# Patient Record
Sex: Female | Born: 1970 | Race: White | Hispanic: No | Smoking: Never smoker
Health system: Southern US, Community
[De-identification: ages and names within clinical notes are randomized; demographics above are authoritative.]

## PROBLEM LIST (undated history)

## (undated) DIAGNOSIS — E119 Type 2 diabetes mellitus without complications: Secondary | ICD-10-CM

## (undated) DIAGNOSIS — I10 Essential (primary) hypertension: Secondary | ICD-10-CM

## (undated) DIAGNOSIS — C140 Malignant neoplasm of pharynx, unspecified: Secondary | ICD-10-CM

## (undated) DIAGNOSIS — F419 Anxiety disorder, unspecified: Secondary | ICD-10-CM

## (undated) DIAGNOSIS — F329 Major depressive disorder, single episode, unspecified: Secondary | ICD-10-CM

## (undated) DIAGNOSIS — D499 Neoplasm of unspecified behavior of unspecified site: Secondary | ICD-10-CM

## (undated) DIAGNOSIS — F32A Depression, unspecified: Secondary | ICD-10-CM

## (undated) DIAGNOSIS — J45909 Unspecified asthma, uncomplicated: Secondary | ICD-10-CM

## (undated) HISTORY — DX: Neoplasm of unspecified behavior of unspecified site: D49.9

## (undated) HISTORY — DX: Essential (primary) hypertension: I10

## (undated) HISTORY — DX: Type 2 diabetes mellitus without complications: E11.9

## (undated) HISTORY — PX: TUBAL LIGATION: SHX77

## (undated) HISTORY — PX: CHOLECYSTECTOMY: SHX55

## (undated) HISTORY — DX: Malignant neoplasm of pharynx, unspecified: C14.0

---

## 2010-11-06 ENCOUNTER — Encounter: Payer: Self-pay | Admitting: *Deleted

## 2010-11-06 ENCOUNTER — Emergency Department (HOSPITAL_COMMUNITY)
Admission: EM | Admit: 2010-11-06 | Discharge: 2010-11-07 | Disposition: A | Discharge status: Home / Self Care | Payer: PRIVATE HEALTH INSURANCE | Attending: Emergency Medicine | Admitting: Emergency Medicine

## 2010-11-06 DIAGNOSIS — F329 Major depressive disorder, single episode, unspecified: Secondary | ICD-10-CM

## 2010-11-06 DIAGNOSIS — F3289 Other specified depressive episodes: Secondary | ICD-10-CM | POA: Insufficient documentation

## 2010-11-06 HISTORY — DX: Depression, unspecified: F32.A

## 2010-11-06 HISTORY — DX: Anxiety disorder, unspecified: F41.9

## 2010-11-06 HISTORY — DX: Major depressive disorder, single episode, unspecified: F32.9

## 2010-11-06 NOTE — ED Notes (Signed)
Pt states she has depression and hasn't been taking her medications. Pt states she cries all the time and thinks about suicide. When asked if pt had a plan, she said she would overdose on pills or jump out of the car while it was going down the road.

## 2010-11-06 NOTE — ED Notes (Signed)
Security wanded pt and pt's friend, pt not able to change clothes into paper scrubs due to pt's size, sitter at bedside

## 2010-11-06 NOTE — ED Notes (Signed)
Pt. Stopped taking her meds, pt states "does not feel like it" when asked why pt stopped her meds; states "crazy thoughts"; pt's plan for suicide is "take overdose or throw myself into traffic", pt has hx anxiety, depression and suicide attempt; pt. Admits to having " a lot of stress" and pt's friend states pt with recent break up with her boyfriend

## 2010-11-07 ENCOUNTER — Encounter (HOSPITAL_COMMUNITY): Payer: Self-pay | Admitting: *Deleted

## 2010-11-07 DIAGNOSIS — F329 Major depressive disorder, single episode, unspecified: Secondary | ICD-10-CM | POA: Insufficient documentation

## 2010-11-07 DIAGNOSIS — F332 Major depressive disorder, recurrent severe without psychotic features: Secondary | ICD-10-CM | POA: Insufficient documentation

## 2010-11-07 LAB — CBC
Platelets: 256 10*3/uL (ref 150–400)
RBC: 4.2 MIL/uL (ref 3.87–5.11)
WBC: 8.9 10*3/uL (ref 4.0–10.5)

## 2010-11-07 LAB — DIFFERENTIAL
Eosinophils Absolute: 0.2 10*3/uL (ref 0.0–0.7)
Lymphocytes Relative: 34 % (ref 12–46)
Lymphs Abs: 3 10*3/uL (ref 0.7–4.0)
Neutro Abs: 5.3 10*3/uL (ref 1.7–7.7)
Neutrophils Relative %: 59 % (ref 43–77)

## 2010-11-07 LAB — BASIC METABOLIC PANEL
CO2: 26 mEq/L (ref 19–32)
GFR calc non Af Amer: >60 mL/min (ref >60)
Glucose, Bld: 135 mg/dL — ABNORMAL HIGH (ref 70–99)
Potassium: 3.6 mEq/L (ref 3.5–5.1)
Sodium: 137 mEq/L (ref 135–145)

## 2010-11-07 LAB — RAPID URINE DRUG SCREEN, HOSP PERFORMED
Barbiturates: NOT DETECTED
Benzodiazepines: NOT DETECTED
Cocaine: NOT DETECTED

## 2010-11-07 NOTE — ED Provider Notes (Signed)
Pt seen by [facility] and evaluated by Dr. Henderson Cloud with [facility]. He feels the patient is safe for discharge and we can reverse her commitment papers. Will refill Klonipin for short term and advise outpatient followup.   Charles B. Bernette Mayers, MD 11/07/10 1124

## 2010-11-07 NOTE — ED Notes (Signed)
Patient asked for a cup of ice water 

## 2010-11-07 NOTE — ED Notes (Signed)
Pt having [facility] psych consult at present

## 2010-11-07 NOTE — ED Notes (Signed)
Pt to be discharged home past IVC papers refersed

## 2010-11-07 NOTE — ED Notes (Signed)
Patient was given toothbrush, toothpaste comb and soap

## 2010-11-07 NOTE — ED Notes (Signed)
Pt has family members that would like to know where pt is being transferred too; [facility] [phone] and [facility] [phone]

## 2010-11-07 NOTE — ED Notes (Signed)
Pt. Insisting on leaving, informed pt that she can not leave and that we have commitment papers on her, pt states "ya'll don't understand, I have to go to work in the morning"; pt explained again that she can not leave due to her being suicidual

## 2010-11-07 NOTE — ED Notes (Signed)
Info faxed to soc for [facility] psych

## 2010-11-07 NOTE — ED Provider Notes (Signed)
History     Chief Complaint  Patient presents with  . Psychiatric Evaluation    Pt feels depressed. Pt states she cries all of the time. Pt states she has depression and hasn't been taking her meds. Pt just started back taking her psych meds.   HPI Comments: Pt has had problems c depression for long time;  Stopped her rx for Klonopin one week ago  Patient is a 40 y.o. female presenting with altered mental status. The history is provided by the patient.  Altered Mental Status This is a chronic problem. The current episode started more than 1 week ago. The problem occurs constantly. The problem has been gradually worsening. The symptoms are aggravated by nothing. The symptoms are relieved by nothing. She has tried nothing for the symptoms.    Past Medical History  Diagnosis Date  . Depression     Pt has had suicide ideations in the past.  . Herpes simplex   . Anxiety     Past Surgical History  Procedure Date  . Cholecystectomy   . Tubal ligation     Family History  Problem Relation Age of Onset  . Hypertension Other   . Diabetes Daughter   . Asthma Daughter     History  Substance Use Topics  . Smoking status: Never Smoker   . Smokeless tobacco: Not on file  . Alcohol Use: No    OB History    Grav Para Term Preterm Abortions TAB SAB Ect Mult Living   2 2              Review of Systems  Psychiatric/Behavioral: Positive for altered mental status.  All other systems reviewed and are negative.    Physical Exam  BP 149/74  Pulse 77  Temp(Src) 98.1 F (36.7 C) (Oral)  Resp 20  Ht 5\' 4"  (1.626 m)  Wt 320 lb (145.151 kg)  BMI 54.93 kg/m2  SpO2 99%  LMP 10/31/2010  Physical Exam  Constitutional: She is oriented to person, place, and time. She appears well-developed and well-nourished.  HENT:  Head: Normocephalic and atraumatic.  Eyes: Conjunctivae and EOM are normal. Pupils are equal, round, and reactive to light.  Neck: Normal range of motion. Neck supple.   Cardiovascular: Normal rate and regular rhythm.   Pulmonary/Chest: Effort normal and breath sounds normal.  Abdominal: Soft. Bowel sounds are normal.  Musculoskeletal: Normal range of motion.  Neurological: She is alert and oriented to person, place, and time.  Skin: Skin is warm and dry.  Psychiatric: Her speech is normal. Judgment and thought content normal. Her mood appears anxious. Cognition and memory are normal. She exhibits a depressed mood. She is inattentive.    ED Course  Procedures  Labs, IVC paperwork, [facility] consult     Donnetta Hutching, MD 11/07/10 [phone]

## 2010-11-07 NOTE — ED Notes (Signed)
Sitter remains at bedside

## 2010-11-07 NOTE — ED Notes (Signed)
Pt being eval by [facility] 

## 2010-11-07 NOTE — ED Notes (Signed)
Patient needed to [place]  the bathroom. Offered something to drink

## 2010-11-07 NOTE — ED Notes (Signed)
Pt wanting to leave AMA, EDP notified and [facility] informed and at bedside, commitment papers filled and signed, [place] [employer] called due to pt wanting to leave and that pt is form [place]

## 2010-11-07 NOTE — ED Notes (Signed)
Pt waiting to be eval by [facility]. Sitter at bedside

## 2010-11-09 ENCOUNTER — Inpatient Hospital Stay (HOSPITAL_COMMUNITY)
Admission: RE | Admit: 2010-11-09 | Discharge: 2010-11-12 | DRG: 885 | Disposition: A | Discharge status: Home / Self Care | Payer: PRIVATE HEALTH INSURANCE | Attending: Psychiatry | Admitting: Psychiatry

## 2010-11-09 DIAGNOSIS — IMO0002 Reserved for concepts with insufficient information to code with codable children: Secondary | ICD-10-CM

## 2010-11-09 DIAGNOSIS — F411 Generalized anxiety disorder: Secondary | ICD-10-CM

## 2010-11-09 DIAGNOSIS — Z818 Family history of other mental and behavioral disorders: Secondary | ICD-10-CM

## 2010-11-09 DIAGNOSIS — N159 Renal tubulo-interstitial disease, unspecified: Secondary | ICD-10-CM

## 2010-11-09 DIAGNOSIS — Z6379 Other stressful life events affecting family and household: Secondary | ICD-10-CM

## 2010-11-09 DIAGNOSIS — K219 Gastro-esophageal reflux disease without esophagitis: Secondary | ICD-10-CM

## 2010-11-09 DIAGNOSIS — R45851 Suicidal ideations: Secondary | ICD-10-CM

## 2010-11-09 DIAGNOSIS — G8929 Other chronic pain: Secondary | ICD-10-CM

## 2010-11-09 DIAGNOSIS — F332 Major depressive disorder, recurrent severe without psychotic features: Principal | ICD-10-CM

## 2010-11-09 DIAGNOSIS — M25569 Pain in unspecified knee: Secondary | ICD-10-CM

## 2010-11-10 DIAGNOSIS — F339 Major depressive disorder, recurrent, unspecified: Secondary | ICD-10-CM

## 2010-11-10 DIAGNOSIS — F411 Generalized anxiety disorder: Secondary | ICD-10-CM

## 2010-11-10 LAB — URINALYSIS, ROUTINE W REFLEX MICROSCOPIC
Bilirubin Urine: NEGATIVE
Glucose, UA: NEGATIVE mg/dL
Ketones, ur: NEGATIVE mg/dL
Nitrite: NEGATIVE
Protein, ur: NEGATIVE mg/dL
Specific Gravity, Urine: 1.015 (ref 1.005–1.030)
Urobilinogen, UA: 0.2 mg/dL (ref 0.0–1.0)
pH: 7.5 (ref 5.0–8.0)

## 2010-11-10 LAB — URINE MICROSCOPIC-ADD ON
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
## 2010-11-16 NOTE — Discharge Summary (Signed)
Norma Horton, BUTNER NO.:  [id number]  MEDICAL RECORD NO.:  [id number]  LOCATION:  0502                          FACILITY:  BH  PHYSICIAN:  Franchot Gallo, MD     DATE OF BIRTH:  1971-01-14  DATE OF ADMISSION:  11/09/2010 DATE OF DISCHARGE:  11/12/2010                              DISCHARGE SUMMARY   REASON FOR ADMISSION:  The patient is a 40 year old divorced white female who was admitted to Behavioral Health for evaluation of depression as well as anxiety symptoms and suicidal ideations.  The patient reported that she was having difficulty initiating and maintaining sleep as well as severe feelings of sadness, anhedonia and depressed mood.  She stated that her depression began to worsen approximately 7 months ago when she began to date "a new boyfriend" who moved in with her 2 months ago.  The patient states that they have fought and argued since this.  At time of admission she described her depression on a scale of 1-10 as a 10.  SUMMARY OF THE HOSPITALIZATION:  The patient was admitted to [facility] on November 09, 2010 and was first seen by this provider on November 10, 2010.  At that time the patient stated that she was having difficulty initiating and maintaining sleep as well as decreased appetite.  She rated her depression on a scale of 1-10 as a 10.  She reported some vague suicidal ideations, but no plan or intent.  She denied any homicidal ideations as well as any auditory or visual hallucinations or delusional thinking.  She also stated that her anxiety symptoms were moderate and on a scale of 1-10 was a 5.  She was given the diagnosis of major depressive disorder-recurrent-severe as well as a generalized anxiety disorder.  The patient was started on the medication Cymbalta 60 mg p.o. q.a.m. for depression and to help with her pain issues.  She was also continued on Klonopin at 1 mg p.o. q. p.m. for anxiety.  The patient was next seen by this  provider on 11/11/10 and stated that she was sleeping well without difficulty and reported some decrease in appetite.  She reported improvement in her depressive symptoms and described them as moderate and on a scale of 1-10 rated them as a 4. She denied any suicidal or homicidal ideations as well as any auditory or visual hallucinations.  She stated her anxiety symptoms were moderate and on a scale of 1-10 was a 5.  The patient was continued on her medications.  The patient was next seen by this provider on 11/12/10 and stated that she was sleeping well without difficulty and described her appetite is fair to good.  She stated her depression had improved to where on a scale of 1-10 it was now at 2 and she adamantly denied any suicidal or homicidal ideations.  She also denied any auditory or visual hallucinations or delusional thinking and stated her anxiety symptoms was very mild and on a scale of 1-10 was a 1.  She denied any medication related side effects and requested discharge.  The patient was discharged outpatient follow-up as requested.  SIGNIFICANT LABORATORIES:  At time of admission  the patient's blood glucose was found to be 135.  The patient's hemoglobin was 11.9.  DISCHARGE MEDICATIONS: 1. Duloxetine 60 mg p.o. q.a.m. for depression and pain. 2. Klonopin 1 mg p.o. daily for anxiety. 3. Hydroxyzine 50 mg p.o. q.h.s. for sleep. 4. Amoxicillin 500 mg tablets, 2 tablets p.o. b.i.d. for urinary tract     infection. 5. Protonix 40 mg p.o. q.a.m. for GE reflux disease.  DISCHARGE DIAGNOSIS:   Axis I -  Major depressive disorder-recurrent-mild at time of discharge. Generalized anxiety disorder-currently under good control at time of discharge. Axis II - Deferred. Axis III - GE reflux disease. 1. Chronic lower back pain. 2. Chronic knee pain. 3. Kidney infection. 4. Status post cholecystectomy. 5. Status post tubal ligation. 6. Status post arthroscopic procedure of left  knee. Axis IV - Limited primary support system.  Discord boyfriend.  Serious chronic mental illness. Axis V - GAF at time of admission approximately 35.  Highest GAF in past year approximately 55-60.  GAF at time of discharge approximately 60.  CONDITION ON DISCHARGE:  The patient was alert and oriented times 3. She was friendly and cooperative with this provider.  Speech was appropriate in terms of rate and volume.  Mood appeared mildly depressed.  Affect was slightly constricted.  Thoughts-the patient adamantly denied any suicidal or homicidal ideations as well as any auditory or visual hallucinations or delusional thinking.  The patient's anxiety was under good control with some mild symptoms remaining.  She denied any medication related side effects.  Judgment and insight both appeared fair to good.  DISCHARGE INSTRUCTIONS:  The patient is to follow-up with Florencia Reasons at [facility] in [place], as well as Dr. Lolly Mustache her psychiatrist on Wednesday November 26, 2010 at 3:00 p.m.    ___________________________________ Franchot Gallo, MD     RR/MEDQ  D:  11/15/2010  T:  11/16/2010  Job:  [id number]  Electronically Signed by Franchot Gallo MD on 11/16/2010 12:40:49 PM

## 2010-11-18 ENCOUNTER — Ambulatory Visit (HOSPITAL_COMMUNITY): Payer: PRIVATE HEALTH INSURANCE | Admitting: Psychiatry

## 2010-11-24 ENCOUNTER — Ambulatory Visit (INDEPENDENT_AMBULATORY_CARE_PROVIDER_SITE_OTHER): Payer: PRIVATE HEALTH INSURANCE | Admitting: Psychiatry

## 2010-11-24 DIAGNOSIS — F332 Major depressive disorder, recurrent severe without psychotic features: Secondary | ICD-10-CM

## 2010-11-24 DIAGNOSIS — F411 Generalized anxiety disorder: Secondary | ICD-10-CM

## 2010-12-06 ENCOUNTER — Encounter (HOSPITAL_COMMUNITY): Payer: PRIVATE HEALTH INSURANCE | Admitting: Psychiatry

## 2010-12-07 ENCOUNTER — Ambulatory Visit (HOSPITAL_COMMUNITY): Payer: PRIVATE HEALTH INSURANCE | Admitting: Psychiatry

## 2011-01-18 ENCOUNTER — Ambulatory Visit (INDEPENDENT_AMBULATORY_CARE_PROVIDER_SITE_OTHER): Payer: PRIVATE HEALTH INSURANCE | Admitting: Psychiatry

## 2011-01-18 DIAGNOSIS — F331 Major depressive disorder, recurrent, moderate: Secondary | ICD-10-CM
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
## 2011-02-15 ENCOUNTER — Encounter (HOSPITAL_COMMUNITY): Payer: PRIVATE HEALTH INSURANCE | Admitting: Psychiatry

## 2011-04-15 ENCOUNTER — Other Ambulatory Visit (HOSPITAL_COMMUNITY): Payer: Self-pay | Admitting: Psychiatry

## 2013-12-13 ENCOUNTER — Other Ambulatory Visit (HOSPITAL_COMMUNITY): Payer: Self-pay | Admitting: *Deleted

## 2013-12-13 DIAGNOSIS — Z1231 Encounter for screening mammogram for malignant neoplasm of breast: Secondary | ICD-10-CM

## 2013-12-23 ENCOUNTER — Ambulatory Visit (HOSPITAL_COMMUNITY)
Admission: RE | Admit: 2013-12-23 | Discharge: 2013-12-23 | Disposition: A | Discharge status: Home / Self Care | Payer: PRIVATE HEALTH INSURANCE | Source: Ambulatory Visit | Attending: *Deleted | Admitting: *Deleted

## 2013-12-23 DIAGNOSIS — Z1231 Encounter for screening mammogram for malignant neoplasm of breast: Secondary | ICD-10-CM

## 2014-03-03 ENCOUNTER — Encounter (HOSPITAL_COMMUNITY): Payer: Self-pay | Admitting: *Deleted

## 2015-11-05 ENCOUNTER — Other Ambulatory Visit: Payer: Self-pay | Admitting: Family

## 2015-11-05 ENCOUNTER — Other Ambulatory Visit (HOSPITAL_COMMUNITY): Payer: Self-pay | Admitting: Family

## 2015-11-05 DIAGNOSIS — E010 Iodine-deficiency related diffuse (endemic) goiter: Secondary | ICD-10-CM

## 2015-11-19 ENCOUNTER — Ambulatory Visit (HOSPITAL_COMMUNITY)
Admission: RE | Admit: 2015-11-19 | Discharge: 2015-11-19 | Disposition: A | Discharge status: Home / Self Care | Payer: Self-pay | Source: Ambulatory Visit | Attending: Family | Admitting: Family

## 2015-11-19 DIAGNOSIS — E010 Iodine-deficiency related diffuse (endemic) goiter: Secondary | ICD-10-CM

## 2015-11-19 DIAGNOSIS — E049 Nontoxic goiter, unspecified: Secondary | ICD-10-CM | POA: Insufficient documentation

## 2015-11-23 ENCOUNTER — Other Ambulatory Visit (HOSPITAL_COMMUNITY): Payer: Self-pay | Admitting: Family

## 2015-11-23 DIAGNOSIS — E041 Nontoxic single thyroid nodule: Secondary | ICD-10-CM

## 2015-12-02 ENCOUNTER — Ambulatory Visit (HOSPITAL_COMMUNITY): Admission: RE | Admit: 2015-12-02 | Payer: PRIVATE HEALTH INSURANCE | Source: Ambulatory Visit

## 2015-12-11 ENCOUNTER — Encounter (HOSPITAL_COMMUNITY): Payer: Self-pay

## 2015-12-11 ENCOUNTER — Ambulatory Visit (HOSPITAL_COMMUNITY)
Admission: RE | Admit: 2015-12-11 | Discharge: 2015-12-11 | Disposition: A | Discharge status: Home / Self Care | Payer: Self-pay | Source: Ambulatory Visit | Attending: Family | Admitting: Family

## 2015-12-11 DIAGNOSIS — E041 Nontoxic single thyroid nodule: Secondary | ICD-10-CM | POA: Insufficient documentation

## 2015-12-11 NOTE — Progress Notes (Signed)
Radiology Procedure Note  Uncomplicated US guided FNA biopsy of the right thyroid nodule.  JWatts MD

## 2015-12-11 NOTE — Discharge Instructions (Signed)

## 2015-12-21 ENCOUNTER — Ambulatory Visit (INDEPENDENT_AMBULATORY_CARE_PROVIDER_SITE_OTHER): Payer: PRIVATE HEALTH INSURANCE | Admitting: Otolaryngology

## 2016-09-26 ENCOUNTER — Emergency Department (HOSPITAL_COMMUNITY)
Admission: EM | Admit: 2016-09-26 | Discharge: 2016-09-26 | Disposition: A | Discharge status: Home / Self Care | Payer: PRIVATE HEALTH INSURANCE

## 2016-09-27 ENCOUNTER — Encounter (HOSPITAL_COMMUNITY): Payer: Self-pay | Admitting: Emergency Medicine

## 2016-09-27 ENCOUNTER — Emergency Department (HOSPITAL_COMMUNITY)
Admission: EM | Admit: 2016-09-27 | Discharge: 2016-09-27 | Disposition: A | Discharge status: Home / Self Care | Payer: PRIVATE HEALTH INSURANCE | Attending: Emergency Medicine | Admitting: Emergency Medicine

## 2016-09-27 DIAGNOSIS — Z79899 Other long term (current) drug therapy: Secondary | ICD-10-CM | POA: Insufficient documentation

## 2016-09-27 DIAGNOSIS — Z9049 Acquired absence of other specified parts of digestive tract: Secondary | ICD-10-CM | POA: Insufficient documentation

## 2016-09-27 DIAGNOSIS — L237 Allergic contact dermatitis due to plants, except food: Secondary | ICD-10-CM | POA: Insufficient documentation

## 2016-09-27 DIAGNOSIS — L255 Unspecified contact dermatitis due to plants, except food: Secondary | ICD-10-CM

## 2016-09-27 LAB — CBG MONITORING, ED: Glucose-Capillary: 142 mg/dL — ABNORMAL HIGH (ref 65–99)

## 2016-09-27 MED ORDER — DIPHENHYDRAMINE HCL 25 MG PO CAPS
25.0000 mg | ORAL_CAPSULE | Freq: Once | ORAL | Status: AC
  Administered 2016-09-27: 25 mg via ORAL
  Filled 2016-09-27: qty 1

## 2016-09-27 MED ORDER — PREDNISONE 10 MG PO TABS: ORAL_TABLET | ORAL | 0 refills | Status: DC

## 2016-09-27 MED ORDER — PREDNISONE 10 MG PO TABS
60.0000 mg | ORAL_TABLET | Freq: Once | ORAL | Status: AC
  Administered 2016-09-27: 60 mg via ORAL
  Filled 2016-09-27: qty 1

## 2016-09-27 MED ORDER — DIPHENHYDRAMINE HCL 25 MG PO TABS: 25.0000 mg | ORAL_TABLET | ORAL | 0 refills | Status: DC | PRN

## 2016-09-27 NOTE — ED Triage Notes (Signed)
Pt started having a rash to anterior AC area x 5 days ago. States is getting worse. Blistering areas noted. Area is red. Oozing at some sites.

## 2016-09-27 NOTE — Discharge Instructions (Signed)
Keep the area dry as possible.  Start the prednisone tomorrow. Check your blood sugars daily and stop the prednisone if your blood sugar becomes too high.

## 2016-09-27 NOTE — ED Notes (Signed)
Pt noted w/ a rash to her left AC. Raised fluid filled blisters noted.

## 2016-09-30 NOTE — ED Provider Notes (Signed)
[facility] DEPT Provider Note   CSN: [id number] Arrival date & time: 09/27/16  1742     History   Chief Complaint Chief Complaint  Patient presents with  . Rash    HPI Norma Horton is a 46 y.o. female.  HPI   Norma Horton is a 46 y.o. female who presents to the Emergency Department complaining of burning and itching rash to the left arm.  Symptoms present for 5 days.  Rash began after working outside in the yard.  States she has been applying Calamine lotion without relief.  Has developed blisters that rupture and ooze clear to yellow fluid  She denies fever, chills, swelling.    Past Medical History:  Diagnosis Date  . Anxiety   . Depression    Pt has had suicide ideations in the past.  . Herpes simplex     Patient Active Problem List   Diagnosis Date Noted  . Depression     Past Surgical History:  Procedure Laterality Date  . CHOLECYSTECTOMY    . TUBAL LIGATION      OB History    Gravida Para Term Preterm AB Living   2 2           SAB TAB Ectopic Multiple Live Births                   Home Medications    Prior to Admission medications   Medication Sig Start Date End Date Taking? Authorizing Provider  clonazePAM (KLONOPIN) 0.5 MG tablet Take 0.5 mg by mouth 3 (three) times daily as needed.      [provider]  diphenhydrAMINE (BENADRYL) 25 MG tablet Take 1 tablet (25 mg total) by mouth every 4 (four) hours as needed. As needed for itching 09/27/16   Rogerick Baldwin, PA-C  ibuprofen (ADVIL,MOTRIN) 800 MG tablet Take 800 mg by mouth every 8 (eight) hours as needed.      [provider]  pantoprazole (PROTONIX) 40 MG tablet Take 40 mg by mouth daily.      [provider]  predniSONE (DELTASONE) 10 MG tablet Take 6 tablets day one, 5 tablets day two, 4 tablets day three, 3 tablets day four, 2 tablets day five, then 1 tablet day six 09/27/16   Adenike Shidler, PA-C  valACYclovir (VALTREX) 500 MG tablet Take 500 mg by mouth  1 day or 1 dose.      [provider]    Family History Family History  Problem Relation Age of Onset  . Hypertension Other   . Diabetes Daughter   . Asthma Daughter     Social History Social History  Substance Use Topics  . Smoking status: Never Smoker  . Smokeless tobacco: Never Used  . Alcohol use No     Allergies   Fenoprofen calcium   Review of Systems Review of Systems  Constitutional: Negative for activity change, appetite change, chills and fever.  HENT: Negative for facial swelling, sore throat and trouble swallowing.   Respiratory: Negative for chest tightness, shortness of breath and wheezing.   Musculoskeletal: Negative for arthralgias, joint swelling, neck pain and neck stiffness.  Skin: Positive for rash. Negative for wound.  Neurological: Negative for dizziness, weakness, numbness and headaches.  All other systems reviewed and are negative.    Physical Exam Updated Vital Signs BP (!) 151/74 (BP Location: Right Arm)   Pulse 71   Temp 97.8 F (36.6 C) (Oral)   Resp 18   LMP 08/30/2016  SpO2 100%   Physical Exam  Constitutional: She is oriented to person, place, and time. She appears well-developed and well-nourished. No distress.  HENT:  Head: Normocephalic and atraumatic.  Mouth/Throat: Oropharynx is clear and moist.  Neck: Normal range of motion. Neck supple.  Cardiovascular: Normal rate, regular rhythm, normal heart sounds and intact distal pulses.   No murmur heard. Pulmonary/Chest: Effort normal and breath sounds normal. No respiratory distress.  Musculoskeletal: Normal range of motion. She exhibits no edema or tenderness.  Lymphadenopathy:    She has no cervical adenopathy.  Neurological: She is alert and oriented to person, place, and time. No sensory deficit. She exhibits normal muscle tone. Coordination normal.  Skin: Skin is warm. Rash noted. There is erythema.  Focal, erythematous lesions with vesicles to the left AC and  forearm.  No edema or pustules.  vesicles in linear pattern distribution.   Nursing note and vitals reviewed.    ED Treatments / Results  Labs (all labs ordered are listed, but only abnormal results are displayed) Labs Reviewed  CBG MONITORING, ED - Abnormal; Notable for the following:       Result Value   Glucose-Capillary 142 (*)    All other components within normal limits    EKG  EKG Interpretation None       Radiology No results found.  Procedures Procedures (including critical care time)  Medications Ordered in ED Medications  predniSONE (DELTASONE) tablet 60 mg (60 mg Oral Given 09/27/16 2004)  diphenhydrAMINE (BENADRYL) capsule 25 mg (25 mg Oral Given 09/27/16 2004)     Initial Impression / Assessment and Plan / ED Course  I have reviewed the triage vital signs and the nursing notes.  Pertinent labs & imaging results that were available during my care of the patient were reviewed by me and considered in my medical decision making (see chart for details).     Pt with rash to left arm that appears c/w plant dermatitis.  Agrees to tx with steroids and benadryl.    Final Clinical Impressions(s) / ED Diagnoses   Final diagnoses:  Plant dermatitis    New Prescriptions Discharge Medication List as of 09/27/2016  8:00 PM    START taking these medications   Details  diphenhydrAMINE (BENADRYL) 25 MG tablet Take 1 tablet (25 mg total) by mouth every 4 (four) hours as needed. As needed for itching, Starting Tue 09/27/2016, Print    predniSONE (DELTASONE) 10 MG tablet Take 6 tablets day one, 5 tablets day two, 4 tablets day three, 3 tablets day four, 2 tablets day five, then 1 tablet day six, Print         Kem Parkinson, PA-C 09/30/16 1235    Julianne Rice, MD 10/06/16 1427

## 2016-11-29 IMAGING — US US SOFT TISSUE HEAD/NECK
1 series · 14 of 25 positions shown · non-contrast
Comparison: None.

CLINICAL DATA: Thyromegaly

EXAM:
THYROID ULTRASOUND
TECHNIQUE: Ultrasound examination of the thyroid gland and adjacent soft
tissues was performed.

[Series 1: us soft tissue head/neck · 0.11mm/px · 14 of 51 slices shown]
[im 1/51]
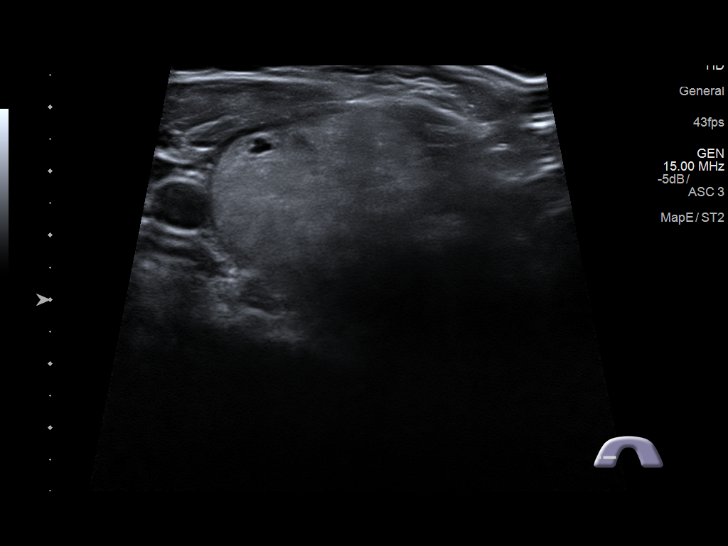
[im 5/51]
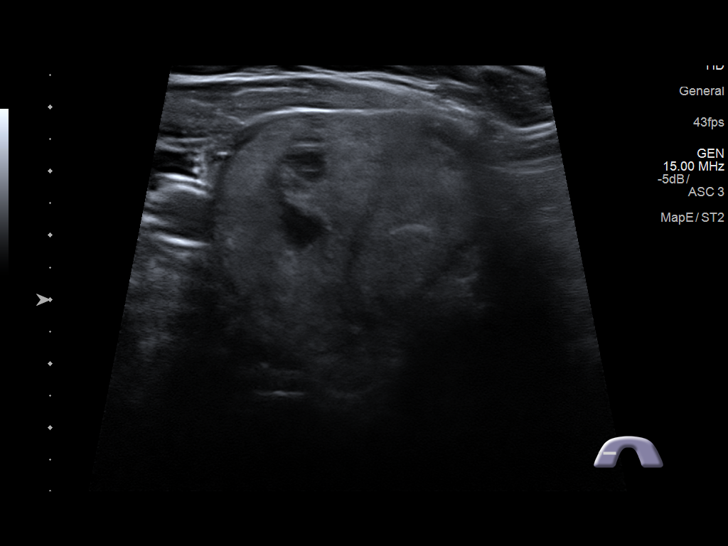
[im 9/51]
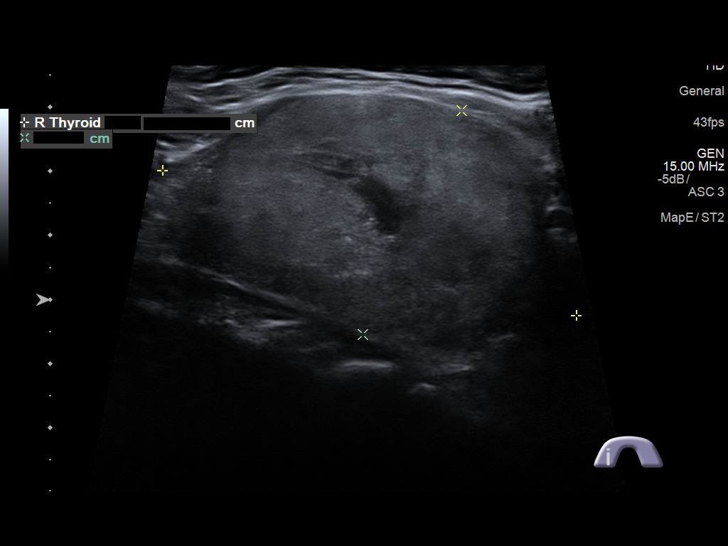
[im 13/51]
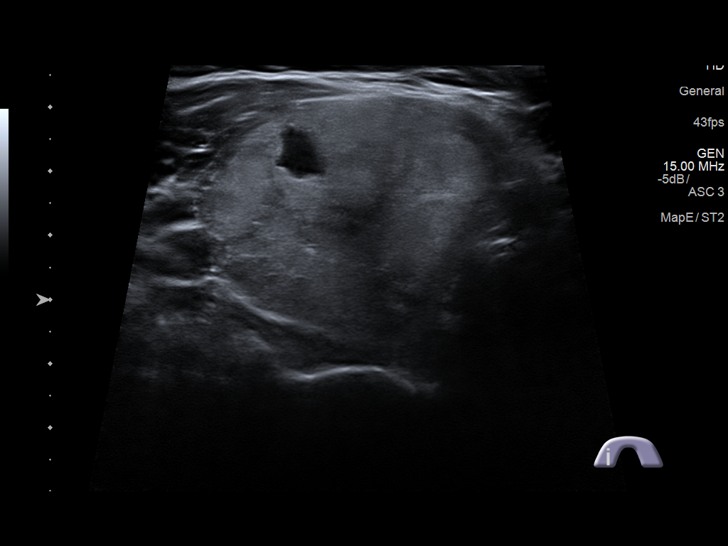
[im 17/51]
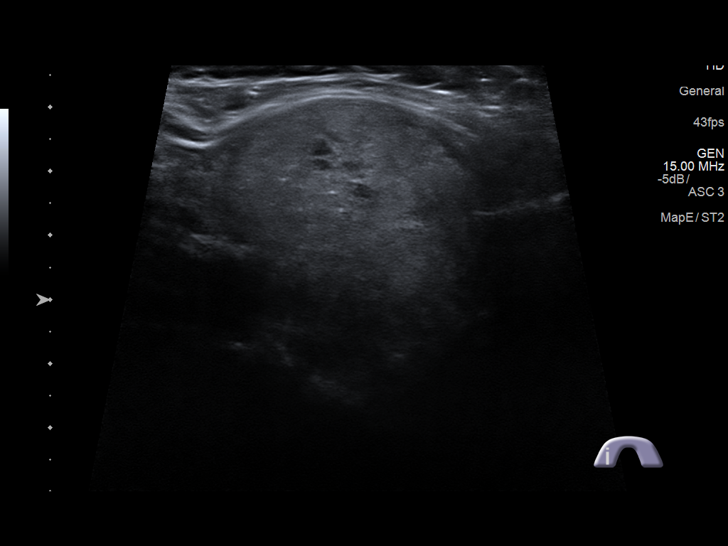
[im 19/51]
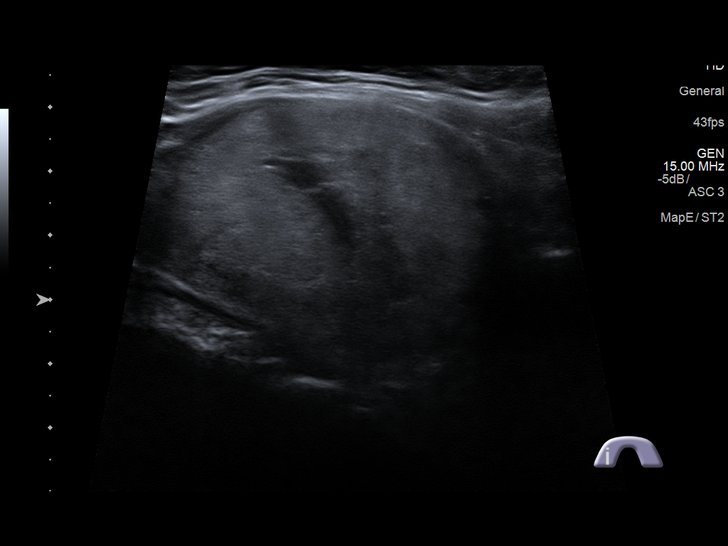
[im 23/51]
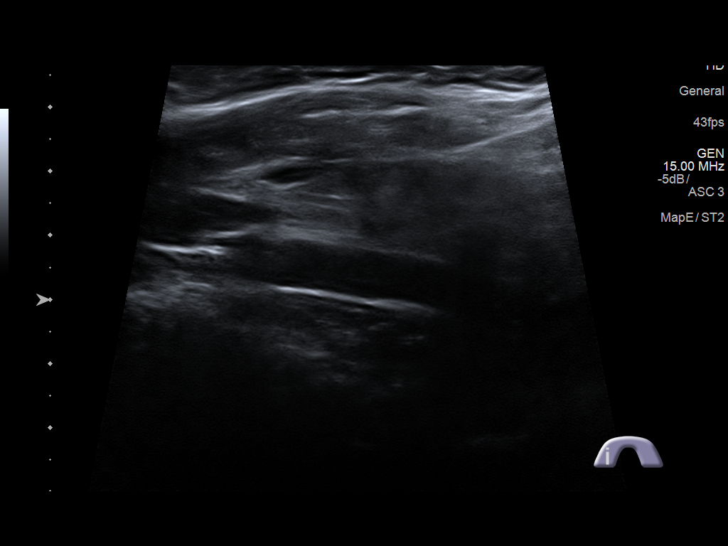
[im 28/51]
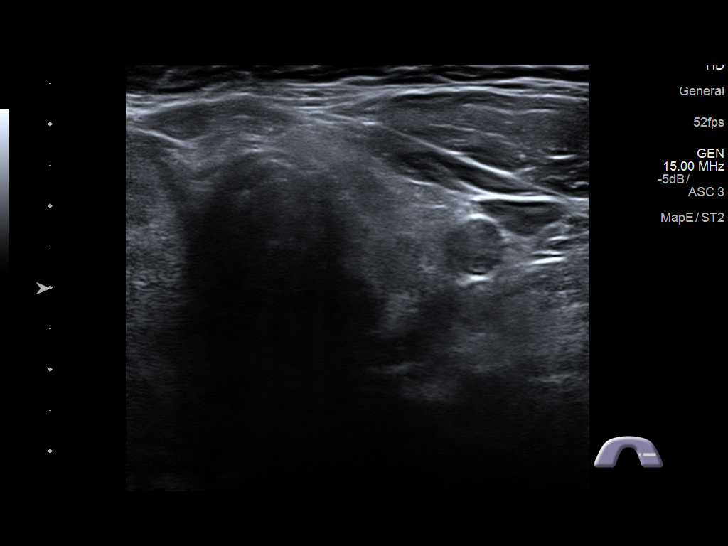
[im 32/51]
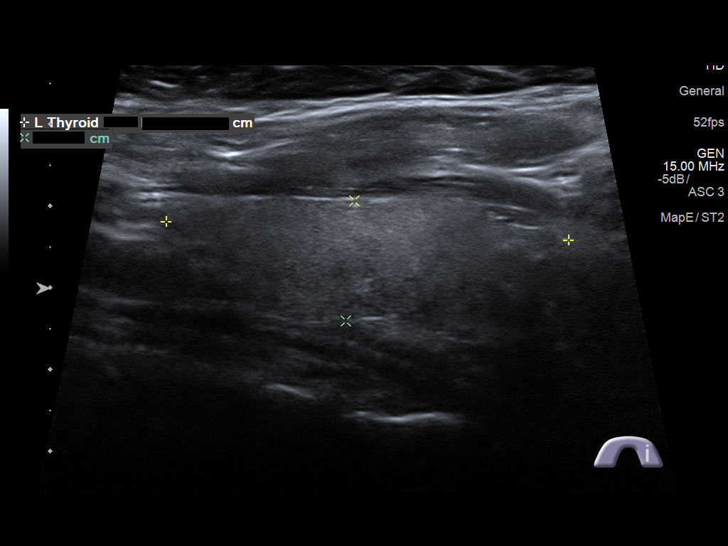
[im 34/51]
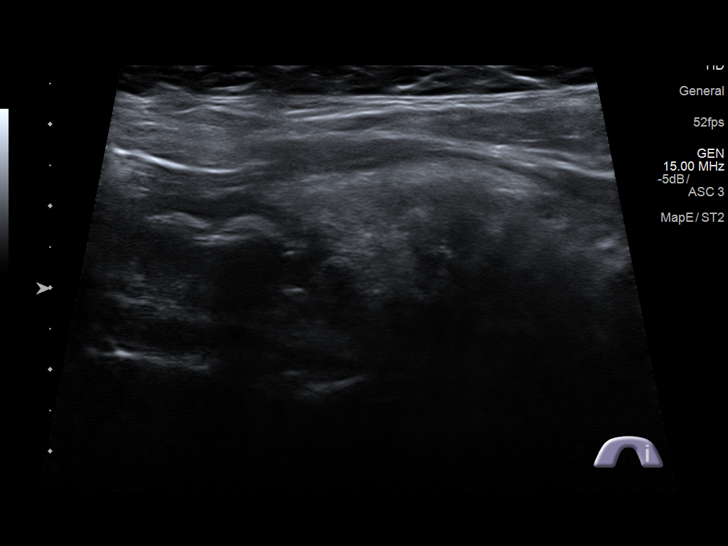
[im 38/51]
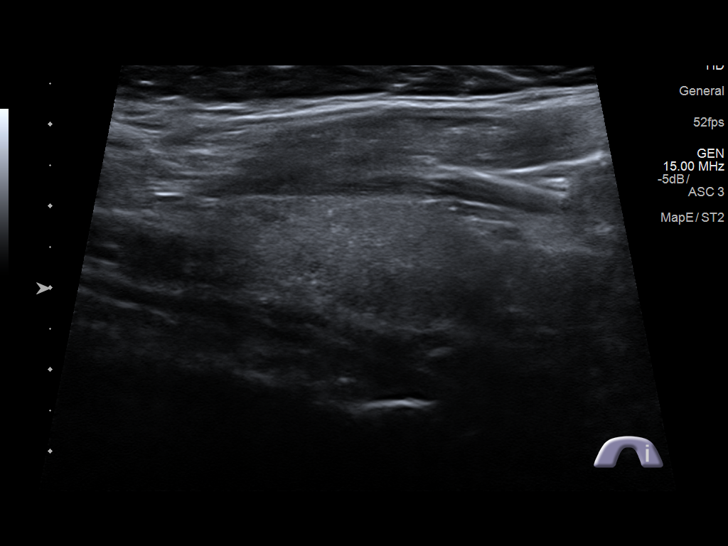
[im 42/51]
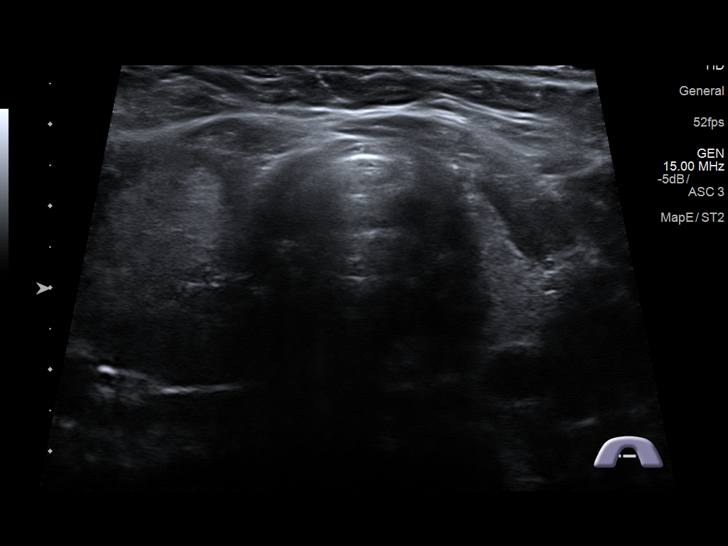
[im 46/51]
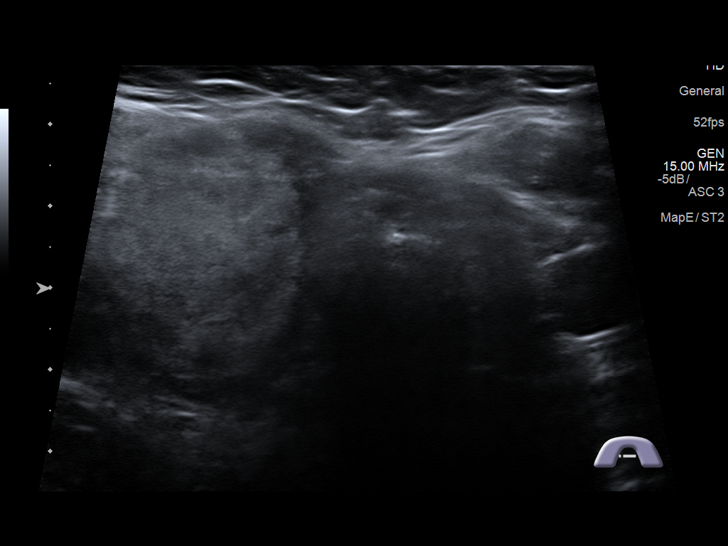
[im 51/51]
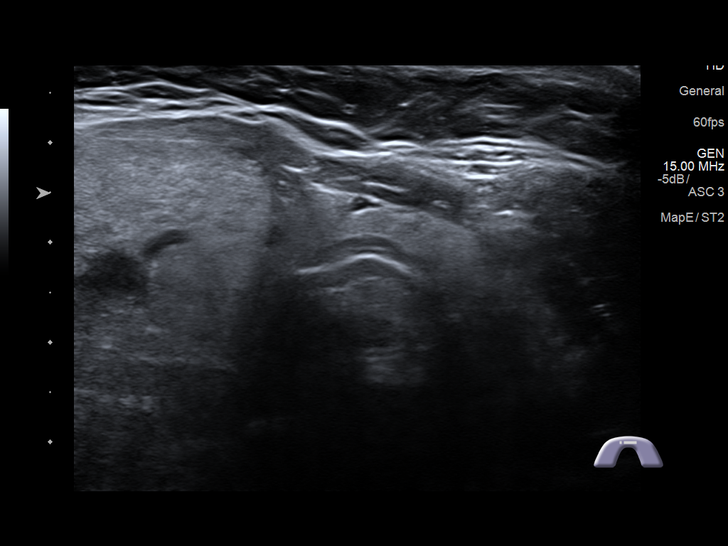

[14 of 25 positions shown; findings below may reference images not displayed]

FINDINGS: Right thyroid lobe

Measurements: 6.8 x 3.8 x 4.5 cm. Right upper pole predominately
solid nodule measures 4.9 x 3.7 x 4.5 cm

Left thyroid lobe

Measurements: 4.9 x 1.5 x 1.4 cm. Heterogeneous tissue without focal
nodule.

Isthmus

Thickness: 0.3 cm.  No nodules visualized.

Lymphadenopathy

None visualized.
IMPRESSION: Solitary large right lobe nodule measures 4.9 cm. Findings meet
consensus criteria for biopsy. Ultrasound-guided fine needle
aspiration should be considered, as per the consensus statement:
Management of Thyroid Nodules Detected at US: Society of
Radiologists in Ultrasound Consensus Conference Statement. Radiology

## 2016-12-21 IMAGING — US US THYROID BIOPSY
1 series · 13 of 13 positions shown · non-contrast
Comparison: Sonography 11/19/2015

INDICATION: Large thyroid nodule.

EXAM:
ULTRASOUND GUIDED NEEDLE ASPIRATE BIOPSY OF THE THYROID GLAND

[Series 1: us thyroid biopsy · 0.08mm/px · 13 acquisitions, 13 frames shown]
[im 1/13]
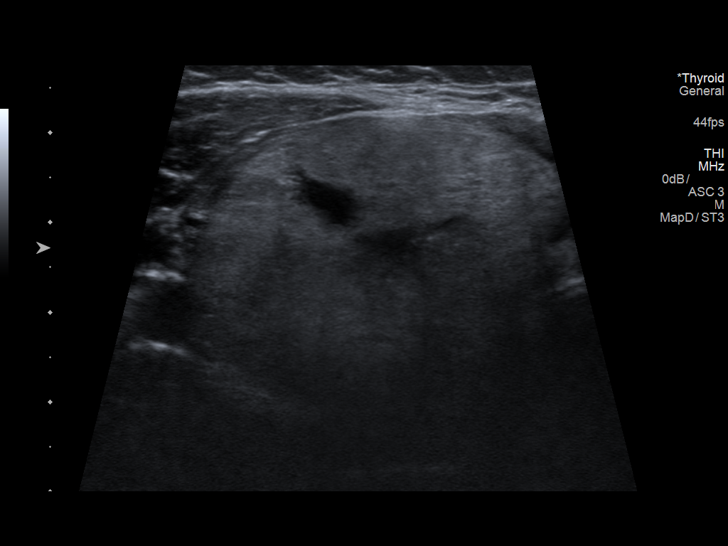
[im 2/13]
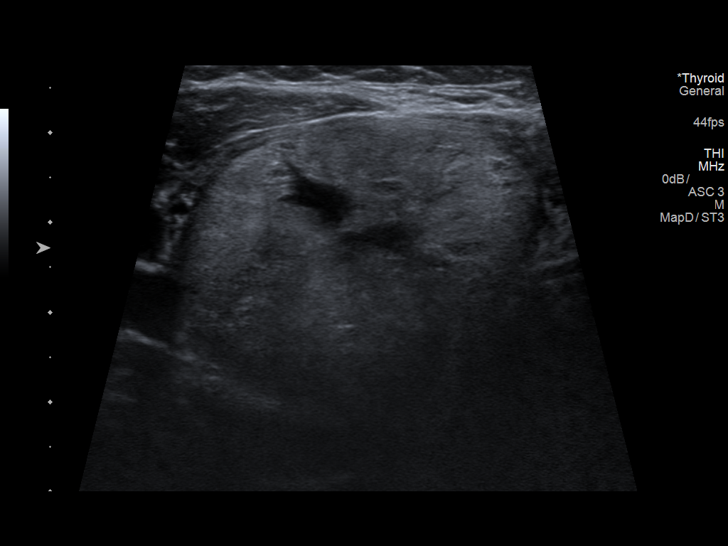
[im 3/13]
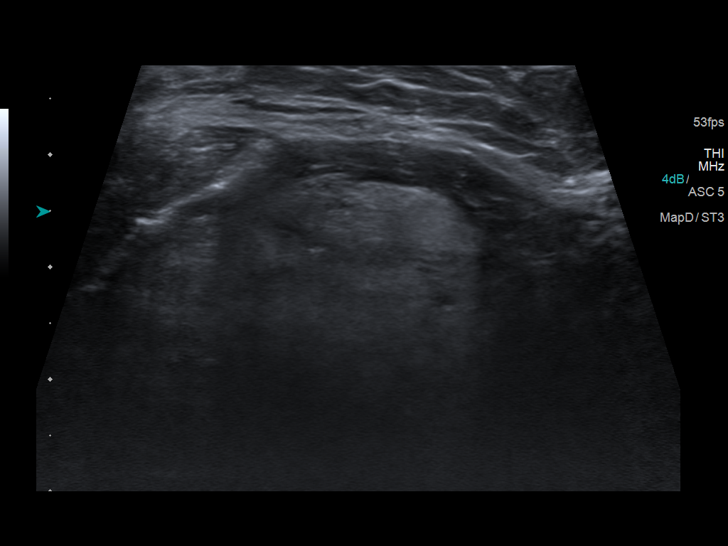
[im 4/13]
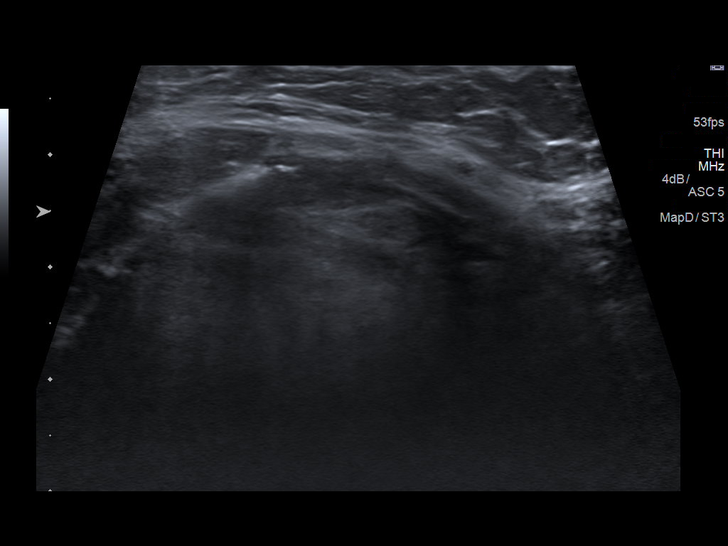
[im 5/13]
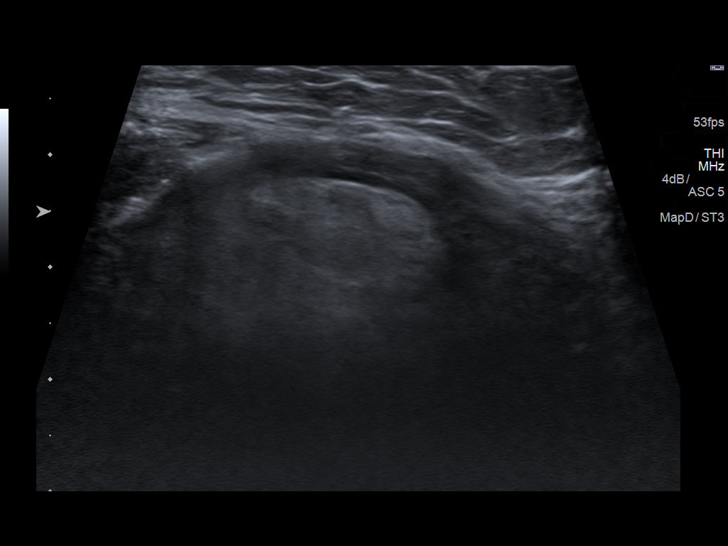
[im 6/13]
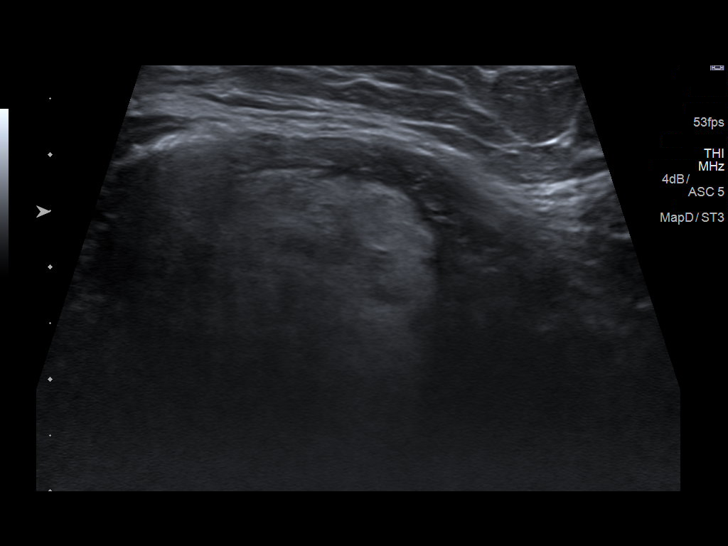
[im 7/13]
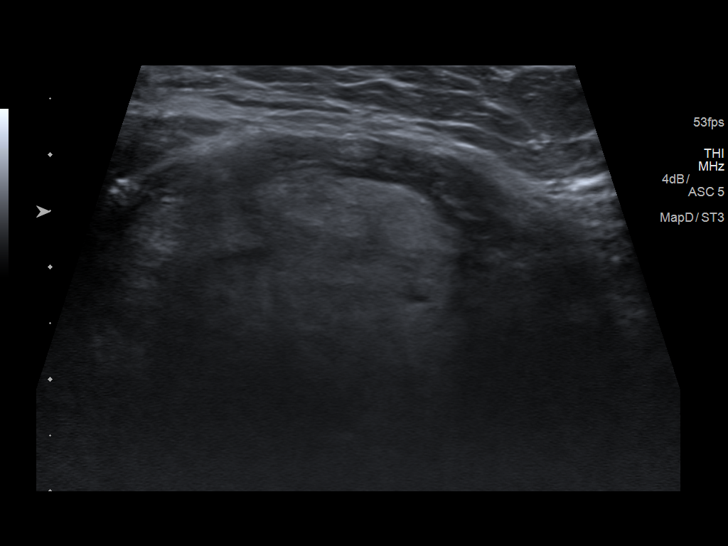
[im 8/13]
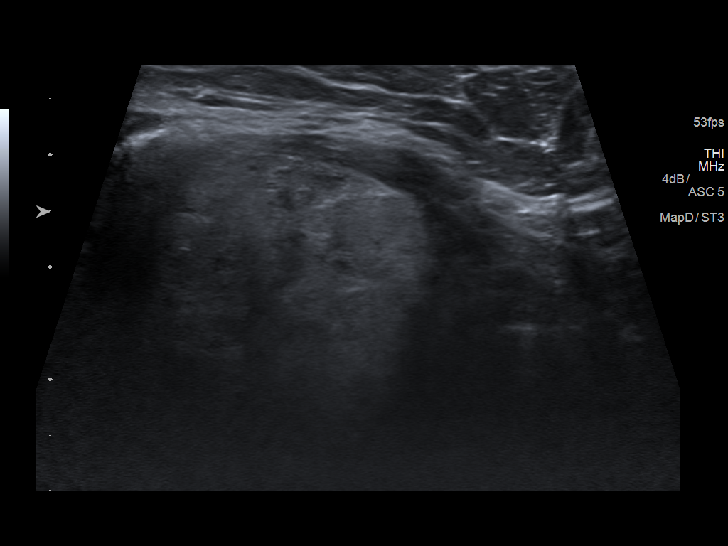
[im 9/13]
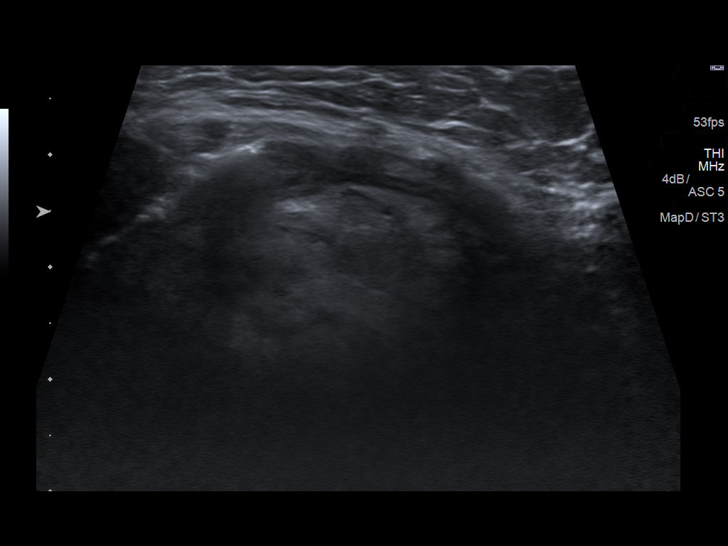
[im 10/13]
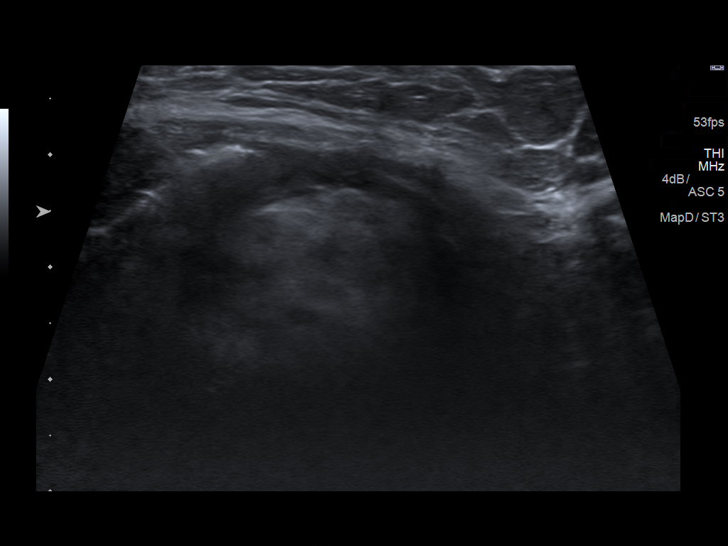
[im 11/13]
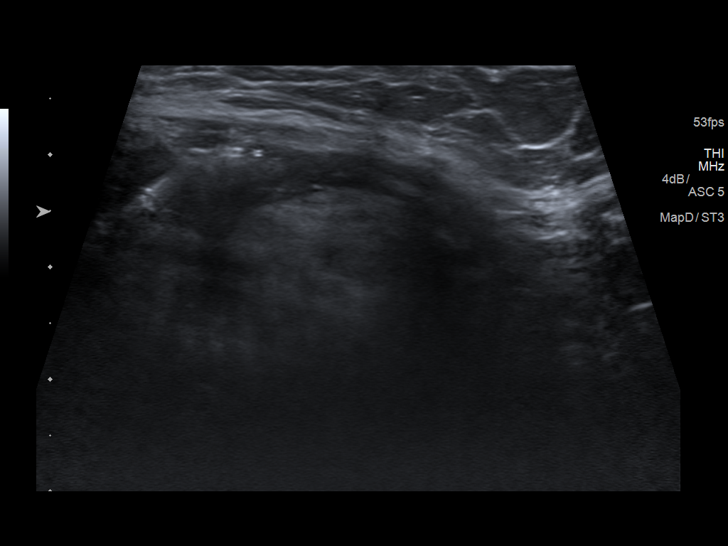
[im 12/13]
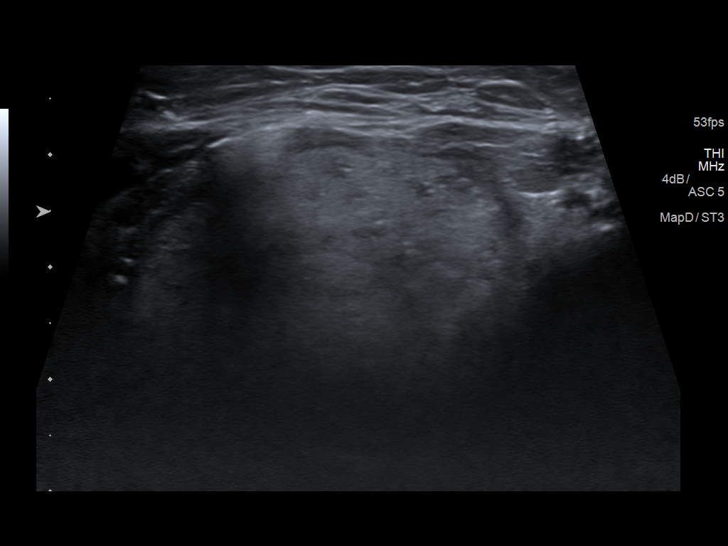
[im 13/13]
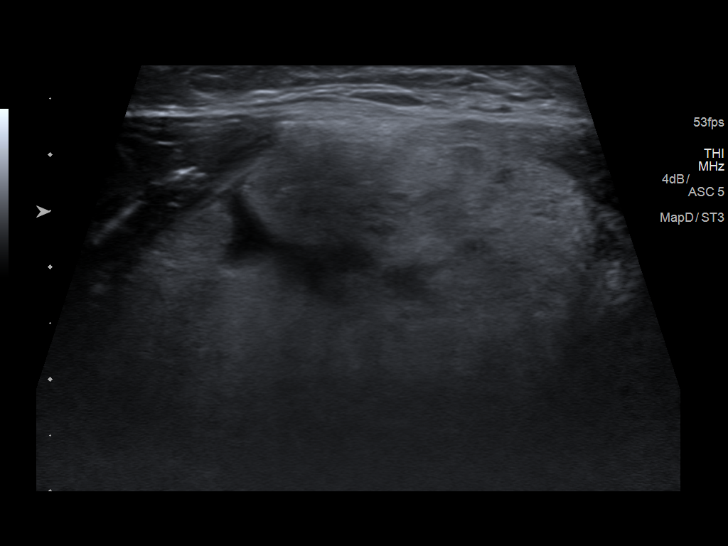

[13 of 13 positions shown; findings below may reference images not displayed]

MEDICATIONS:
1% lidocaine

COMPLICATIONS:
None immediate.

PROCEDURE:
Informed written consent was obtained from the patient after a
thorough discussion of the procedural risks, benefits and
alternatives. All questions were addressed. Maximal Sterile Barrier
Technique was utilized including mask, sterile gloves, sterile
drape, hand hygiene and skin antiseptic. A timeout was performed
prior to the initiation of the procedure.

Ultrasound was performed to localize and mark an adequate site for
the biopsy. The patient was then prepped and draped in a normal
sterile fashion. Local anesthesia was provided with 1% lidocaine.
Using direct ultrasound guidance, 3 passes were made using needles
into the nodule within the right lobe of the thyroid. Ultrasound was
used to confirm needle placements on all occasions. Specimens were
sent to Pathology for analysis.
IMPRESSION: Ultrasound guided needle aspirate biopsy performed of the right
thyroid nodule.

## 2021-11-16 ENCOUNTER — Ambulatory Visit (INDEPENDENT_AMBULATORY_CARE_PROVIDER_SITE_OTHER): Payer: 59 | Admitting: Nurse Practitioner

## 2021-11-16 ENCOUNTER — Encounter: Payer: Self-pay | Admitting: Nurse Practitioner

## 2021-11-16 VITALS — BP 154/96 | HR 69 | Ht 63.0 in | Wt 360.1 lb

## 2021-11-16 DIAGNOSIS — Z124 Encounter for screening for malignant neoplasm of cervix: Secondary | ICD-10-CM

## 2021-11-16 DIAGNOSIS — J4540 Moderate persistent asthma, uncomplicated: Secondary | ICD-10-CM | POA: Insufficient documentation

## 2021-11-16 DIAGNOSIS — Z23 Encounter for immunization: Secondary | ICD-10-CM

## 2021-11-16 DIAGNOSIS — E041 Nontoxic single thyroid nodule: Secondary | ICD-10-CM | POA: Insufficient documentation

## 2021-11-16 DIAGNOSIS — E1169 Type 2 diabetes mellitus with other specified complication: Secondary | ICD-10-CM

## 2021-11-16 DIAGNOSIS — Z1231 Encounter for screening mammogram for malignant neoplasm of breast: Secondary | ICD-10-CM

## 2021-11-16 DIAGNOSIS — I10 Essential (primary) hypertension: Secondary | ICD-10-CM | POA: Diagnosis not present

## 2021-11-16 DIAGNOSIS — K219 Gastro-esophageal reflux disease without esophagitis: Secondary | ICD-10-CM | POA: Diagnosis not present

## 2021-11-16 DIAGNOSIS — Z1211 Encounter for screening for malignant neoplasm of colon: Secondary | ICD-10-CM | POA: Diagnosis not present

## 2021-11-16 DIAGNOSIS — E119 Type 2 diabetes mellitus without complications: Secondary | ICD-10-CM | POA: Insufficient documentation

## 2021-11-16 MED ORDER — ALBUTEROL SULFATE HFA 108 (90 BASE) MCG/ACT IN AERS: 2.0000 | INHALATION_SPRAY | Freq: Four times a day (QID) | RESPIRATORY_TRACT | 1 refills | Status: DC | PRN

## 2021-11-16 MED ORDER — BUDESONIDE-FORMOTEROL FUMARATE 80-4.5 MCG/ACT IN AERO: 2.0000 | INHALATION_SPRAY | Freq: Two times a day (BID) | RESPIRATORY_TRACT | 3 refills | Status: DC

## 2021-11-16 MED ORDER — HYDROCHLOROTHIAZIDE 12.5 MG PO CAPS: 12.5000 mg | ORAL_CAPSULE | Freq: Every day | ORAL | 0 refills | Status: DC

## 2021-11-16 MED ORDER — PANTOPRAZOLE SODIUM 40 MG PO TBEC: 40.0000 mg | DELAYED_RELEASE_TABLET | Freq: Every day | ORAL | 1 refills | Status: DC

## 2021-11-16 NOTE — Assessment & Plan Note (Signed)
He was previously taking metformin 500 mg daily She has been out of medications for 2 years Check A1c, urine creatinine albumin labs Will plan to restart metformin once blood work returns Avoid sugar sweets soda We need to start patient on a statin, check lipid panel in 4 weeks

## 2021-11-16 NOTE — Assessment & Plan Note (Signed)
Chronic uncontrolled condition Start Symbicort 80-4 0.5-4.5 mcg/ACT inhaler 2 puffs twice daily Albuterol 108 mcg/ACT 2 puffs every 6 hours as needed Avoid allergens

## 2021-11-16 NOTE — Patient Instructions (Addendum)
Please get your TDAP vaccines today . Consider getting your shingles vaccine as well.   Please start taking Symbicort, 2 puffs twice daily for your asthma .   Nurse please order blood pressure monitor today, monitor your blood pressure daily at home, keep a log and bring to your next visit in 4 weeks.    It is important that you exercise regularly at least 30 minutes 5 times a week.  Think about what you will eat, plan ahead. Choose " clean, green, fresh or frozen" over canned, processed or packaged foods which are more sugary, salty and fatty. 70 to 75% of food eaten should be vegetables and fruit. Three meals at set times with snacks allowed between meals, but they must be fruit or vegetables. Aim to eat over a 12 hour period , example 7 am to 7 pm, and STOP after  your last meal of the day. Drink water,generally about 64 ounces per day, no other drink is as healthy. Fruit juice is best enjoyed in a healthy way, by EATING the fruit.  Thanks for choosing [facility], we consider it a privelige to serve you.

## 2021-11-16 NOTE — Assessment & Plan Note (Signed)
Chronic condition Currently on Tums Restart Protonix 40 mg daily Avoid fried fatty foods, spicy foods

## 2021-11-16 NOTE — Assessment & Plan Note (Signed)
Wt Readings from Last 3 Encounters:  11/16/21 (!) 360 lb 1.9 oz (163.3 kg)  11/06/10 (!) 320 lb (145.2 kg)  Patient counseled on low-carb diet encouraged to engage in regular moderate exercises at least 150 minutes weekly.

## 2021-11-16 NOTE — Assessment & Plan Note (Signed)
BP Readings from Last 3 Encounters:  11/16/21 (!) 154/96  09/27/16 (!) 151/74  12/11/15 (!) 143/89  Chronic condition currently out of her medications since the past 2 years reStart hydrochlorothiazide 12.5 mg daily DASH diet advised engage in regular vigorous exercises at least 150 minutes weekly Monitor blood pressure daily at home keep a log and bring to next visit in 4 weeks BP goal is less than 130/80 due to her diabetes CMP today

## 2021-11-16 NOTE — Progress Notes (Addendum)
New Patient Office Visit  Subjective    Patient ID: Norma Horton, female    DOB: 08/17/1970  Age: 51 y.o. MRN: [id number]  CC:  Chief Complaint  Patient presents with   New Patient (Initial Visit)    Pt establishing care, c/o knee pain from work, would like general labs. Ran out of fluid pill. Needs refills. Will come back for a pap.     HPI Norma Horton with past medical history of hypertension, obesity, type 2 diabetes, hypokalemia, thyroid nodule presents to establish care for her chronic medical conditions. She has not been on medications for two years.  Previous PCP was at the [facility], last visit to previous PCP was 2 years ago   Asthma. Has been using her daughter albuterol inhaler 3-4 times every day . Does not smoke, has wheezing cough.  She denies shortness of breath, fever   Thyroid Nodule Found 6years ago. She was told that she would need surgery but she could not afford the surgery due to lack of insurance, sometimes has pain in her neck, denies trouble swallowing, constipation.  Has fatigue, obesity.   GERD. Takes tums as needed since she has been out of her Protonix she does eat a lot of spicy foods.  Patient stated that Tums does not help her acid reflux symptoms.  Sometimes has nausea, heartburn.  She denies constipation, bloody stool  Due for Tdap vaccine, shingles vaccine need for both vaccines discussed with patient today.  Tdap vaccine given.  She refused shingles vaccine today  Due for screening mammogram, colonoscopy, referral sent today  Patient referred to OB/GYN for Pap smear has had 2 sisters with cervical cancer.    Outpatient Encounter Medications as of 11/16/2021  Medication Sig   budesonide-formoterol (SYMBICORT) 80-4.5 MCG/ACT inhaler Inhale 2 puffs into the lungs 2 (two) times daily.   albuterol (VENTOLIN HFA) 108 (90 Base) MCG/ACT inhaler Inhale 2 puffs into the lungs every 6 (six) hours as needed for wheezing or  shortness of breath.   clonazePAM (KLONOPIN) 0.5 MG tablet Take 0.5 mg by mouth 3 (three) times daily as needed.   (Patient not taking: Reported on 11/16/2021)   diphenhydrAMINE (BENADRYL) 25 MG tablet Take 1 tablet (25 mg total) by mouth every 4 (four) hours as needed. As needed for itching (Patient not taking: Reported on 11/16/2021)   hydrochlorothiazide (MICROZIDE) 12.5 MG capsule Take 1 capsule (12.5 mg total) by mouth daily.   ibuprofen (ADVIL,MOTRIN) 800 MG tablet Take 800 mg by mouth every 8 (eight) hours as needed.   (Patient not taking: Reported on 11/16/2021)   metFORMIN (GLUCOPHAGE) 500 MG tablet Take by mouth. (Patient not taking: Reported on 11/16/2021)   pantoprazole (PROTONIX) 40 MG tablet Take 1 tablet (40 mg total) by mouth daily.   valACYclovir (VALTREX) 500 MG tablet Take 500 mg by mouth 1 day or 1 dose.   (Patient not taking: Reported on 11/16/2021)   [DISCONTINUED] albuterol (VENTOLIN HFA) 108 (90 Base) MCG/ACT inhaler Inhale into the lungs. (Patient not taking: Reported on 11/16/2021)   [DISCONTINUED] hydrochlorothiazide (MICROZIDE) 12.5 MG capsule Take by mouth. (Patient not taking: Reported on 11/16/2021)   [DISCONTINUED] pantoprazole (PROTONIX) 40 MG tablet Take 40 mg by mouth daily.   (Patient not taking: Reported on 11/16/2021)   [DISCONTINUED] potassium chloride SA (KLOR-CON M) 20 MEQ tablet Take by mouth. (Patient not taking: Reported on 11/16/2021)   [DISCONTINUED] predniSONE (DELTASONE) 10 MG tablet Take 6 tablets day one, 5 tablets  day two, 4 tablets day three, 3 tablets day four, 2 tablets day five, then 1 tablet day six (Patient not taking: Reported on 11/16/2021)   No facility-administered encounter medications on file as of 11/16/2021.    Past Medical History:  Diagnosis Date   Anxiety    Depression    Pt has had suicide ideations in the past.   Herpes simplex     Past Surgical History:  Procedure Laterality Date   CHOLECYSTECTOMY     TUBAL LIGATION       Family History  Problem Relation Age of Onset   COPD Mother    Heart attack Father    Hypertension Father    Cerebral aneurysm Sister    Cervical cancer Sister    Cervical cancer Sister    Heart attack Sister    Diabetes Mellitus II Brother    Throat cancer Brother    Diabetes Daughter    Asthma Daughter    Cystic fibrosis Daughter    Breast cancer Maternal Grandmother    Hypertension Other     Social History   Socioeconomic History   Marital status: Married    Spouse name: Not on file   Number of children: 4   Years of education: Not on file   Highest education level: Not on file  Occupational History   Not on file  Tobacco Use   Smoking status: Never   Smokeless tobacco: Never  Substance and Sexual Activity   Alcohol use: No   Drug use: No   Sexual activity: Yes    Birth control/protection: None  Other Topics Concern   Not on file  Social History Narrative   Lives with her husband and her children    Social Determinants of Health   Financial Resource Strain: Not on file  Food Insecurity: Not on file  Transportation Needs: Not on file  Physical Activity: Not on file  Stress: Not on file  Social Connections: Not on file  Intimate Partner Violence: Not on file    Review of Systems  Constitutional:  Positive for malaise/fatigue. Negative for chills, diaphoresis, fever and weight loss.  Cardiovascular:  Negative for chest pain, palpitations, orthopnea, claudication and leg swelling.  Gastrointestinal:  Positive for heartburn and nausea. Negative for blood in stool, constipation, diarrhea and vomiting.  Neurological:  Negative for dizziness, tingling, tremors, sensory change and headaches.  Psychiatric/Behavioral:  Negative for depression, hallucinations, substance abuse and suicidal ideas.         Objective    BP (!) 154/96   Pulse 69   Ht 5' 3"  (1.6 m)   Wt (!) 360 lb 1.9 oz (163.3 kg)   SpO2 95%   BMI 63.79 kg/m   Physical Exam Vitals and  nursing note reviewed.  Constitutional:      General: She is not in acute distress.    Appearance: She is obese. She is not ill-appearing, toxic-appearing or diaphoretic.  HENT:     Head: Normocephalic and atraumatic.     Right Ear: External ear normal.     Left Ear: External ear normal.     Mouth/Throat:     Mouth: Mucous membranes are moist.     Pharynx: Oropharynx is clear.  Cardiovascular:     Rate and Rhythm: Normal rate and regular rhythm.     Pulses: Normal pulses.     Heart sounds: Normal heart sounds. No murmur heard.    No friction rub. No gallop.  Pulmonary:  Effort: Pulmonary effort is normal. No respiratory distress.     Breath sounds: Normal breath sounds. No stridor. No wheezing, rhonchi or rales.  Chest:     Chest wall: No tenderness.  Musculoskeletal:     Cervical back: No rigidity or tenderness.  Skin:    Capillary Refill: Capillary refill takes less than 2 seconds.  Neurological:     Mental Status: She is alert and oriented to person, place, and time.     Motor: No weakness.     Coordination: Coordination normal.     Gait: Gait normal.     Deep Tendon Reflexes: Reflexes normal.  Psychiatric:        Mood and Affect: Mood normal.        Behavior: Behavior normal.        Thought Content: Thought content normal.        Judgment: Judgment normal.         Assessment & Plan:   Problem List Items Addressed This Visit       Cardiovascular and Mediastinum   Primary hypertension    BP Readings from Last 3 Encounters:  11/16/21 (!) 154/96  09/27/16 (!) 151/74  12/11/15 (!) 143/89  Chronic condition currently out of her medications since the past 2 years reStart hydrochlorothiazide 12.5 mg daily DASH diet advised engage in regular vigorous exercises at least 150 minutes weekly Monitor blood pressure daily at home keep a log and bring to next visit in 4 weeks BP goal is less than 130/80 due to her diabetes CMP today      Relevant Medications    hydrochlorothiazide (MICROZIDE) 12.5 MG capsule   Other Relevant Orders   CMP14+EGFR     Respiratory   Moderate persistent asthma without complication    Chronic uncontrolled condition Start Symbicort 80-4 0.5-4.5 mcg/ACT inhaler 2 puffs twice daily Albuterol 108 mcg/ACT 2 puffs every 6 hours as needed Avoid allergens      Relevant Medications   budesonide-formoterol (SYMBICORT) 80-4.5 MCG/ACT inhaler   albuterol (VENTOLIN HFA) 108 (90 Base) MCG/ACT inhaler     Digestive   Gastroesophageal reflux disease    Chronic condition Currently on Tums Restart Protonix 40 mg daily Avoid fried fatty foods, spicy foods      Relevant Medications   pantoprazole (PROTONIX) 40 MG tablet     Endocrine   Thyroid nodule greater than or equal to 1 cm in diameter incidentally noted on imaging study    Found on imaging done in 2017  FINDINGS: Right thyroid lobe   Measurements: 6.8 x 3.8 x 4.5 cm. Right upper pole predominately solid nodule measures 4.9 x 3.7 x 4.5 cm   Left thyroid lobe   Measurements: 4.9 x 1.5 x 1.4 cm. Heterogeneous tissue without focal Nodule.  She was told she needed surgery to remove the tumor but she could not do due to lack of insurance. Patient referred to endocrinologist today Check TSH, T4      Relevant Orders   Ambulatory referral to Endocrinology   TSH + free T4   Diabetes mellitus ([place])    He was previously taking metformin 500 mg daily She has been out of medications for 2 years Check A1c, urine creatinine albumin labs Will plan to restart metformin once blood work returns Avoid sugar sweets soda We need to start patient on a statin, check lipid panel in 4 weeks      Relevant Medications   metFORMIN (GLUCOPHAGE) 500 MG tablet   Other Relevant Orders  Urine microalbumin-creatinine with uACR   HgB A1c   Other Visit Diagnoses     Colon cancer screening    -  Primary   Relevant Orders   Cologuard   Breast cancer screening by mammogram        Relevant Orders   MM 3D SCREEN BREAST BILATERAL   Immunization due       Relevant Orders   Tdap vaccine greater than or equal to 7yo IM   Varicella-zoster vaccine IM   Screening for cervical cancer       Relevant Orders   Ambulatory referral to Obstetrics / Gynecology       Return in about 4 weeks (around 12/14/2021) for CPE.   Renee Rival, FNP

## 2021-11-16 NOTE — Assessment & Plan Note (Signed)
Found on imaging done in 2017  FINDINGS: Right thyroid lobe  Measurements: 6.8 x 3.8 x 4.5 cm. Right upper pole predominately solid nodule measures 4.9 x 3.7 x 4.5 cm  Left thyroid lobe  Measurements: 4.9 x 1.5 x 1.4 cm. Heterogeneous tissue without focal Nodule.  She was told she needed surgery to remove the tumor but she could not do due to lack of insurance. Patient referred to endocrinologist today Check TSH, T4

## 2021-11-17 ENCOUNTER — Other Ambulatory Visit: Payer: Self-pay | Admitting: Nurse Practitioner

## 2021-11-17 ENCOUNTER — Telehealth: Payer: Self-pay | Admitting: "Endocrinology

## 2021-11-17 DIAGNOSIS — I10 Essential (primary) hypertension: Secondary | ICD-10-CM

## 2021-11-17 MED ORDER — METFORMIN HCL 500 MG PO TABS: 500.0000 mg | ORAL_TABLET | Freq: Two times a day (BID) | ORAL | 3 refills | Status: DC

## 2021-11-17 NOTE — Telephone Encounter (Signed)
Dr Dorris Fetch  Will you see this patient will results from her thyroid in 2017?

## 2021-11-17 NOTE — Progress Notes (Signed)
Liver enzymes is slightly elevated patient should avoid alcohol Tylenol A1c of 6.9 patient should start taking metformin 500 mg twice daily avoid sugar sweets soda.    BMP in 2 weeks  Follow-up as planned.

## 2021-11-18 ENCOUNTER — Other Ambulatory Visit: Payer: Self-pay | Admitting: Nurse Practitioner

## 2021-11-18 LAB — CMP14+EGFR
ALT: 37 IU/L — ABNORMAL HIGH (ref 0–32)
AST: 30 IU/L (ref 0–40)
Albumin/Globulin Ratio: 1.3 (ref 1.2–2.2)
Albumin: 4.4 g/dL (ref 3.8–4.9)
Alkaline Phosphatase: 99 IU/L (ref 44–121)
BUN/Creatinine Ratio: 19 (ref 9–23)
BUN: 11 mg/dL (ref 6–24)
Bilirubin Total: 0.4 mg/dL (ref 0.0–1.2)
CO2: 19 mmol/L — ABNORMAL LOW (ref 20–29)
Calcium: 9.8 mg/dL (ref 8.7–10.2)
Chloride: 104 mmol/L (ref 96–106)
Creatinine, Ser: 0.59 mg/dL (ref 0.57–1.00)
Globulin, Total: 3.5 g/dL (ref 1.5–4.5)
Glucose: 135 mg/dL — ABNORMAL HIGH (ref 70–99)
Potassium: 5 mmol/L (ref 3.5–5.2)
Sodium: 141 mmol/L (ref 134–144)
Total Protein: 7.9 g/dL (ref 6.0–8.5)
eGFR: 108 mL/min/{1.73_m2} (ref >59)

## 2021-11-18 LAB — MICROALBUMIN / CREATININE URINE RATIO
Creatinine, Urine: 94.4 mg/dL
Microalb/Creat Ratio: 8 mg/g creat (ref 0–29)
Microalbumin, Urine: 7.6 ug/mL

## 2021-11-18 LAB — TSH+FREE T4
Free T4: 1.14 ng/dL (ref 0.82–1.77)
TSH: 2.66 u[IU]/mL (ref 0.450–4.500)

## 2021-11-18 LAB — HEMOGLOBIN A1C
Est. average glucose Bld gHb Est-mCnc: 151 mg/dL
Hgb A1c MFr Bld: 6.9 % — ABNORMAL HIGH (ref 4.8–5.6)

## 2021-11-18 NOTE — Progress Notes (Signed)
BMP in 2 weeks needed, thanks

## 2021-11-24 ENCOUNTER — Encounter (HOSPITAL_COMMUNITY): Payer: Self-pay | Admitting: Radiology

## 2021-11-24 ENCOUNTER — Ambulatory Visit (HOSPITAL_COMMUNITY)
Admission: RE | Admit: 2021-11-24 | Discharge: 2021-11-24 | Disposition: A | Discharge status: Home / Self Care | Payer: 59 | Source: Ambulatory Visit | Attending: Nurse Practitioner | Admitting: Nurse Practitioner

## 2021-11-24 DIAGNOSIS — Z1231 Encounter for screening mammogram for malignant neoplasm of breast: Secondary | ICD-10-CM | POA: Insufficient documentation

## 2021-11-30 ENCOUNTER — Encounter: Payer: Self-pay | Admitting: Nurse Practitioner

## 2021-12-11 DIAGNOSIS — M79621 Pain in right upper arm: Secondary | ICD-10-CM | POA: Diagnosis not present

## 2021-12-11 DIAGNOSIS — M25551 Pain in right hip: Secondary | ICD-10-CM | POA: Diagnosis not present

## 2021-12-11 DIAGNOSIS — S4991XA Unspecified injury of right shoulder and upper arm, initial encounter: Secondary | ICD-10-CM | POA: Diagnosis not present

## 2021-12-11 DIAGNOSIS — M542 Cervicalgia: Secondary | ICD-10-CM | POA: Diagnosis not present

## 2021-12-11 DIAGNOSIS — S79911A Unspecified injury of right hip, initial encounter: Secondary | ICD-10-CM | POA: Diagnosis not present

## 2021-12-11 DIAGNOSIS — G44319 Acute post-traumatic headache, not intractable: Secondary | ICD-10-CM | POA: Diagnosis not present

## 2021-12-11 DIAGNOSIS — S199XXA Unspecified injury of neck, initial encounter: Secondary | ICD-10-CM | POA: Diagnosis not present

## 2021-12-14 ENCOUNTER — Encounter: Payer: 59 | Admitting: Nurse Practitioner

## 2021-12-15 DIAGNOSIS — I10 Essential (primary) hypertension: Secondary | ICD-10-CM | POA: Diagnosis not present

## 2021-12-15 DIAGNOSIS — M545 Low back pain, unspecified: Secondary | ICD-10-CM | POA: Diagnosis not present

## 2021-12-15 DIAGNOSIS — M461 Sacroiliitis, not elsewhere classified: Secondary | ICD-10-CM | POA: Diagnosis not present

## 2021-12-15 DIAGNOSIS — B029 Zoster without complications: Secondary | ICD-10-CM | POA: Diagnosis not present

## 2021-12-15 DIAGNOSIS — M549 Dorsalgia, unspecified: Secondary | ICD-10-CM | POA: Diagnosis not present

## 2021-12-15 DIAGNOSIS — Z7984 Long term (current) use of oral hypoglycemic drugs: Secondary | ICD-10-CM | POA: Diagnosis not present

## 2021-12-27 ENCOUNTER — Other Ambulatory Visit: Payer: Self-pay | Admitting: Nurse Practitioner

## 2021-12-29 ENCOUNTER — Ambulatory Visit: Payer: 59 | Admitting: Adult Health

## 2022-01-03 ENCOUNTER — Other Ambulatory Visit: Payer: Self-pay | Admitting: Nurse Practitioner

## 2022-01-03 DIAGNOSIS — K219 Gastro-esophageal reflux disease without esophagitis: Secondary | ICD-10-CM

## 2022-01-05 ENCOUNTER — Ambulatory Visit: Payer: 59 | Admitting: "Endocrinology

## 2022-01-05 ENCOUNTER — Encounter: Payer: Self-pay | Admitting: "Endocrinology

## 2022-01-05 VITALS — BP 132/88 | HR 76 | Ht 63.0 in | Wt 361.6 lb

## 2022-01-05 DIAGNOSIS — E119 Type 2 diabetes mellitus without complications: Secondary | ICD-10-CM | POA: Diagnosis not present

## 2022-01-05 DIAGNOSIS — E049 Nontoxic goiter, unspecified: Secondary | ICD-10-CM | POA: Diagnosis not present

## 2022-01-05 DIAGNOSIS — I10 Essential (primary) hypertension: Secondary | ICD-10-CM

## 2022-01-05 NOTE — Progress Notes (Signed)
Endocrinology Consult Note                                            01/05/2022, 12:11 PM   Subjective:    Patient ID: Norma Horton, female    DOB: 1970-10-07, PCP Renee Rival, FNP   Past Medical History:  Diagnosis Date   Anxiety    Depression    Pt has had suicide ideations in the past.   Diabetes mellitus, type II ([place])    Herpes simplex    Hypertension    Past Surgical History:  Procedure Laterality Date   CHOLECYSTECTOMY     TUBAL LIGATION     Social History   Socioeconomic History   Marital status: Married    Spouse name: Not on file   Number of children: 4   Years of education: Not on file   Highest education level: Not on file  Occupational History   Not on file  Tobacco Use   Smoking status: Never   Smokeless tobacco: Never  Vaping Use   Vaping Use: Never used  Substance and Sexual Activity   Alcohol use: No   Drug use: No   Sexual activity: Yes    Birth control/protection: None  Other Topics Concern   Not on file  Social History Narrative   Lives with her husband and her children    Social Determinants of Health   Financial Resource Strain: Not on file  Food Insecurity: Not on file  Transportation Needs: Not on file  Physical Activity: Not on file  Stress: Not on file  Social Connections: Not on file   Family History  Problem Relation Age of Onset   COPD Mother    Heart attack Father    Hypertension Father    Cerebral aneurysm Sister    Cervical cancer Sister    Cervical cancer Sister    Heart attack Sister    Diabetes Mellitus II Brother    Throat cancer Brother    Diabetes Daughter    Asthma Daughter    Cystic fibrosis Daughter    Breast cancer Maternal Grandmother    Hypertension Other    Outpatient Encounter Medications as of 01/05/2022  Medication Sig   clonazePAM (KLONOPIN) 0.5 MG tablet Take 0.5 mg by mouth 3 (three) times daily as needed.   diphenhydrAMINE (BENADRYL) 25 MG tablet Take 1 tablet (25 mg  total) by mouth every 4 (four) hours as needed. As needed for itching   ibuprofen (ADVIL,MOTRIN) 800 MG tablet Take 800 mg by mouth every 8 (eight) hours as needed.   albuterol (VENTOLIN HFA) 108 (90 Base) MCG/ACT inhaler INHALE 2 PUFFS BY MOUTH EVERY 6 HOURS AS NEEDED FOR WHEEZING FOR SHORTNESS OF BREATH   budesonide-formoterol (SYMBICORT) 80-4.5 MCG/ACT inhaler Inhale 2 puffs into the lungs 2 (two) times daily.   hydrochlorothiazide (MICROZIDE) 12.5 MG capsule Take 1 capsule (12.5 mg total) by mouth daily.   metFORMIN (GLUCOPHAGE) 500 MG tablet Take 1 tablet (500 mg total) by mouth 2 (two) times daily with a meal.   pantoprazole (PROTONIX) 40 MG tablet Take 1 tablet by mouth once daily   valACYclovir (VALTREX) 500 MG tablet Take 500 mg by mouth 1 day or 1 dose.   (Patient not taking: Reported on 11/16/2021)   No facility-administered encounter medications on file as of 01/05/2022.  ALLERGIES: Allergies  Allergen Reactions   Fenoprofen Calcium     VACCINATION STATUS: Immunization History  Administered Date(s) Administered   Tdap 05/23/1998, 11/16/2021    HPI Norma Horton is 51 y.o. female who presents today with a medical history as above. she is being seen in consultation for multinodular goiter requested by Renee Rival, FNP.  History was obtained from the patient as well as chart review.  She has history of multinodular goiter dating back to 2017.  She was found to have a 4.9 cm nodule in the right lobe which was asymmetrically larger than the left lobe with no nodules.  Right lobe measures 6.8 cm, left lobe measured 4.9 cm. Patient describes dysphagia for pills.  She denies shortness of breath nor voice change.  Reportedly, she did not want to have biopsy hence did not follow-up on her nodular goiter. She reports progressive weight gain over the years.  She denies recent weight change, palpitations, heat/cold intolerance.  She has no family history of thyroid dysfunction or  thyroid malignancy. Her other medical problems include type 2 diabetes on metformin 500 mg p.o. twice daily with recent A1c of 6.9%, obesity with BMI of 64.05 . She has hypertension on treatment with hydrochlorothiazide. TSH and free T4 were within normal range in July 2023.  Review of Systems  Constitutional: + No recent weight change, no fatigue, no subjective hyperthermia, no subjective hypothermia Eyes: no blurry vision, no xerophthalmia ENT: no sore throat, no nodules palpated in throat, no dysphagia/odynophagia, no hoarseness Cardiovascular: no Chest Pain, no Shortness of Breath, no palpitations, no leg swelling Respiratory: no cough, no shortness of breath Gastrointestinal: no Nausea/Vomiting/Diarhhea Musculoskeletal: no muscle/joint aches Skin: no rashes Neurological: no tremors, no numbness, no tingling, no dizziness Psychiatric: no depression, no anxiety  Objective:       01/05/2022    9:59 AM 11/16/2021    1:56 PM 11/16/2021    1:05 PM  Vitals with BMI  Height '5\' 3"'$   '5\' 3"'$   Weight 361 lbs 10 oz  360 lbs 2 oz  BMI 02.63  78.58  Systolic 850 277 412  Diastolic 88 96 83  Pulse 76  69    BP 132/88   Pulse 76   Ht '5\' 3"'$  (1.6 m)   Wt (!) 361 lb 9.6 oz (164 kg)   LMP 08/30/2016   BMI 64.05 kg/m   Wt Readings from Last 3 Encounters:  01/05/22 (!) 361 lb 9.6 oz (164 kg)  11/16/21 (!) 360 lb 1.9 oz (163.3 kg)  11/06/10 (!) 320 lb (145.2 kg)    Physical Exam  Constitutional:  Body mass index is 64.05 kg/m.,  not in acute distress, normal state of mind Eyes: PERRLA, EOMI, no exophthalmos ENT: moist mucous membranes, + gross thyromegaly, no gross cervical lymphadenopathy Cardiovascular: normal precordial activity, Regular Rate and Rhythm, no Murmur/Rubs/Gallops Respiratory:  adequate breathing efforts, no gross chest deformity, Clear to auscultation bilaterally Gastrointestinal: abdomen soft, Non -tender, No distension, Bowel Sounds present, no gross  organomegaly Musculoskeletal: no gross deformities, strength intact in all four extremities Skin: moist, warm, no rashes Neurological: no tremor with outstretched hands, Deep tendon reflexes normal in bilateral lower extremities.  CMP ( most recent) CMP     Component Value Date/Time   NA 141 11/16/2021 1514   K 5.0 11/16/2021 1514   CL 104 11/16/2021 1514   CO2 19 (L) 11/16/2021 1514   GLUCOSE 135 (H) 11/16/2021 1514   GLUCOSE 135 (H) 11/06/2010 2348  BUN 11 11/16/2021 1514   CREATININE 0.59 11/16/2021 1514   CALCIUM 9.8 11/16/2021 1514   PROT 7.9 11/16/2021 1514   ALBUMIN 4.4 11/16/2021 1514   AST 30 11/16/2021 1514   ALT 37 (H) 11/16/2021 1514   ALKPHOS 99 11/16/2021 1514   BILITOT 0.4 11/16/2021 1514   GFRNONAA >60 11/06/2010 2348   GFRAA >60 11/06/2010 2348    Thyroid ultrasound on November 19, 2015 FINDINGS: Right thyroid lobe   Measurements: 6.8 x 3.8 x 4.5 cm. Right upper pole predominately solid nodule measures 4.9 x 3.7 x 4.5 cm   Left thyroid lobe : Measurements: 4.9 x 1.5 x 1.4 cm. Heterogeneous tissue without focal  nodule.   Isthmus : Thickness: 0.3 cm.  No nodules visualized.   Lymphadenopathy: None visualized.   IMPRESSION: Solitary large right lobe nodule measures 4.9 cm. Findings meet consensus criteria for biopsy. Ultrasound-guided fine needle aspiration should be considered, as per the consensus statement: Management of Thyroid Nodules Detected at [place]: Society of Radiologists in [place]. Radiology 2005; [id number].  Diabetic Labs (most recent): Lab Results  Component Value Date   HGBA1C 6.9 (H) 11/16/2021     Lab Results  Component Value Date   TSH 2.660 11/16/2021   FREET4 1.14 11/16/2021      Assessment & Plan:   1. Goiter  - Mims  is being seen at a kind request of [place], Dewaine Conger, FNP. - I have reviewed her available  records and clinically evaluated the patient. - Based on these  reviews, she has history of nodular goiter,  however,  there is not sufficient information to proceed with definitive treatment plan. We will have repeat thyroid function test and thyroid ultrasound. She during her metabolic dysfunction, she is offered fasting lipid panel, 25-hydroxy vitamin D measurement along with her thyroid function tests.  -If she returns with similar findings of large nodule in her thyroid, she will be considered for fine-needle aspiration biopsy - I did not initiate any new prescriptions today.  She will be considered for lifestyle medicine for thyroid conditions percent.  She is advised to continue her current medications including metformin 500 mg p.o. twice daily, hydrochlorothiazide 12.5 mg p.o. daily. - she is advised to maintain close follow up with Renee Rival, FNP for primary care needs.   - Time spent with the patient: 50 minutes, of which >50% was spent in  counseling her about her nodular goiter, type 2 diabetes, weight management, and the rest in obtaining information about her symptoms, reviewing her previous labs/studies ( including abstractions from other facilities),  evaluations, and treatments,  and developing a plan to confirm diagnosis and long term treatment based on the latest standards of care/guidelines; and documenting her care.  Norma Horton participated in the discussions, expressed understanding, and voiced agreement with the above plans.  All questions were answered to her satisfaction. she is encouraged to contact clinic should she have any questions or concerns prior to her return visit.  Follow up plan: Return in about 2 weeks (around 01/19/2022) for Labs Today- Non-Fasting Ok, Thyroid / Neck Ultrasound.   Glade Lloyd, MD [facility] [address] [place], [place] [address] Phone: [phone]  Fax: [phone]     01/05/2022, 12:11 PM  This note was partially dictated with  voice recognition software. Similar sounding words can be transcribed inadequately or may not  be corrected upon review.

## 2022-01-12 ENCOUNTER — Ambulatory Visit (HOSPITAL_COMMUNITY): Payer: 59

## 2022-01-13 ENCOUNTER — Encounter: Payer: 59 | Admitting: Nurse Practitioner

## 2022-01-21 ENCOUNTER — Ambulatory Visit (INDEPENDENT_AMBULATORY_CARE_PROVIDER_SITE_OTHER): Payer: 59 | Admitting: Family Medicine

## 2022-01-21 ENCOUNTER — Encounter: Payer: Self-pay | Admitting: Family Medicine

## 2022-01-21 ENCOUNTER — Ambulatory Visit (HOSPITAL_COMMUNITY)
Admission: RE | Admit: 2022-01-21 | Discharge: 2022-01-21 | Disposition: A | Discharge status: Home / Self Care | Payer: 59 | Source: Ambulatory Visit | Attending: "Endocrinology | Admitting: "Endocrinology

## 2022-01-21 VITALS — BP 157/86 | HR 70 | Ht 63.0 in | Wt 361.0 lb

## 2022-01-21 DIAGNOSIS — R69 Illness, unspecified: Secondary | ICD-10-CM | POA: Diagnosis not present

## 2022-01-21 DIAGNOSIS — G479 Sleep disorder, unspecified: Secondary | ICD-10-CM

## 2022-01-21 DIAGNOSIS — E119 Type 2 diabetes mellitus without complications: Secondary | ICD-10-CM

## 2022-01-21 DIAGNOSIS — E049 Nontoxic goiter, unspecified: Secondary | ICD-10-CM | POA: Diagnosis not present

## 2022-01-21 DIAGNOSIS — K219 Gastro-esophageal reflux disease without esophagitis: Secondary | ICD-10-CM | POA: Diagnosis not present

## 2022-01-21 DIAGNOSIS — F320 Major depressive disorder, single episode, mild: Secondary | ICD-10-CM

## 2022-01-21 DIAGNOSIS — Z23 Encounter for immunization: Secondary | ICD-10-CM

## 2022-01-21 DIAGNOSIS — I10 Essential (primary) hypertension: Secondary | ICD-10-CM | POA: Diagnosis not present

## 2022-01-21 DIAGNOSIS — F5104 Psychophysiologic insomnia: Secondary | ICD-10-CM | POA: Insufficient documentation

## 2022-01-21 DIAGNOSIS — Z2821 Immunization not carried out because of patient refusal: Secondary | ICD-10-CM | POA: Diagnosis not present

## 2022-01-21 DIAGNOSIS — E041 Nontoxic single thyroid nodule: Secondary | ICD-10-CM | POA: Diagnosis not present

## 2022-01-21 DIAGNOSIS — Z0001 Encounter for general adult medical examination with abnormal findings: Secondary | ICD-10-CM | POA: Insufficient documentation

## 2022-01-21 DIAGNOSIS — M5416 Radiculopathy, lumbar region: Secondary | ICD-10-CM

## 2022-01-21 DIAGNOSIS — M792 Neuralgia and neuritis, unspecified: Secondary | ICD-10-CM | POA: Diagnosis not present

## 2022-01-21 MED ORDER — NAPROXEN 500 MG PO TABS: 500.0000 mg | ORAL_TABLET | Freq: Two times a day (BID) | ORAL | 1 refills | Status: DC

## 2022-01-21 MED ORDER — GABAPENTIN 100 MG PO CAPS: 100.0000 mg | ORAL_CAPSULE | Freq: Every day | ORAL | 1 refills | Status: DC

## 2022-01-21 MED ORDER — HYDROXYZINE PAMOATE 25 MG PO CAPS: 25.0000 mg | ORAL_CAPSULE | Freq: Three times a day (TID) | ORAL | 0 refills | Status: DC | PRN

## 2022-01-21 NOTE — Assessment & Plan Note (Addendum)
She reports not taking her BP medication today She takes hydrochlorothiazide 12.5 mg daily She has been fasting today She reports ambulatory readings of <140/90  She denies headaches, dizziness, chest pain and palpitations Will f/u on BP in 2 weeks BP Readings from Last 3 Encounters:  01/21/22 (!) 157/86  01/05/22 132/88  11/16/21 (!) 154/96

## 2022-01-21 NOTE — Assessment & Plan Note (Signed)
Physical exam as documented Counseling is done on healthy lifestyle involving commitment to 150 minutes of exercise per week,  Discussed heart-healthy diet and attaining a healthy weight Changes in health habits are decided on by the patient with goals and time frames set for achieving them. Immunization and cancer screening needs are specifically addressed at this visit  She received her dose of shingles vaccine and declined the flu vaccine

## 2022-01-21 NOTE — Assessment & Plan Note (Signed)
She reports sleep disturbance due to the pain in her right leg She notes that pain wakes her up at nighttime Will start a trial of Vistaril 25 mg QHS

## 2022-01-21 NOTE — Assessment & Plan Note (Addendum)
Moderate depression severity PHQ-9 is 11 She declines pharmacological management at this time She noted to be speaking with a therapist
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
## 2022-01-21 NOTE — Assessment & Plan Note (Addendum)
Stable with Protonix 40 mg PRN She denies dyspepsia and heartburn

## 2022-01-21 NOTE — Patient Instructions (Addendum)
I appreciate the opportunity to provide care to you today!    Follow up:  3 months  Conservative managements for Low Back Pain include Heat therapy --  helps reduce muscle spasm Cold Therapy- helps reduce edema Take GZF:[id number] antiinflammatory drugs: Naproxen 500 mg    Take Gabapentin 100 mg at bedtime   Please pick your medications at the pharmacy    Please continue to a heart-healthy diet and increase your physical activities. Try to exercise for 65mns at least three times a week.      It was a pleasure to see you and I look forward to continuing to work together on your health and well-being. Please do not hesitate to call the office if you need care or have questions about your care.   Have a wonderful day and week. With Gratitude, GAlvira MondayMSN, FNP-BC

## 2022-01-21 NOTE — Assessment & Plan Note (Addendum)
Wt Readings from Last 3 Encounters:  01/21/22 (!) 361 lb (163.7 kg)  01/05/22 (!) 361 lb 9.6 oz (164 kg)  11/16/21 (!) 360 lb 1.9 oz (163.3 kg)   She reports minimum physical activities Encouraged heart-healthy diet and increased physical activities
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
## 2022-01-22 LAB — T4, FREE: Free T4: 1.15 ng/dL (ref 0.82–1.77)

## 2022-01-22 LAB — LIPID PANEL
Chol/HDL Ratio: 2.9 ratio (ref 0.0–4.4)
Cholesterol, Total: 154 mg/dL (ref 100–199)
HDL: 54 mg/dL (ref >39)
LDL Chol Calc (NIH): 82 mg/dL (ref 0–99)
Triglycerides: 98 mg/dL (ref 0–149)
VLDL Cholesterol Cal: 18 mg/dL (ref 5–40)

## 2022-01-22 LAB — VITAMIN D 25 HYDROXY (VIT D DEFICIENCY, FRACTURES): Vit D, 25-Hydroxy: 15.1 ng/mL — ABNORMAL LOW (ref 30.0–100.0)

## 2022-01-22 LAB — T3, FREE: T3, Free: 3 pg/mL (ref 2.0–4.4)

## 2022-01-22 LAB — THYROID PEROXIDASE ANTIBODY: Thyroperoxidase Ab SerPl-aCnc: 16 IU/mL (ref 0–34)

## 2022-01-22 LAB — TSH: TSH: 3.9 u[IU]/mL (ref 0.450–4.500)

## 2022-01-22 LAB — THYROGLOBULIN ANTIBODY: Thyroglobulin Antibody: <1 IU/mL (ref 0.0–0.9)

## 2022-01-26 ENCOUNTER — Ambulatory Visit: Payer: 59 | Admitting: "Endocrinology

## 2022-02-03 ENCOUNTER — Encounter: Payer: Self-pay | Admitting: Adult Health

## 2022-02-03 ENCOUNTER — Other Ambulatory Visit (HOSPITAL_COMMUNITY)
Admission: RE | Admit: 2022-02-03 | Discharge: 2022-02-03 | Disposition: A | Discharge status: Home / Self Care | Payer: 59 | Source: Ambulatory Visit | Attending: Adult Health | Admitting: Adult Health

## 2022-02-03 ENCOUNTER — Ambulatory Visit (INDEPENDENT_AMBULATORY_CARE_PROVIDER_SITE_OTHER): Payer: 59 | Admitting: Adult Health

## 2022-02-03 VITALS — BP 157/87 | HR 61 | Ht 63.0 in | Wt 357.2 lb

## 2022-02-03 DIAGNOSIS — F32A Depression, unspecified: Secondary | ICD-10-CM

## 2022-02-03 DIAGNOSIS — Z1211 Encounter for screening for malignant neoplasm of colon: Secondary | ICD-10-CM | POA: Insufficient documentation

## 2022-02-03 DIAGNOSIS — F419 Anxiety disorder, unspecified: Secondary | ICD-10-CM | POA: Diagnosis not present

## 2022-02-03 DIAGNOSIS — Z01419 Encounter for gynecological examination (general) (routine) without abnormal findings: Secondary | ICD-10-CM | POA: Insufficient documentation

## 2022-02-03 DIAGNOSIS — F411 Generalized anxiety disorder: Secondary | ICD-10-CM | POA: Insufficient documentation

## 2022-02-03 DIAGNOSIS — R350 Frequency of micturition: Secondary | ICD-10-CM | POA: Insufficient documentation

## 2022-02-03 LAB — POCT URINALYSIS DIPSTICK
Blood, UA: NEGATIVE
Glucose, UA: NEGATIVE
Ketones, UA: NEGATIVE
Leukocytes, UA: NEGATIVE
Nitrite, UA: NEGATIVE
Protein, UA: NEGATIVE

## 2022-02-03 LAB — HEMOCCULT GUIAC POC 1CARD (OFFICE): Fecal Occult Blood, POC: NEGATIVE

## 2022-02-03 NOTE — Progress Notes (Signed)
Patient ID: Norma Horton, female   DOB: 05-Jun-1970, 51 y.o.   MRN: [id number] History of Present Illness: Norma Horton is a 51 year old white female, married,PM un for a well woman gyn exam and pap,last pap 2015. She has urinary frequency, take fluid pill she says. She works from home and also at [employer].   PCP is Alvira Monday, NP.  Current Medications, Allergies, Past Medical History, Past Surgical History, Family History and Social History were reviewed in [employer] record.     Review of Systems: Patient denies any headaches, hearing loss, fatigue, blurred vision, shortness of breath, chest pain, abdominal pain, problems with bowel movements,  or intercourse. No joint pain or mood swings. Denies any vaginal bleeding  +urinary frequency    Physical Exam:BP (!) 157/87 (BP Location: Left Arm, Patient Position: Sitting, Cuff Size: Normal)   Pulse 61   Ht '5\' 3"'$  (1.6 m)   Wt (!) 357 lb 3.2 oz (162 kg)   LMP 08/30/2016   BMI 63.28 kg/m  Urine dipstick was negative.  General:  Well developed, well nourished, no acute distress Skin:  Warm and dry Neck:  Midline trachea,enlarged thyroid, good ROM, no lymphadenopathy Lungs; Clear to auscultation bilaterally Breast:  No dominant palpable mass, retraction, or nipple discharge Cardiovascular: Regular rate and rhythm Abdomen:  Soft, non tender, no hepatosplenomegaly,obese Pelvic:  External genitalia is normal in appearance, no lesions.  The vagina is pale with loss of moisture. Urethra has no lesions or masses. The cervix is bulbous, pap with HR HPV genotyping performed  .  Uterus is felt to be normal size, shape, and contour, but difficult exam due to abdominal girth.   No adnexal masses or tenderness noted.Bladder is non tender, no masses felt. Rectal: Good sphincter tone, no polyps, or hemorrhoids felt.  Hemoccult negative. Extremities/musculoskeletal:  No swelling or varicosities noted, no clubbing or cyanosis Psych:  No  mood changes, alert and cooperative,seems happy AA is 1 Fall risk is low    02/03/2022   10:28 AM 01/21/2022    2:03 PM 01/21/2022    1:22 PM  Depression screen PHQ 2/9  Decreased Interest 1 1 0  Down, Depressed, Hopeless 1 1 0  PHQ - 2 Score 2 2 0  Altered sleeping 3 3   Tired, decreased energy 3 3   Change in appetite 0 1   Feeling bad or failure about yourself  0 1   Trouble concentrating 1 0   Moving slowly or fidgety/restless 3 0   Suicidal thoughts 0 1   PHQ-9 Score 12 11   Difficult doing work/chores  Somewhat difficult        02/03/2022   10:28 AM  GAD 7 : Generalized Anxiety Score  Nervous, Anxious, on Edge 1  Control/stop worrying 3  Worry too much - different things 3  Trouble relaxing 1  Restless 0  Easily annoyed or irritable 1  Afraid - awful might happen 0  Total GAD 7 Score 9      Upstream - 02/03/22 1002       Pregnancy Intention Screening   Does the patient want to become pregnant in the next year? No    Does the patient's partner want to become pregnant in the next year? No    Would the patient like to discuss contraceptive options today? No      Contraception Wrap Up   Current Method Female Sterilization    End Method Female Sterilization  Contraception Counseling Provided No            Examination chaperoned by Levy Pupa LPN   Impression and Plan: 1. Encounter for gynecological examination with Papanicolaou smear of cervix Pap sent Pap in 3 years if negative Physical in 1 year Labs with PCP Had negative mammogram 11/24/21 - Cytology - PAP( [facility]) Colonoscopy advised  2. Urinary frequency Urine was negative  - POCT Urinalysis Dipstick  3. Encounter for screening fecal occult blood testing Hemoccult was negative  - POCT occult blood stool  4. Anxiety and depression She declines meds

## 2022-02-04 ENCOUNTER — Ambulatory Visit: Payer: 59 | Admitting: Family Medicine

## 2022-02-05 ENCOUNTER — Other Ambulatory Visit: Payer: Self-pay | Admitting: Nurse Practitioner

## 2022-02-05 DIAGNOSIS — K219 Gastro-esophageal reflux disease without esophagitis: Secondary | ICD-10-CM

## 2022-02-07 ENCOUNTER — Other Ambulatory Visit: Payer: Self-pay | Admitting: Family Medicine

## 2022-02-07 ENCOUNTER — Other Ambulatory Visit: Payer: Self-pay | Admitting: Nurse Practitioner

## 2022-02-07 DIAGNOSIS — M5416 Radiculopathy, lumbar region: Secondary | ICD-10-CM

## 2022-02-07 DIAGNOSIS — K219 Gastro-esophageal reflux disease without esophagitis: Secondary | ICD-10-CM

## 2022-02-07 DIAGNOSIS — G479 Sleep disorder, unspecified: Secondary | ICD-10-CM

## 2022-02-07 DIAGNOSIS — J4540 Moderate persistent asthma, uncomplicated: Secondary | ICD-10-CM

## 2022-02-07 DIAGNOSIS — M792 Neuralgia and neuritis, unspecified: Secondary | ICD-10-CM

## 2022-02-07 LAB — CYTOLOGY - PAP
Adequacy: ABSENT
Comment: NEGATIVE
Diagnosis: NEGATIVE
High risk HPV: NEGATIVE

## 2022-02-08 ENCOUNTER — Encounter: Payer: Self-pay | Admitting: "Endocrinology

## 2022-02-08 ENCOUNTER — Ambulatory Visit: Payer: 59 | Admitting: "Endocrinology

## 2022-02-08 VITALS — BP 128/82 | HR 56 | Ht 63.0 in | Wt 359.6 lb

## 2022-02-08 DIAGNOSIS — E119 Type 2 diabetes mellitus without complications: Secondary | ICD-10-CM | POA: Diagnosis not present

## 2022-02-08 DIAGNOSIS — E049 Nontoxic goiter, unspecified: Secondary | ICD-10-CM

## 2022-02-08 DIAGNOSIS — I10 Essential (primary) hypertension: Secondary | ICD-10-CM

## 2022-02-08 DIAGNOSIS — E559 Vitamin D deficiency, unspecified: Secondary | ICD-10-CM

## 2022-02-08 MED ORDER — VITAMIN D3 125 MCG (5000 UT) PO CAPS: 5000.0000 [IU] | ORAL_CAPSULE | Freq: Every day | ORAL | 0 refills | Status: DC

## 2022-02-08 NOTE — Patient Instructions (Signed)

## 2022-02-08 NOTE — Progress Notes (Signed)
02/08/2022, 2:40 PM   Endocrinology follow-up note  Subjective:    Patient ID: Norma Horton, female    DOB: [birth year], PCP Alvira Monday, FNP   Past Medical History:  Diagnosis Date   Anxiety    Depression    Pt has had suicide ideations in the past.   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   Past Surgical History:  Procedure Laterality Date   CHOLECYSTECTOMY     TUBAL LIGATION     Social History   Socioeconomic History   Marital status: Married    Spouse name: Not on file   Number of children: 4   Years of education: Not on file   Highest education level: Not on file  Occupational History   Not on file  Tobacco Use   Smoking status: Never   Smokeless tobacco: Never  Vaping Use   Vaping Use: Never used  Substance and Sexual Activity   Alcohol use: Yes    Comment: occ   Drug use: No   Sexual activity: Yes    Birth control/protection: Surgical, Post-menopausal    Comment: tubal  Other Topics Concern   Not on file  Social History Narrative   Lives with her husband and her children    Social Determinants of Health   Financial Resource Strain: Medium Risk (02/03/2022)   Overall Financial Resource Strain (CARDIA)    Difficulty of Paying Living Expenses: Somewhat hard  Food Insecurity: No Food Insecurity (02/03/2022)   Hunger Vital Sign    Worried About Running Out of Food in the Last Year: Never true    Ran Out of Food in the Last Year: Never true  Transportation Needs: No Transportation Needs (02/03/2022)   PRAPARE - Hydrologist (Medical): No    Lack of Transportation (Non-Medical): No  Physical Activity: Inactive (02/03/2022)   Exercise Vital Sign    Days of Exercise per Week: 0 days    Minutes of Exercise per Session: 0 min  Stress: Stress Concern Present (02/03/2022)   [place]    Feeling of Stress : Very much  Social Connections: Moderately Isolated (02/03/2022)   Social Connection and Isolation Panel [NHANES]    Frequency of Communication with Friends and Family: More than three times a week    Frequency of Social Gatherings with Friends and Family: Twice a week    Attends Religious Services: Never    Marine scientist or Organizations: No    Attends Music therapist: Never    Marital Status: Married   Family History  Problem Relation Age of Onset   Breast cancer Maternal Grandmother    Heart attack Father    Hypertension Father    COPD Mother    Diabetes Mellitus II Brother    Throat cancer Brother    Cerebral aneurysm Sister    Cervical cancer Sister    Cervical cancer Sister    Heart attack Sister    Diabetes Daughter    Asthma Daughter    Cystic fibrosis Daughter  Hypertension Other    Outpatient Encounter Medications as of 02/08/2022  Medication Sig   Cholecalciferol (VITAMIN D3) 125 MCG (5000 UT) CAPS Take 1 capsule (5,000 Units total) by mouth daily.   albuterol (VENTOLIN HFA) 108 (90 Base) MCG/ACT inhaler INHALE 2 PUFFS BY MOUTH EVERY 6 HOURS AS NEEDED FOR WHEEZING AND FOR SHORTNESS OF BREATH   budesonide-formoterol (SYMBICORT) 80-4.5 MCG/ACT inhaler Inhale 2 puffs by mouth twice daily   clonazePAM (KLONOPIN) 0.5 MG tablet Take 0.5 mg by mouth 3 (three) times daily as needed.   gabapentin (NEURONTIN) 100 MG capsule Take 1 capsule by mouth at bedtime   hydrochlorothiazide (MICROZIDE) 12.5 MG capsule Take 1 capsule (12.5 mg total) by mouth daily.   hydrOXYzine (VISTARIL) 25 MG capsule TAKE 1 CAPSULE BY MOUTH EVERY 8 HOURS AS NEEDED   metFORMIN (GLUCOPHAGE) 500 MG tablet TAKE 1 TABLET BY MOUTH TWICE DAILY WITH A MEAL   naproxen (NAPROSYN) 500 MG tablet TAKE 1 TABLET BY MOUTH TWICE DAILY WITH A MEAL   pantoprazole (PROTONIX) 40 MG tablet Take 1 tablet by mouth once daily   valACYclovir  (VALTREX) 500 MG tablet Take 500 mg by mouth 1 day or 1 dose.   No facility-administered encounter medications on file as of 02/08/2022.   ALLERGIES: Allergies  Allergen Reactions   Fenoprofen Calcium     VACCINATION STATUS: Immunization History  Administered Date(s) Administered   Tdap 05/23/1998, 11/16/2021   Zoster Recombinat (Shingrix) 01/21/2022    HPI Norma Horton is 51 y.o. female who presents today with a medical history as above. she is being seen in follow-up after she was seen in consultation for multinodular goiter requested by Alvira Monday, [place].  See notes from previous visit.  She has history of multinodular goiter dating back to 2017.  She was found to have a 4.9 cm nodule in the right lobe which was asymmetrically larger than the left lobe with no nodules.  Right lobe measures 6.8 cm, left lobe measured 4.9 cm.  Prior biopsy in 2017 with benign findings. Patient describes dysphagia for pills, not with food and drinks.  She denies shortness of breath nor voice change.  More recently,  she did not want to have biopsy .  Repeat thyroid ultrasound was performed on January 21, 2022 showing   Isthmus: 0.3 cm ,previously 0.3 cm   Right lobe: 7.7 x 3.9 x 4.7 cm ,previously 6.8 x 3.8 x 4.5 cm,    Left lobe: 5.2 x 1.1 x 1.5 cm ,previously 4.9 x 1.5 x 1.4 cm  Nodule in the right lobe of her thyroid size: 6.4 cm; Other 2 dimensions: 4.8 x 3.8 cm, previously, 4.9 x 3.7 x 4.5 cm  She reports progressive weight gain over the years.  She denies recent weight change, palpitations, heat/cold intolerance.  She has no family history of thyroid dysfunction or thyroid malignancy. Her other medical problems include type 2 diabetes on metformin 500 mg p.o. twice daily with recent A1c of 6.9%, obesity with BMI of 64.05 . She has hypertension on treatment with hydrochlorothiazide. TSH and free T4 were within normal range in July 2023.  Review of Systems  Constitutional:  No  recent weight change, no fatigue, no subjective hyperthermia, no subjective hypothermia Eyes: no blurry vision, no xerophthalmia ENT: no sore throat, no nodules palpated in throat, no dysphagia/odynophagia, no hoarseness Cardiovascular: no Chest Pain, no Shortness of Breath, no palpitations, no leg swelling Respiratory: no cough, no shortness of breath Gastrointestinal: no Nausea/Vomiting/Diarhhea Musculoskeletal: no  muscle/joint aches Skin: no rashes Neurological: no tremors, no numbness, no tingling, no dizziness Psychiatric: no depression, no anxiety  Objective:       02/08/2022   11:23 AM 02/03/2022    9:58 AM 01/21/2022    1:21 PM  Vitals with BMI  Height '5\' 3"'$  '5\' 3"'$  '5\' 3"'$   Weight 359 lbs 10 oz 357 lbs 3 oz 361 lbs  BMI 63.72 73.22 02.54  Systolic 270 623 762  Diastolic 82 87 86  Pulse 56 61 70    BP 128/82   Pulse (!) 56   Ht '5\' 3"'$  (1.6 m)   Wt (!) 359 lb 9.6 oz (163.1 kg)   LMP 08/30/2016   BMI 63.70 kg/m   Wt Readings from Last 3 Encounters:  02/08/22 (!) 359 lb 9.6 oz (163.1 kg)  02/03/22 (!) 357 lb 3.2 oz (162 kg)  01/21/22 (!) 361 lb (163.7 kg)    Physical Exam  Constitutional:  Body mass index is 63.7 kg/m.,  not in acute distress, normal state of mind Eyes: PERRLA, EOMI, no exophthalmos ENT: moist mucous membranes, + gross thyromegaly, no gross cervical lymphadenopathy   CMP ( most recent) CMP     Component Value Date/Time   NA 141 11/16/2021 1514   K 5.0 11/16/2021 1514   CL 104 11/16/2021 1514   CO2 19 (L) 11/16/2021 1514   GLUCOSE 135 (H) 11/16/2021 1514   GLUCOSE 135 (H) 11/06/2010 2348   BUN 11 11/16/2021 1514   CREATININE 0.59 11/16/2021 1514   CALCIUM 9.8 11/16/2021 1514   PROT 7.9 11/16/2021 1514   ALBUMIN 4.4 11/16/2021 1514   AST 30 11/16/2021 1514   ALT 37 (H) 11/16/2021 1514   ALKPHOS 99 11/16/2021 1514   BILITOT 0.4 11/16/2021 1514   GFRNONAA >60 11/06/2010 2348   GFRAA >60 11/06/2010 2348    Thyroid ultrasound on November 19, 2015 FINDINGS: Right thyroid lobe   Measurements: 6.8 x 3.8 x 4.5 cm. Right upper pole predominately solid nodule measures 4.9 x 3.7 x 4.5 cm   Left thyroid lobe : Measurements: 4.9 x 1.5 x 1.4 cm. Heterogeneous tissue without focal  nodule.   Isthmus : Thickness: 0.3 cm.  No nodules visualized.   Lymphadenopathy: None visualized.   IMPRESSION: Solitary large right lobe nodule measures 4.9 cm. Findings meet consensus criteria for biopsy. Ultrasound-guided fine needle aspiration should be considered, as per the consensus statement: Management of Thyroid Nodules Detected at [place]: Society of Radiologists in [place]. Radiology 2005; [id number].   Repeat thyroid ultrasound was performed on January 21, 2022 showing   Isthmus: 0.3 cm ,previously 0.3 cm   Right lobe: 7.7 x 3.9 x 4.7 cm ,previously 6.8 x 3.8 x 4.5 cm,    Left lobe: 5.2 x 1.1 x 1.5 cm ,previously 4.9 x 1.5 x 1.4 cm  Nodule in the right lobe of her thyroid size: 6.4 cm; Other 2 dimensions: 4.8 x 3.8 cm, previously, 4.9 x 3.7 x 4.5 cm  Diabetic Labs (most recent): Lab Results  Component Value Date   HGBA1C 6.9 (H) 11/16/2021     Lab Results  Component Value Date   TSH 3.900 01/21/2022   TSH 2.660 11/16/2021   FREET4 1.15 01/21/2022   FREET4 1.14 11/16/2021      Assessment & Plan:   1.  Euthyroid nodular goiter 2.  Morbid obesity   3.  Hypertension   4.  Vitamin D deficiency   - I have reviewed her  existing and new thyroid work-up studies.   - Based on these reviews, she has history of nodular goiter, growing overall, and growing nodule on the right lobe.  This nodule was previously biopsied with bethesda 3 findings in 2017.  Patient would like to avoid biopsy at this time.  She does not have major compressive symptoms at this time.  She is advised to call clinic if she develops dysphagia, shortness of breath, nor voice change . she will have repeat ultrasound in a year.    In light of her euthyroid presentation, she will not need intervention with thyroid hormone or antithyroid medications.    In light of her metabolic dysfunction, indicated by type 2 diabetes, hypertension, would benefit from lifestyle medicine.  Whole food plant-based diet was suggested.    She is initiated on vitamin D 3 supplements.   She is advised to continue her current medications including metformin 500 mg p.o. twice daily, hydrochlorothiazide 12.5 mg p.o. daily.  - she is advised to maintain close follow up with Alvira Monday, FNP for primary care needs.   I spent 32 minutes in the care of the patient today including review of labs from Thyroid Function, CMP, and other relevant labs ; imaging/biopsy records (current and previous including abstractions from other facilities); face-to-face time discussing  her lab results and symptoms, medications doses, her options of short and long term treatment based on the latest standards of care / guidelines;   and documenting the encounter.  Sarit Garnetta Buddy  participated in the discussions, expressed understanding, and voiced agreement with the above plans.  All questions were answered to her satisfaction. she is encouraged to contact clinic should she have any questions or concerns prior to her return visit.   Follow up plan: Return in about 6 months (around 08/10/2022) for F/U with Pre-visit Labs, A1c -NV.   Glade Lloyd, MD [facility] [address] [place], [place] [address] Phone: [phone]  Fax: [phone]     02/08/2022, 2:40 PM  This note was partially dictated with voice recognition software. Similar sounding words can be transcribed inadequately or may not  be corrected upon review.

## 2022-02-09 ENCOUNTER — Encounter: Payer: Self-pay | Admitting: Family Medicine

## 2022-02-09 ENCOUNTER — Ambulatory Visit (INDEPENDENT_AMBULATORY_CARE_PROVIDER_SITE_OTHER): Payer: 59 | Admitting: Family Medicine

## 2022-02-09 DIAGNOSIS — I10 Essential (primary) hypertension: Secondary | ICD-10-CM

## 2022-02-09 NOTE — Patient Instructions (Signed)
I appreciate the opportunity to provide care to you today!    Follow up:  04/22/22  Please continue taking your medication as prescribed I will see you for your next office visit Keep up the good work!   Please continue to a heart-healthy diet and increase your physical activities. Try to exercise for 67mns at least three times a week.      It was a pleasure to see you and I look forward to continuing to work together on your health and well-being. Please do not hesitate to call the office if you need care or have questions about your care.   Have a wonderful day and week. With Gratitude, GAlvira MondayMSN, FNP-BC

## 2022-02-09 NOTE — Assessment & Plan Note (Addendum)
Controlled Encouraged to continue taking hydrochlorothiazide 12.5 mg daily No changes to medication regimen today We will follow up on 04/22/2022 BP Readings from Last 3 Encounters:  02/09/22 138/72  02/08/22 128/82  02/03/22 (!) 157/87

## 2022-02-09 NOTE — Progress Notes (Signed)
Established Patient Office Visit  Subjective:  Patient ID: Norma Horton, female    DOB: 10-Feb-1971  Age: 51 y.o. MRN: [id number]  CC:  Chief Complaint  Patient presents with   Follow-up    2 week f/u for blood pressure, reports she has been doing well.Saw Dr. Dorris Fetch regarding tumor, she will have it removed next year unless sx worsen.     HPI Norma Horton is a 51 y.o. female with past medical history of hypertension, depression, and GERD presents for f/u of  chronic medical conditions.  Hypertension: She takes hydrochlorothiazide 12.5 mg daily.  She reports taking her medication this morning.  She denies headaches, dizziness, blurred vision, chest pain, and palpitations.  Past Medical History:  Diagnosis Date   Anxiety    Depression    Pt has had suicide ideations in the past.   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    Past Surgical History:  Procedure Laterality Date   CHOLECYSTECTOMY     TUBAL LIGATION      Family History  Problem Relation Age of Onset   Breast cancer Maternal Grandmother    Heart attack Father    Hypertension Father    COPD Mother    Diabetes Mellitus II Brother    Throat cancer Brother    Cerebral aneurysm Sister    Cervical cancer Sister    Cervical cancer Sister    Heart attack Sister    Diabetes Daughter    Asthma Daughter    Cystic fibrosis Daughter    Hypertension Other     Social History   Socioeconomic History   Marital status: Married    Spouse name: Not on file   Number of children: 4   Years of education: Not on file   Highest education level: Not on file  Occupational History   Not on file  Tobacco Use   Smoking status: Never   Smokeless tobacco: Never  Vaping Use   Vaping Use: Never used  Substance and Sexual Activity   Alcohol use: Yes    Comment: occ   Drug use: No   Sexual activity: Yes    Birth control/protection: Surgical, Post-menopausal     Comment: tubal  Other Topics Concern   Not on file  Social History Narrative   Lives with her husband and her children    Social Determinants of Health   Financial Resource Strain: Medium Risk (02/03/2022)   Overall Financial Resource Strain (CARDIA)    Difficulty of Paying Living Expenses: Somewhat hard  Food Insecurity: No Food Insecurity (02/03/2022)   Hunger Vital Sign    Worried About Running Out of Food in the Last Year: Never true    Ran Out of Food in the Last Year: Never true  Transportation Needs: No Transportation Needs (02/03/2022)   PRAPARE - Hydrologist (Medical): No    Lack of Transportation (Non-Medical): No  Physical Activity: Inactive (02/03/2022)   Exercise Vital Sign    Days of Exercise per Week: 0 days    Minutes of Exercise per Session: 0 min  Stress: Stress Concern Present (02/03/2022)   [place]    Feeling of Stress : Very much  Social Connections: Moderately Isolated (02/03/2022)   Social Connection and Isolation Panel [NHANES]    Frequency of Communication with Friends and Family:  More than three times a week    Frequency of Social Gatherings with Friends and Family: Twice a week    Attends Religious Services: Never    Marine scientist or Organizations: No    Attends Archivist Meetings: Never    Marital Status: Married  Human resources officer Violence: Not At Risk (02/03/2022)   Humiliation, Afraid, Rape, and Kick questionnaire    Fear of Current or Ex-Partner: No    Emotionally Abused: No    Physically Abused: No    Sexually Abused: No    Outpatient Medications Prior to Visit  Medication Sig Dispense Refill   albuterol (VENTOLIN HFA) 108 (90 Base) MCG/ACT inhaler INHALE 2 PUFFS BY MOUTH EVERY 6 HOURS AS NEEDED FOR WHEEZING AND FOR SHORTNESS OF BREATH 7 each 0   budesonide-formoterol (SYMBICORT) 80-4.5 MCG/ACT inhaler Inhale 2 puffs by mouth twice  daily 11 g 0   Cholecalciferol (VITAMIN D3) 125 MCG (5000 UT) CAPS Take 1 capsule (5,000 Units total) by mouth daily. 90 capsule 0   clonazePAM (KLONOPIN) 0.5 MG tablet Take 0.5 mg by mouth 3 (three) times daily as needed.     gabapentin (NEURONTIN) 100 MG capsule Take 1 capsule by mouth at bedtime 30 capsule 0   hydrochlorothiazide (MICROZIDE) 12.5 MG capsule Take 1 capsule (12.5 mg total) by mouth daily. 90 capsule 0   hydrOXYzine (VISTARIL) 25 MG capsule TAKE 1 CAPSULE BY MOUTH EVERY 8 HOURS AS NEEDED 30 capsule 0   metFORMIN (GLUCOPHAGE) 500 MG tablet TAKE 1 TABLET BY MOUTH TWICE DAILY WITH A MEAL 60 tablet 0   naproxen (NAPROSYN) 500 MG tablet TAKE 1 TABLET BY MOUTH TWICE DAILY WITH A MEAL 30 tablet 0   pantoprazole (PROTONIX) 40 MG tablet Take 1 tablet by mouth once daily 30 tablet 0   valACYclovir (VALTREX) 500 MG tablet Take 500 mg by mouth 1 day or 1 dose.     No facility-administered medications prior to visit.    Allergies  Allergen Reactions   Fenoprofen Calcium     ROS Review of Systems  Constitutional:  Negative for fatigue and fever.  Respiratory:  Negative for chest tightness and shortness of breath.   Cardiovascular:  Negative for chest pain and palpitations.  Skin:  Negative for rash and wound.  Neurological:  Negative for dizziness and headaches.      Objective:    Physical Exam HENT:     Head: Normocephalic.  Cardiovascular:     Rate and Rhythm: Normal rate and regular rhythm.     Pulses: Normal pulses.     Heart sounds: Normal heart sounds.  Pulmonary:     Effort: Pulmonary effort is normal.     Breath sounds: Normal breath sounds.  Neurological:     Mental Status: She is alert.     BP 138/72   Pulse 68   Ht 5' 3"  (1.6 m)   Wt (!) 358 lb 0.6 oz (162.4 kg)   LMP 08/30/2016   SpO2 96%   BMI 63.42 kg/m  Wt Readings from Last 3 Encounters:  02/09/22 (!) 358 lb 0.6 oz (162.4 kg)  02/08/22 (!) 359 lb 9.6 oz (163.1 kg)  02/03/22 (!) 357 lb 3.2 oz  (162 kg)    Lab Results  Component Value Date   TSH 3.900 01/21/2022   Lab Results  Component Value Date   WBC 8.9 11/06/2010   HGB 11.9 (L) 11/06/2010   HCT 37.6 11/06/2010   MCV 89.5 11/06/2010  PLT 256 11/06/2010   Lab Results  Component Value Date   NA 141 11/16/2021   K 5.0 11/16/2021   CO2 19 (L) 11/16/2021   GLUCOSE 135 (H) 11/16/2021   BUN 11 11/16/2021   CREATININE 0.59 11/16/2021   BILITOT 0.4 11/16/2021   ALKPHOS 99 11/16/2021   AST 30 11/16/2021   ALT 37 (H) 11/16/2021   PROT 7.9 11/16/2021   ALBUMIN 4.4 11/16/2021   CALCIUM 9.8 11/16/2021   EGFR 108 11/16/2021   Lab Results  Component Value Date   CHOL 154 01/21/2022   Lab Results  Component Value Date   HDL 54 01/21/2022   Lab Results  Component Value Date   LDLCALC 82 01/21/2022   Lab Results  Component Value Date   TRIG 98 01/21/2022   Lab Results  Component Value Date   CHOLHDL 2.9 01/21/2022   Lab Results  Component Value Date   HGBA1C 6.9 (H) 11/16/2021      Assessment & Plan:   Problem List Items Addressed This Visit       Cardiovascular and Mediastinum   Essential hypertension, benign    Controlled Encouraged to continue taking hydrochlorothiazide 12.5 mg daily No changes to medication regimen today We will follow up on 04/22/2022 BP Readings from Last 3 Encounters:  02/09/22 138/72  02/08/22 128/82  02/03/22 (!) 157/87         No orders of the defined types were placed in this encounter.   Follow-up: No follow-ups on file.    Alvira Monday, FNP

## 2022-02-13 DIAGNOSIS — J309 Allergic rhinitis, unspecified: Secondary | ICD-10-CM | POA: Diagnosis not present

## 2022-02-13 DIAGNOSIS — U071 COVID-19: Secondary | ICD-10-CM | POA: Diagnosis not present

## 2022-02-13 DIAGNOSIS — Z20822 Contact with and (suspected) exposure to covid-19: Secondary | ICD-10-CM | POA: Diagnosis not present

## 2022-02-25 NOTE — Addendum Note (Signed)
Addended byAlvira Monday on: 02/25/2022 05:45 PM   Modules accepted: Level of Service

## 2022-03-24 ENCOUNTER — Other Ambulatory Visit: Payer: Self-pay | Admitting: Family Medicine

## 2022-03-24 DIAGNOSIS — J4540 Moderate persistent asthma, uncomplicated: Secondary | ICD-10-CM

## 2022-03-28 ENCOUNTER — Telehealth: Payer: Self-pay | Admitting: Family Medicine

## 2022-03-28 NOTE — Telephone Encounter (Signed)
Patient called left voicemail 11/27 @ 9:36 am needs a doctor note saying patient is diabetic and must drink water and can not wear a black shirt to work because it makes the patient pass out.  Asked to fax note to [phone].

## 2022-03-29 NOTE — Telephone Encounter (Signed)
Please have the patient scheduled a televisit for further assessment of what is going on

## 2022-03-29 NOTE — Telephone Encounter (Signed)
Pt scheduled for 11/29 @ 11:20am

## 2022-03-30 ENCOUNTER — Encounter: Payer: Self-pay | Admitting: Family Medicine

## 2022-03-30 ENCOUNTER — Ambulatory Visit (INDEPENDENT_AMBULATORY_CARE_PROVIDER_SITE_OTHER): Payer: 59 | Admitting: Family Medicine

## 2022-03-30 DIAGNOSIS — E119 Type 2 diabetes mellitus without complications: Secondary | ICD-10-CM

## 2022-03-30 DIAGNOSIS — Z7984 Long term (current) use of oral hypoglycemic drugs: Secondary | ICD-10-CM | POA: Diagnosis not present

## 2022-03-30 NOTE — Progress Notes (Signed)
Virtual Visit via Telephone Note   This visit type was conducted via telephone. This format is felt to be most appropriate for this patient at this time.  The patient did not have access to video technology/had technical difficulties with video requiring transitioning to audio format only (telephone).  All issues noted in this document were discussed and addressed.  No physical exam could be performed with this format.  Evaluation Performed:  Follow-up visit  Date:  03/30/2022   ID:  Norma Horton, DOB Jun 27, 1970, MRN [id number]  Patient Location: Home Provider Location: Clinic  Participants: Patient Location of Patient: Home Location of Provider: Clinic Consent was obtain for visit to be over via telehealth. I verified that I am speaking with the correct person using two identifiers.  PCP:  Alvira Monday, FNP   Chief Complaint: Work note  History of Present Illness:    Norma Horton is a 51 y.o. female requesting a work note stating she is diabetic and is currently on metformin and needs to stay hydrated.  She reports that she cannot  wear the black T-shirt per the company's policy due to increased heat and syncope episodes when she wears the T-shirt.  She prefers wearing the white and blue T-shirt instead.  She works at [employer] in [employer].     The patient does not have symptoms concerning for COVID-19 infection (fever, chills, cough, or new shortness of breath).   Past Medical, Surgical, Social History, Allergies, and Medications have been Reviewed.  Past Medical History:  Diagnosis Date   Anxiety    Depression    Pt has had suicide ideations in the past.   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     Allergies:   Fenoprofen calcium   ROS:   Please see the history of present illness.     All other systems reviewed and are negative.   Labs/Other Tests and Data Reviewed:    Recent Labs: 11/16/2021: ALT 37; BUN 11; Creatinine, Ser 0.59; Potassium 5.0; Sodium 141 01/21/2022: TSH 3.900   Recent Lipid Panel Lab Results  Component Value Date/Time   CHOL 154 01/21/2022 09:44 AM   TRIG 98 01/21/2022 09:44 AM   HDL 54 01/21/2022 09:44 AM   CHOLHDL 2.9 01/21/2022 09:44 AM   LDLCALC 82 01/21/2022 09:44 AM    Wt Readings from Last 3 Encounters:  02/09/22 (!) 358 lb 0.6 oz (162.4 kg)  02/08/22 (!) 359 lb 9.6 oz (163.1 kg)  02/03/22 (!) 357 lb 3.2 oz (162 kg)     Objective:    Vital Signs:  LMP 08/30/2016      ASSESSMENT & PLAN:   Type 2 DM A work note will be provided for the above reasons The patient reports that her husband has a doctor's appointment on 04/01/2022 and will be able to pick up the work note  Time:   Today, I have spent 15 minutes reviewing the chart, including problem list, medications, and with the patient with telehealth technology discussing the above problems.   Medication Adjustments/Labs and Tests Ordered: Current medicines are reviewed at length with the patient today.  Concerns  regarding medicines are outlined above.   Tests Ordered: No orders of the defined types were placed in this encounter.   Medication Changes: No orders of the defined types were placed in this encounter.    Note: This dictation was prepared with Dragon dictation along with smaller phrase technology. Similar sounding words can be transcribed inadequately or may not be corrected upon review. Any transcriptional errors that result from this process are unintentional.      Disposition:  Follow up  Signed, Alvira Monday, FNP  03/30/2022 12:54 PM     [place]

## 2022-03-31 ENCOUNTER — Other Ambulatory Visit: Payer: Self-pay

## 2022-04-03 ENCOUNTER — Other Ambulatory Visit: Payer: Self-pay | Admitting: Nurse Practitioner

## 2022-04-12 ENCOUNTER — Other Ambulatory Visit: Payer: Self-pay | Admitting: Family Medicine

## 2022-04-12 DIAGNOSIS — M792 Neuralgia and neuritis, unspecified: Secondary | ICD-10-CM

## 2022-04-12 DIAGNOSIS — R059 Cough, unspecified: Secondary | ICD-10-CM | POA: Diagnosis not present

## 2022-04-12 DIAGNOSIS — Z20822 Contact with and (suspected) exposure to covid-19: Secondary | ICD-10-CM | POA: Diagnosis not present

## 2022-04-12 DIAGNOSIS — R062 Wheezing: Secondary | ICD-10-CM | POA: Diagnosis not present

## 2022-04-15 ENCOUNTER — Encounter: Payer: Self-pay | Admitting: Family Medicine

## 2022-04-15 ENCOUNTER — Ambulatory Visit (INDEPENDENT_AMBULATORY_CARE_PROVIDER_SITE_OTHER): Payer: 59 | Admitting: Family Medicine

## 2022-04-15 DIAGNOSIS — R058 Other specified cough: Secondary | ICD-10-CM | POA: Diagnosis not present

## 2022-04-15 MED ORDER — GUAIFENESIN 100 MG/5ML PO LIQD: 5.0000 mL | ORAL | 0 refills | Status: DC | PRN

## 2022-04-15 NOTE — Progress Notes (Signed)
Virtual Visit via Telephone Note   This visit type was conducted via telephone. This format is felt to be most appropriate for this patient at this time.  The patient did not have access to video technology/had technical difficulties with video requiring transitioning to audio format only (telephone).  All issues noted in this document were discussed and addressed.  No physical exam could be performed with this format.  Evaluation Performed:  Follow-up visit  Date:  04/15/2022   ID:  Norma Horton, DOB 11/16/1970, MRN [id number]  Patient Location: Home Provider Location: Clinic  Participants: Patient Location of Patient: Home Location of Provider: Clinic Consent was obtain for visit to be over via telehealth. I verified that I am speaking with the correct person using two identifiers.  PCP:  Alvira Monday, FNP   Chief Complaint: Cough and rhinorrhea  History of Present Illness:    Norma Horton is a 51 y.o. female with reports of cough and wheezing since 04/12/2022.  She was seen at urgent care and eating and was prescribed steroid taper, which she has completed.  She has a history of asthma and is compliant with her Symbicort inhaler and albuterol inhaler.  She denies shortness of breath, fever, and chills.    The patient does not have symptoms concerning for COVID-19 infection (fever, chills, cough, or new shortness of breath).   Past Medical, Surgical, Social History, Allergies, and Medications have been Reviewed.  Past Medical History:  Diagnosis Date   Anxiety    Depression    Pt has had suicide ideations in the past.   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   clonazePAM (KLONOPIN) 0.5 MG tablet Take 0.5 mg by mouth 3 (three) times daily as needed.   gabapentin (NEURONTIN) 100 MG capsule Take 1 capsule by mouth at bedtime   guaiFENesin (ROBITUSSIN) 100 MG/5ML liquid Take 5 mLs by mouth every 4 (four) hours as needed for cough or to loosen phlegm.   hydrochlorothiazide (MICROZIDE) 12.5 MG capsule Take 1 capsule by mouth once daily   hydrOXYzine (VISTARIL) 25 MG capsule TAKE 1 CAPSULE BY MOUTH EVERY 8 HOURS AS NEEDED   metFORMIN (GLUCOPHAGE) 500 MG tablet TAKE 1 TABLET BY MOUTH TWICE DAILY WITH A MEAL   naproxen (NAPROSYN) 500 MG tablet TAKE 1 TABLET BY MOUTH TWICE DAILY WITH A MEAL   pantoprazole (PROTONIX) 40 MG tablet Take 1 tablet by mouth once daily   valACYclovir (VALTREX) 500 MG tablet Take 500 mg by mouth 1 day or 1 dose.     Allergies:   Fenoprofen calcium   ROS:   Please see the history of present illness.     All other systems reviewed and are negative.   Labs/Other Tests and Data Reviewed:    Recent Labs: 11/16/2021: ALT 37; BUN 11; Creatinine, Ser 0.59; Potassium 5.0; Sodium 141 01/21/2022: TSH  3.900   Recent Lipid Panel Lab Results  Component Value Date/Time   CHOL 154 01/21/2022 09:44 AM   TRIG 98 01/21/2022 09:44 AM   HDL 54 01/21/2022 09:44 AM   CHOLHDL 2.9 01/21/2022 09:44 AM   LDLCALC 82 01/21/2022 09:44 AM    Wt Readings from Last 3 Encounters:  02/09/22 (!) 358 lb 0.6 oz (162.4 kg)  02/08/22 (!) 359 lb 9.6 oz (163.1 kg)  02/03/22 (!) 357 lb 3.2 oz (162 kg)     Objective:    Vital Signs:  LMP 08/30/2016      ASSESSMENT & PLAN:   Viral illness Robitussin order for cough Encouraged compliance with her Symbicort inhaler and the albuterol inhaler as needed Encourage patient to continue  taking hydralazine 25 mg for allergy symptoms Encouraged to stay well-hydrated  Time:   Today, I have spent 10 minutes reviewing the chart, including problem list, medications, and with the patient with telehealth technology discussing the above problems.   Medication Adjustments/Labs and Tests Ordered: Current medicines are reviewed at length with the patient today.  Concerns regarding medicines are outlined above.   Tests Ordered: No orders of the defined types were placed in this encounter.   Medication Changes: Meds ordered this encounter  Medications   guaiFENesin (ROBITUSSIN) 100 MG/5ML liquid    Sig: Take 5 mLs by mouth every 4 (four) hours as needed for cough or to loosen phlegm.    Dispense:  120 mL    Refill:  0     Note: This dictation was prepared with Dragon dictation along with smaller phrase technology. Similar sounding words can be transcribed inadequately or may not be corrected upon review. Any transcriptional errors that result from this process are unintentional.      Disposition:  Follow up  Signed, Alvira Monday, FNP  04/15/2022 12:07 PM     [place]

## 2022-04-18 ENCOUNTER — Telehealth: Payer: Self-pay | Admitting: Family Medicine

## 2022-04-18 NOTE — Telephone Encounter (Signed)
Patient called back requesting cough medication, she clarified that she uses [facility] in [place].  Pt's # [phone]

## 2022-04-18 NOTE — Telephone Encounter (Signed)
Patient was seen last week and no cough medicine was called into her pharmacy     Pharmacy: [facility]

## 2022-04-18 NOTE — Telephone Encounter (Signed)
Called patient to let her know robitussin was called into [facility] in [place] on 12/15

## 2022-04-22 ENCOUNTER — Encounter: Payer: Self-pay | Admitting: Family Medicine

## 2022-04-22 ENCOUNTER — Ambulatory Visit (INDEPENDENT_AMBULATORY_CARE_PROVIDER_SITE_OTHER): Payer: 59 | Admitting: Family Medicine

## 2022-04-22 VITALS — BP 134/80 | HR 65 | Ht 63.0 in | Wt 356.0 lb

## 2022-04-22 DIAGNOSIS — Z23 Encounter for immunization: Secondary | ICD-10-CM | POA: Diagnosis not present

## 2022-04-22 DIAGNOSIS — Z114 Encounter for screening for human immunodeficiency virus [HIV]: Secondary | ICD-10-CM | POA: Diagnosis not present

## 2022-04-22 DIAGNOSIS — Z1159 Encounter for screening for other viral diseases: Secondary | ICD-10-CM

## 2022-04-22 DIAGNOSIS — E038 Other specified hypothyroidism: Secondary | ICD-10-CM | POA: Diagnosis not present

## 2022-04-22 DIAGNOSIS — J45901 Unspecified asthma with (acute) exacerbation: Secondary | ICD-10-CM | POA: Insufficient documentation

## 2022-04-22 DIAGNOSIS — R69 Illness, unspecified: Secondary | ICD-10-CM | POA: Diagnosis not present

## 2022-04-22 DIAGNOSIS — E559 Vitamin D deficiency, unspecified: Secondary | ICD-10-CM | POA: Diagnosis not present

## 2022-04-22 DIAGNOSIS — J4541 Moderate persistent asthma with (acute) exacerbation: Secondary | ICD-10-CM

## 2022-04-22 DIAGNOSIS — I10 Essential (primary) hypertension: Secondary | ICD-10-CM | POA: Diagnosis not present

## 2022-04-22 DIAGNOSIS — E7849 Other hyperlipidemia: Secondary | ICD-10-CM | POA: Diagnosis not present

## 2022-04-22 DIAGNOSIS — E119 Type 2 diabetes mellitus without complications: Secondary | ICD-10-CM | POA: Diagnosis not present

## 2022-04-22 DIAGNOSIS — K219 Gastro-esophageal reflux disease without esophagitis: Secondary | ICD-10-CM | POA: Diagnosis not present

## 2022-04-22 MED ORDER — METHYLPREDNISOLONE 4 MG PO TBPK: ORAL_TABLET | ORAL | 0 refills | Status: DC

## 2022-04-22 MED ORDER — PANTOPRAZOLE SODIUM 40 MG PO TBEC: 40.0000 mg | DELAYED_RELEASE_TABLET | ORAL | 0 refills | Status: DC | PRN

## 2022-04-22 NOTE — Assessment & Plan Note (Addendum)
She reports compliance with her Symbicort inhaler daily and her albuterol inhaler as needed She reports frequent cough, and wheezing the last 2 weeks She was seen in urgent care and 2 weeks ago and was given prednisone for 3 days She reports mild relief of her symptoms, and  complains of chest congestion with frequent coughing, especially at nighttime She reports frequent use of albuterol inhaler, which helps with her wheezing She reports not feeling well, denies fever and chills Reports negative COVID and flu test Will provide another dose of steroid taper today Encouraged to continue Robitussin for cough Take medication as prescribed. Increase fluids and allow for plenty of rest. Recommend Tylenol or ibuprofen as needed for pain, fever, or general discomfort. Recommend using a humidifier at bedtime during sleep to help with cough and nasal congestion. Follow-up if your symptoms do not improve

## 2022-04-22 NOTE — Assessment & Plan Note (Signed)
Stable with Protonix 40 mg PRN She denies dyspepsia and heartburn  Encouraged to continue treatment regiment

## 2022-04-22 NOTE — Patient Instructions (Signed)
I appreciate the opportunity to provide care to you today!    Follow up:  3 months  Labs: please stop by the lab today to get your blood drawn (CBC, CMP, TSH, Lipid profile, HgA1c, Vit D)  Screening: HIV and Hep C  Please pick up your medication at the pharmacy Take medication as prescribed Continue to take Robitussin as needed for cough Increase fluids and allow for plenty of rest. Recommend Tylenol or ibuprofen as needed for pain, fever, or general discomfort. Warm salt water gargles 3-4 times daily to help with throat pain or discomfort. Recommend using a humidifier at bedtime during sleep to help with cough and nasal congestion. Follow-up if your symptoms do not improve     Please continue to a heart-healthy diet and increase your physical activities. Try to exercise for 73mns at least five times a week.      It was a pleasure to see you and I look forward to continuing to work together on your health and well-being. Please do not hesitate to call the office if you need care or have questions about your care.   Have a wonderful day and week. With Gratitude, GAlvira MondayMSN, FNP-BC

## 2022-04-22 NOTE — Assessment & Plan Note (Signed)
Controlled She takes hydrochlorothiazide 12.5 mg daily She denies dizziness, blurred vision, chest pain, palpitation Encouraged to continue treatment regimen BP Readings from Last 3 Encounters:  04/22/22 134/80  02/09/22 138/72  02/08/22 128/82

## 2022-04-22 NOTE — Progress Notes (Signed)
Established Patient Office Visit  Subjective:  Patient ID: Norma Horton, female    DOB: [birth year]  Age: 51 y.o. MRN: [id number]  CC:  Chief Complaint  Patient presents with   Hypertension    Patient is here for f/u of HTN. Has no issues or side effects.    Cough    Patient complains of continued cough and fatigue.     HPI Norma Horton is a 51 y.o. female with past medical history of type 2 diabetes, GERD, hypertension, asthma, anxiety and depression presents for f/u of  chronic medical conditions. For the details of today's visit, please refer to the assessment and plan.     Past Medical History:  Diagnosis Date   Anxiety    Depression    Pt has had suicide ideations in the past.   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    Past Surgical History:  Procedure Laterality Date   CHOLECYSTECTOMY     TUBAL LIGATION      Family History  Problem Relation Age of Onset   Breast cancer Maternal Grandmother    Heart attack Father    Hypertension Father    COPD Mother    Diabetes Mellitus II Brother    Throat cancer Brother    Cerebral aneurysm Sister    Cervical cancer Sister    Cervical cancer Sister    Heart attack Sister    Diabetes Daughter    Asthma Daughter    Cystic fibrosis Daughter    Hypertension Other     Social History   Socioeconomic History   Marital status: Married    Spouse name: Not on file   Number of children: 4   Years of education: Not on file   Highest education level: Not on file  Occupational History   Not on file  Tobacco Use   Smoking status: Never   Smokeless tobacco: Never  Vaping Use   Vaping Use: Never used  Substance and Sexual Activity   Alcohol use: Yes    Comment: occ   Drug use: No   Sexual activity: Yes    Birth control/protection: Surgical, Post-menopausal    Comment: tubal  Other Topics Concern   Not on file  Social History Narrative   Lives with her  husband and her children    Social Determinants of Health   Financial Resource Strain: Medium Risk (02/03/2022)   Overall Financial Resource Strain (CARDIA)    Difficulty of Paying Living Expenses: Somewhat hard  Food Insecurity: No Food Insecurity (02/03/2022)   Hunger Vital Sign    Worried About Running Out of Food in the Last Year: Never true    Ran Out of Food in the Last Year: Never true  Transportation Needs: No Transportation Needs (02/03/2022)   PRAPARE - Hydrologist (Medical): No    Lack of Transportation (Non-Medical): No  Physical Activity: Inactive (02/03/2022)   Exercise Vital Sign    Days of Exercise per Week: 0 days    Minutes of Exercise per Session: 0 min  Stress: Stress Concern Present (02/03/2022)   [place]    Feeling of Stress : Very much  Social Connections: Moderately Isolated (02/03/2022)   Social Connection and Isolation Panel [NHANES]    Frequency of Communication with Friends and Family: More than three times a  week    Frequency of Social Gatherings with Friends and Family: Twice a week    Attends Religious Services: Never    Marine scientist or Organizations: No    Attends Archivist Meetings: Never    Marital Status: Married  Human resources officer Violence: Not At Risk (02/03/2022)   Humiliation, Afraid, Rape, and Kick questionnaire    Fear of Current or Ex-Partner: No    Emotionally Abused: No    Physically Abused: No    Sexually Abused: No    Outpatient Medications Prior to Visit  Medication Sig Dispense Refill   albuterol (VENTOLIN HFA) 108 (90 Base) MCG/ACT inhaler INHALE 2 PUFFS BY MOUTH EVERY 6 HOURS AS NEEDED FOR WHEEZING AND FOR SHORTNESS OF BREATH 7 each 0   budesonide-formoterol (SYMBICORT) 80-4.5 MCG/ACT inhaler Inhale 2 puffs by mouth twice daily 11 g 0   Cholecalciferol (VITAMIN D3) 125 MCG (5000 UT) CAPS Take 1 capsule (5,000 Units  total) by mouth daily. 90 capsule 0   clonazePAM (KLONOPIN) 0.5 MG tablet Take 0.5 mg by mouth 3 (three) times daily as needed.     gabapentin (NEURONTIN) 100 MG capsule Take 1 capsule by mouth at bedtime 30 capsule 0   guaiFENesin (ROBITUSSIN) 100 MG/5ML liquid Take 5 mLs by mouth every 4 (four) hours as needed for cough or to loosen phlegm. 120 mL 0   hydrochlorothiazide (MICROZIDE) 12.5 MG capsule Take 1 capsule by mouth once daily 90 capsule 0   hydrOXYzine (VISTARIL) 25 MG capsule TAKE 1 CAPSULE BY MOUTH EVERY 8 HOURS AS NEEDED 30 capsule 0   metFORMIN (GLUCOPHAGE) 500 MG tablet TAKE 1 TABLET BY MOUTH TWICE DAILY WITH A MEAL 60 tablet 0   naproxen (NAPROSYN) 500 MG tablet TAKE 1 TABLET BY MOUTH TWICE DAILY WITH A MEAL 30 tablet 0   valACYclovir (VALTREX) 500 MG tablet Take 500 mg by mouth 1 day or 1 dose.     pantoprazole (PROTONIX) 40 MG tablet Take 1 tablet by mouth once daily 30 tablet 0   No facility-administered medications prior to visit.    Allergies  Allergen Reactions   Fenoprofen Calcium     ROS Review of Systems  Constitutional:  Negative for chills and fever.  HENT:  Positive for congestion. Negative for rhinorrhea, sinus pressure, sinus pain, sneezing and sore throat.   Eyes:  Negative for visual disturbance.  Respiratory:  Positive for cough. Negative for chest tightness and shortness of breath.   Gastrointestinal:  Negative for diarrhea, nausea and vomiting.  Neurological:  Negative for dizziness and headaches.      Objective:    Physical Exam HENT:     Head: Normocephalic.     Nose: Congestion present.     Mouth/Throat:     Mouth: Mucous membranes are moist.  Cardiovascular:     Rate and Rhythm: Normal rate.     Heart sounds: Normal heart sounds.  Pulmonary:     Effort: Pulmonary effort is normal.     Breath sounds: Normal breath sounds.  Neurological:     Mental Status: She is alert.     BP 134/80   Pulse 65   Ht _0  (1.6 m)   Wt (!) 356 lb  (161.5 kg)   LMP 08/30/2016   SpO2 94%   BMI 63.06 kg/m  Wt Readings from Last 3 Encounters:  04/22/22 (!) 356 lb (161.5 kg)  02/09/22 (!) 358 lb 0.6 oz (162.4 kg)  02/08/22 (!) 359 lb 9.6  oz (163.1 kg)    Lab Results  Component Value Date   TSH 3.900 01/21/2022   Lab Results  Component Value Date   WBC 8.9 11/06/2010   HGB 11.9 (L) 11/06/2010   HCT 37.6 11/06/2010   MCV 89.5 11/06/2010   PLT 256 11/06/2010   Lab Results  Component Value Date   NA 141 11/16/2021   K 5.0 11/16/2021   CO2 19 (L) 11/16/2021   GLUCOSE 135 (H) 11/16/2021   BUN 11 11/16/2021   CREATININE 0.59 11/16/2021   BILITOT 0.4 11/16/2021   ALKPHOS 99 11/16/2021   AST 30 11/16/2021   ALT 37 (H) 11/16/2021   PROT 7.9 11/16/2021   ALBUMIN 4.4 11/16/2021   CALCIUM 9.8 11/16/2021   EGFR 108 11/16/2021   Lab Results  Component Value Date   CHOL 154 01/21/2022   Lab Results  Component Value Date   HDL 54 01/21/2022   Lab Results  Component Value Date   LDLCALC 82 01/21/2022   Lab Results  Component Value Date   TRIG 98 01/21/2022   Lab Results  Component Value Date   CHOLHDL 2.9 01/21/2022   Lab Results  Component Value Date   HGBA1C 6.9 (H) 11/16/2021      Assessment & Plan:  Moderate persistent asthma with exacerbation -     methylPREDNISolone; Take as the package instructed  Dispense: 1 each; Refill: 0  Type 2 diabetes mellitus without complication, without long-term current use of insulin ([place]) Assessment & Plan: She takes metformin 500 mg twice daily She denies polyuria, polyphagia, polydipsia Encouraged to continue treatment regiment Will assess hemoglobin A1c today Lab Results  Component Value Date   HGBA1C 6.9 (H) 11/16/2021     Orders: -     Hemoglobin A1c  Gastroesophageal reflux disease, unspecified whether esophagitis present Assessment & Plan: Stable with Protonix 40 mg PRN She denies dyspepsia and heartburn  Encouraged to continue treatment  regiment  Orders: -     Pantoprazole Sodium; Take 1 tablet (40 mg total) by mouth as needed.  Dispense: 30 tablet; Refill: 0  Gastroesophageal reflux disease without esophagitis Assessment & Plan: Stable with Protonix 40 mg PRN She denies dyspepsia and heartburn  Encouraged to continue treatment regiment   Essential hypertension, benign Assessment & Plan: Controlled She takes hydrochlorothiazide 12.5 mg daily She denies dizziness, blurred vision, chest pain, palpitation Encouraged to continue treatment regimen BP Readings from Last 3 Encounters:  04/22/22 134/80  02/09/22 138/72  02/08/22 128/82     Orders: -     CMP14+EGFR -     CBC with Differential/Platelet  Other hyperlipidemia -     Lipid panel  Encounter for screening for HIV -     HIV Antibody (routine testing w rflx)  Need for hepatitis C screening test -     Hepatitis C antibody  Vitamin D deficiency -     VITAMIN D 25 Hydroxy (Vit-D Deficiency, Fractures)  Need for shingles vaccine -     Varicella-zoster vaccine IM  Other specified hypothyroidism -     TSH + free T4    Follow-up: Return in about 3 months (around 07/22/2022).   Alvira Monday, FNP

## 2022-04-22 NOTE — Assessment & Plan Note (Signed)
She takes metformin 500 mg twice daily She denies polyuria, polyphagia, polydipsia Encouraged to continue treatment regiment Will assess hemoglobin A1c today Lab Results  Component Value Date   HGBA1C 6.9 (H) 11/16/2021

## 2022-04-23 LAB — CBC WITH DIFFERENTIAL/PLATELET
Basophils Absolute: 0.1 10*3/uL (ref 0.0–0.2)
Basos: 1 %
EOS (ABSOLUTE): 0.4 10*3/uL (ref 0.0–0.4)
Eos: 5 %
Hematocrit: 47.1 % — ABNORMAL HIGH (ref 34.0–46.6)
Hemoglobin: 15.4 g/dL (ref 11.1–15.9)
Immature Grans (Abs): 0 10*3/uL (ref 0.0–0.1)
Immature Granulocytes: 1 %
Lymphocytes Absolute: 3 10*3/uL (ref 0.7–3.1)
Lymphs: 37 %
MCH: 29.7 pg (ref 26.6–33.0)
MCHC: 32.7 g/dL (ref 31.5–35.7)
MCV: 91 fL (ref 79–97)
Monocytes Absolute: 0.6 10*3/uL (ref 0.1–0.9)
Monocytes: 7 %
Neutrophils Absolute: 4.1 10*3/uL (ref 1.4–7.0)
Neutrophils: 49 %
Platelets: 261 10*3/uL (ref 150–450)
RBC: 5.19 x10E6/uL (ref 3.77–5.28)
RDW: 12.4 % (ref 11.7–15.4)
WBC: 8 10*3/uL (ref 3.4–10.8)

## 2022-04-23 LAB — LIPID PANEL
Chol/HDL Ratio: 3.2 ratio (ref 0.0–4.4)
Cholesterol, Total: 171 mg/dL (ref 100–199)
HDL: 54 mg/dL (ref >39)
LDL Chol Calc (NIH): 97 mg/dL (ref 0–99)
Triglycerides: 113 mg/dL (ref 0–149)
VLDL Cholesterol Cal: 20 mg/dL (ref 5–40)

## 2022-04-23 LAB — HEPATITIS C ANTIBODY: Hep C Virus Ab: NONREACTIVE

## 2022-04-23 LAB — TSH+FREE T4
Free T4: 1.21 ng/dL (ref 0.82–1.77)
TSH: 2.75 u[IU]/mL (ref 0.450–4.500)

## 2022-04-23 LAB — CMP14+EGFR
ALT: 28 IU/L (ref 0–32)
AST: 23 IU/L (ref 0–40)
Albumin/Globulin Ratio: 1.3 (ref 1.2–2.2)
Albumin: 4.1 g/dL (ref 3.8–4.9)
Alkaline Phosphatase: 96 IU/L (ref 44–121)
BUN/Creatinine Ratio: 21 (ref 9–23)
BUN: 15 mg/dL (ref 6–24)
Bilirubin Total: 0.7 mg/dL (ref 0.0–1.2)
CO2: 22 mmol/L (ref 20–29)
Calcium: 9.6 mg/dL (ref 8.7–10.2)
Chloride: 104 mmol/L (ref 96–106)
Creatinine, Ser: 0.7 mg/dL (ref 0.57–1.00)
Globulin, Total: 3.1 g/dL (ref 1.5–4.5)
Glucose: 119 mg/dL — ABNORMAL HIGH (ref 70–99)
Potassium: 4.3 mmol/L (ref 3.5–5.2)
Sodium: 142 mmol/L (ref 134–144)
Total Protein: 7.2 g/dL (ref 6.0–8.5)
eGFR: 103 mL/min/{1.73_m2} (ref >59)

## 2022-04-23 LAB — HIV ANTIBODY (ROUTINE TESTING W REFLEX): HIV Screen 4th Generation wRfx: NONREACTIVE

## 2022-04-23 LAB — VITAMIN D 25 HYDROXY (VIT D DEFICIENCY, FRACTURES): Vit D, 25-Hydroxy: 9.8 ng/mL — ABNORMAL LOW (ref 30.0–100.0)

## 2022-04-23 LAB — HEMOGLOBIN A1C
Est. average glucose Bld gHb Est-mCnc: 154 mg/dL
Hgb A1c MFr Bld: 7 % — ABNORMAL HIGH (ref 4.8–5.6)

## 2022-04-25 ENCOUNTER — Other Ambulatory Visit: Payer: Self-pay | Admitting: Family Medicine

## 2022-04-25 DIAGNOSIS — J4540 Moderate persistent asthma, uncomplicated: Secondary | ICD-10-CM

## 2022-04-26 ENCOUNTER — Other Ambulatory Visit: Payer: Self-pay | Admitting: Family Medicine

## 2022-04-26 DIAGNOSIS — E119 Type 2 diabetes mellitus without complications: Secondary | ICD-10-CM

## 2022-04-26 DIAGNOSIS — E559 Vitamin D deficiency, unspecified: Secondary | ICD-10-CM

## 2022-04-26 MED ORDER — VITAMIN D3 125 MCG (5000 UT) PO CAPS: 5000.0000 [IU] | ORAL_CAPSULE | Freq: Every day | ORAL | 0 refills | Status: DC

## 2022-04-26 MED ORDER — METFORMIN HCL 1000 MG PO TABS: 1000.0000 mg | ORAL_TABLET | Freq: Two times a day (BID) | ORAL | 3 refills | Status: DC

## 2022-04-26 NOTE — Telephone Encounter (Signed)
Kindly refill

## 2022-04-26 NOTE — Progress Notes (Signed)
  Please inform the patient that I will refill her vitamin D supplement and encourage her to take it because her vitamin D is low.  I have also increased her metformin to 1000 mg twice daily.  Her hemoglobin A1c increased from 6.9 to 7.0.  I recommend low sugar intake with increased physical activity.  All other labs are stable.

## 2022-04-27 ENCOUNTER — Other Ambulatory Visit: Payer: Self-pay

## 2022-04-27 DIAGNOSIS — G479 Sleep disorder, unspecified: Secondary | ICD-10-CM

## 2022-04-27 MED ORDER — HYDROXYZINE PAMOATE 25 MG PO CAPS: ORAL_CAPSULE | ORAL | 0 refills | Status: DC

## 2022-05-03 ENCOUNTER — Other Ambulatory Visit: Payer: Self-pay | Admitting: Family Medicine

## 2022-05-03 DIAGNOSIS — G479 Sleep disorder, unspecified: Secondary | ICD-10-CM

## 2022-05-03 DIAGNOSIS — J4540 Moderate persistent asthma, uncomplicated: Secondary | ICD-10-CM

## 2022-05-03 DIAGNOSIS — M792 Neuralgia and neuritis, unspecified: Secondary | ICD-10-CM

## 2022-05-09 ENCOUNTER — Other Ambulatory Visit: Payer: Self-pay | Admitting: Family Medicine

## 2022-05-09 DIAGNOSIS — M792 Neuralgia and neuritis, unspecified: Secondary | ICD-10-CM

## 2022-05-26 ENCOUNTER — Other Ambulatory Visit: Payer: Self-pay | Admitting: Family Medicine

## 2022-05-26 DIAGNOSIS — J4540 Moderate persistent asthma, uncomplicated: Secondary | ICD-10-CM

## 2022-06-06 ENCOUNTER — Ambulatory Visit: Payer: 59 | Admitting: Internal Medicine

## 2022-06-21 ENCOUNTER — Ambulatory Visit (INDEPENDENT_AMBULATORY_CARE_PROVIDER_SITE_OTHER): Payer: 59 | Admitting: Internal Medicine

## 2022-06-21 ENCOUNTER — Encounter: Payer: Self-pay | Admitting: Internal Medicine

## 2022-06-21 VITALS — BP 156/83 | HR 73 | Ht 63.0 in | Wt 361.8 lb

## 2022-06-21 DIAGNOSIS — R600 Localized edema: Secondary | ICD-10-CM | POA: Diagnosis not present

## 2022-06-21 DIAGNOSIS — I10 Essential (primary) hypertension: Secondary | ICD-10-CM

## 2022-06-21 MED ORDER — CHLORTHALIDONE 25 MG PO TABS: 25.0000 mg | ORAL_TABLET | Freq: Every day | ORAL | 2 refills | Status: DC

## 2022-06-21 NOTE — Assessment & Plan Note (Signed)
Evaluated today for ER follow-up after presenting to [facility] on 1/27 for evaluation of bilateral lower extremity edema.  1+ bilateral lower extremity edema remains on exam today.  Cardiopulmonary exam is unremarkable.  Her persistent edema is likely related to venous insufficiency and morbid obesity.  She is also sedentary and not exercising regularly. -Discontinue HCTZ and start chlorthalidone 25 mg daily -I have recommended regular use of compression stockings and advised that she incorporate regular exercise into her weekly routine -Repeat labs been ordered today -If there is no improvement in her symptoms, or they worsen, recommend further cardiology workup

## 2022-06-21 NOTE — Patient Instructions (Signed)
It was a pleasure to see you today.  Thank you for giving [place] the opportunity to be involved in your care.  Below is a brief recap of your visit and next steps.  We will plan to see you again in March.  Summary STOP HCTZ  Start chlorthalidone 25 mg daily  I recommend purchasing compression stockings at your pharmacy to wear during the day Try to get regular exercise to strengthen the muscles in your legs Repeat labs ordered today Follow up with [facility] on 3/25.

## 2022-06-21 NOTE — Assessment & Plan Note (Signed)
BP is elevated today, 156/83.  She is currently prescribed HCTZ 12.5 mg daily.  Goal BP is < 130/80.  Her blood pressure has been consistently above goal at recent appointments. -Discontinue HCTZ and start chlorthalidone 25 mg daily -Return to care for HTN follow-up at previously scheduled appointment on 3/25

## 2022-06-21 NOTE — Progress Notes (Signed)
Established Patient Office Visit  Subjective   Patient ID: Norma Horton, female    DOB: 1970/07/25  Age: 52 y.o. MRN: [id number]  Chief Complaint  Patient presents with   Follow-up    Patient following up from being in the emergency room for swelling   [name] presents today for ER follow-up.  She presented to [facility] ED on 1/27 endorsing bilateral lower extremity edema.  She denied dyspnea on exertion and orthopnea/PND.  Her labs were unremarkable at that time and not concerning for a cardiopulmonary etiology of her symptoms.  She was diuresed and instructed to increase HCTZ to 25 mg daily x 3 days before returning to her regular dose.  There have otherwise been no acute interval events.  [name] continues to endorse bilateral lower extremity edema today, but states that it is better than the time of her ER presentation.  She denies symptoms of orthopnea/PND and dyspnea on exertion.  She does however endorse that she is unable to sleep on her back because of discomfort and an inability to breathe, however this is not a new issue and has not significantly worsened recently.  She has returned to taking HCTZ 12.5 mg daily.  Past Medical History:  Diagnosis Date   Anxiety    Depression    Pt has had suicide ideations in the past.   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   Past Surgical History:  Procedure Laterality Date   CHOLECYSTECTOMY     TUBAL LIGATION     Social History   Tobacco Use   Smoking status: Never   Smokeless tobacco: Never  Vaping Use   Vaping Use: Never used  Substance Use Topics   Alcohol use: Yes    Comment: occ   Drug use: No   Family History  Problem Relation Age of Onset   Breast cancer Maternal Grandmother    Heart attack Father    Hypertension Father    COPD Mother    Diabetes Mellitus II Brother    Throat cancer Brother    Cerebral aneurysm Sister    Cervical cancer Sister     Cervical cancer Sister    Heart attack Sister    Diabetes Daughter    Asthma Daughter    Cystic fibrosis Daughter    Hypertension Other    Allergies  Allergen Reactions   Fenoprofen Calcium    Review of Systems  Cardiovascular:  Positive for leg swelling.  All other systems reviewed and are negative.    Objective:     BP (!) 156/83 (BP Location: Right Arm, Patient Position: Sitting, Cuff Size: Large)   Pulse 73   Ht 5' 3"$  (1.6 m)   Wt (!) 361 lb 12.8 oz (164.1 kg)   LMP 08/30/2016   SpO2 95%   BMI 64.09 kg/m  BP Readings from Last 3 Encounters:  06/21/22 (!) 156/83  04/22/22 134/80  02/09/22 138/72   Physical Exam Vitals reviewed.  Constitutional:      General: She is not in acute distress.    Appearance: Normal appearance. She is obese. She is not toxic-appearing.  HENT:     Head: Normocephalic and atraumatic.     Right Ear: External ear normal.     Left Ear: External ear normal.     Nose: Nose normal. No congestion or rhinorrhea.     Mouth/Throat:  Mouth: Mucous membranes are moist.     Pharynx: Oropharynx is clear. No oropharyngeal exudate or posterior oropharyngeal erythema.  Eyes:     General: No scleral icterus.    Extraocular Movements: Extraocular movements intact.     Conjunctiva/sclera: Conjunctivae normal.     Pupils: Pupils are equal, round, and reactive to light.  Cardiovascular:     Rate and Rhythm: Normal rate and regular rhythm.     Pulses: Normal pulses.     Heart sounds: Normal heart sounds. No murmur heard.    No friction rub. No gallop.  Pulmonary:     Effort: Pulmonary effort is normal.     Breath sounds: Normal breath sounds. No wheezing, rhonchi or rales.  Abdominal:     General: Abdomen is flat. Bowel sounds are normal. There is no distension.     Palpations: Abdomen is soft.     Tenderness: There is no abdominal tenderness.  Musculoskeletal:        General: No swelling. Normal range of motion.     Cervical back: Normal range  of motion.     Right lower leg: Edema (1+) present.     Left lower leg: Edema (1+) present.  Lymphadenopathy:     Cervical: No cervical adenopathy.  Skin:    General: Skin is warm and dry.     Capillary Refill: Capillary refill takes less than 2 seconds.     Coloration: Skin is not jaundiced.  Neurological:     General: No focal deficit present.     Mental Status: She is alert and oriented to person, place, and time.  Psychiatric:        Mood and Affect: Mood normal.        Behavior: Behavior normal.   Last CBC Lab Results  Component Value Date   WBC 8.0 04/22/2022   HGB 15.4 04/22/2022   HCT 47.1 (H) 04/22/2022   MCV 91 04/22/2022   MCH 29.7 04/22/2022   RDW 12.4 04/22/2022   PLT 261 [id number]   Last metabolic panel Lab Results  Component Value Date   GLUCOSE 119 (H) 04/22/2022   NA 142 04/22/2022   K 4.3 04/22/2022   CL 104 04/22/2022   CO2 22 04/22/2022   BUN 15 04/22/2022   CREATININE 0.70 04/22/2022   EGFR 103 04/22/2022   CALCIUM 9.6 04/22/2022   PROT 7.2 04/22/2022   ALBUMIN 4.1 04/22/2022   LABGLOB 3.1 04/22/2022   AGRATIO 1.3 04/22/2022   BILITOT 0.7 04/22/2022   ALKPHOS 96 04/22/2022   AST 23 04/22/2022   ALT 28 04/22/2022   Last lipids Lab Results  Component Value Date   CHOL 171 04/22/2022   HDL 54 04/22/2022   LDLCALC 97 04/22/2022   TRIG 113 04/22/2022   CHOLHDL 3.2 04/22/2022   Last hemoglobin A1c Lab Results  Component Value Date   HGBA1C 7.0 (H) 04/22/2022   Last thyroid functions Lab Results  Component Value Date   TSH 2.750 04/22/2022   Last vitamin D Lab Results  Component Value Date   VD25OH 9.8 (L) 04/22/2022   The 10-year ASCVD risk score (Arnett DK, et al., 2019) is: 5.9%    Assessment & Plan:   Problem List Items Addressed This Visit       Essential hypertension, benign    BP is elevated today, 156/83.  She is currently prescribed HCTZ 12.5 mg daily.  Goal BP is < 130/80.  Her blood pressure has been  consistently above goal at  recent appointments. -Discontinue HCTZ and start chlorthalidone 25 mg daily -Return to care for HTN follow-up at previously scheduled appointment on 3/25      Bilateral lower extremity edema - Primary    Evaluated today for ER follow-up after presenting to [facility] on 1/27 for evaluation of bilateral lower extremity edema.  1+ bilateral lower extremity edema remains on exam today.  Cardiopulmonary exam is unremarkable.  Her persistent edema is likely related to venous insufficiency and morbid obesity.  She is also sedentary and not exercising regularly. -Discontinue HCTZ and start chlorthalidone 25 mg daily -I have recommended regular use of compression stockings and advised that she incorporate regular exercise into her weekly routine -Repeat labs been ordered today -If there is no improvement in her symptoms, or they worsen, recommend further cardiology workup      Return if symptoms worsen or fail to improve.   Johnette Abraham, MD

## 2022-06-23 LAB — BRAIN NATRIURETIC PEPTIDE: BNP: 37 pg/mL (ref 0.0–100.0)

## 2022-06-23 LAB — BASIC METABOLIC PANEL
BUN/Creatinine Ratio: 20 (ref 9–23)
BUN: 12 mg/dL (ref 6–24)
CO2: 25 mmol/L (ref 20–29)
Calcium: 9.8 mg/dL (ref 8.7–10.2)
Chloride: 101 mmol/L (ref 96–106)
Creatinine, Ser: 0.6 mg/dL (ref 0.57–1.00)
Glucose: 107 mg/dL — ABNORMAL HIGH (ref 70–99)
Potassium: 3.9 mmol/L (ref 3.5–5.2)
Sodium: 139 mmol/L (ref 134–144)
eGFR: 107 mL/min/{1.73_m2} (ref >59)

## 2022-06-26 ENCOUNTER — Other Ambulatory Visit: Payer: Self-pay | Admitting: Family Medicine

## 2022-06-26 DIAGNOSIS — J4540 Moderate persistent asthma, uncomplicated: Secondary | ICD-10-CM

## 2022-06-28 ENCOUNTER — Telehealth (INDEPENDENT_AMBULATORY_CARE_PROVIDER_SITE_OTHER): Payer: 59 | Admitting: Family Medicine

## 2022-06-28 ENCOUNTER — Encounter: Payer: Self-pay | Admitting: Family Medicine

## 2022-06-28 DIAGNOSIS — B349 Viral infection, unspecified: Secondary | ICD-10-CM

## 2022-06-28 MED ORDER — PROMETHAZINE-DM 6.25-15 MG/5ML PO SYRP: 5.0000 mL | ORAL_SOLUTION | Freq: Four times a day (QID) | ORAL | 0 refills | Status: DC | PRN

## 2022-06-28 NOTE — Progress Notes (Signed)
Virtual Visit via Video Note  I connected with Norma Horton on 06/28/22 at  1:40 PM EST by a video enabled telemedicine application and verified that I am speaking with the correct person using two identifiers.  Patient Location: Home Provider Location: Office/Clinic  I discussed the limitations, risks, security, and privacy concerns of performing an evaluation and management service by video and the availability of in person appointments. I also discussed with the patient that there may be a patient responsible charge related to this service. The patient expressed understanding and agreed to proceed.  Subjective: PCP: Alvira Monday, FNP  Chief Complaint  Patient presents with   Nasal Congestion    Pt reports cough and congestion sx started on 06/26/2022.    HPI The patient is in today complaining of cough and congestion since 04/26/2023.  She denies fever, chills, headaches, body aches, fatigue, sore throat, and trouble breathing.  She denies symptoms of facial pressure and pain.  She has not taken the medication yet.     ROS: Per HPI  Current Outpatient Medications:    albuterol (VENTOLIN HFA) 108 (90 Base) MCG/ACT inhaler, INHALE 2 PUFFS BY MOUTH EVERY 6 HOURS AS NEEDED FOR WHEEZING AND FOR SHORTNESS OF BREATH, Disp: 7 each, Rfl: 0   BREYNA 80-4.5 MCG/ACT inhaler, Inhale 2 puffs by mouth twice daily, Disp: 11 g, Rfl: 0   chlorthalidone (HYGROTON) 25 MG tablet, Take 1 tablet (25 mg total) by mouth daily., Disp: 30 tablet, Rfl: 2   Cholecalciferol (VITAMIN D3) 125 MCG (5000 UT) CAPS, Take 1 capsule (5,000 Units total) by mouth daily., Disp: 90 capsule, Rfl: 0   clonazePAM (KLONOPIN) 0.5 MG tablet, Take 0.5 mg by mouth 3 (three) times daily as needed., Disp: , Rfl:    gabapentin (NEURONTIN) 100 MG capsule, Take 1 capsule by mouth at bedtime, Disp: 30 capsule, Rfl: 0   guaiFENesin (ROBITUSSIN) 100 MG/5ML liquid, Take 5 mLs by mouth every 4 (four) hours as needed for cough or  to loosen phlegm., Disp: 120 mL, Rfl: 0   hydrOXYzine (VISTARIL) 25 MG capsule, TAKE 1 CAPSULE BY MOUTH EVERY 8 HOURS AS NEEDED, Disp: 30 capsule, Rfl: 0   metFORMIN (GLUCOPHAGE) 1000 MG tablet, Take 1 tablet (1,000 mg total) by mouth 2 (two) times daily with a meal., Disp: 180 tablet, Rfl: 3   methylPREDNISolone (MEDROL DOSEPAK) 4 MG TBPK tablet, Take as the package instructed, Disp: 1 each, Rfl: 0   naproxen (NAPROSYN) 500 MG tablet, TAKE 1 TABLET BY MOUTH TWICE DAILY WITH A MEAL, Disp: 30 tablet, Rfl: 0   pantoprazole (PROTONIX) 40 MG tablet, Take 1 tablet (40 mg total) by mouth as needed., Disp: 30 tablet, Rfl: 0   promethazine-dextromethorphan (PROMETHAZINE-DM) 6.25-15 MG/5ML syrup, Take 5 mLs by mouth 4 (four) times daily as needed., Disp: 118 mL, Rfl: 0   valACYclovir (VALTREX) 500 MG tablet, Take 500 mg by mouth 1 day or 1 dose., Disp: , Rfl:   Observations/Objective: There were no vitals filed for this visit. Physical Exam No labored breathing.  Speech is clear and coherent with logical content.  Patient is alert and oriented at baseline.   Assessment and Plan: Viral illness -     Promethazine-DM; Take 5 mLs by mouth 4 (four) times daily as needed.  Dispense: 118 mL; Refill: 0  Take medication as prescribed. Will test patient for COVID Increase fluids and allow for plenty of rest. Recommend Tylenol or ibuprofen as needed for pain, fever, or general discomfort.  Warm salt water gargles 3-4 times daily to help with throat pain or discomfort. Recommend using a humidifier at bedtime during sleep to help with cough and nasal congestion.    Follow Up Instructions: No follow-ups on file.  Follow-up if your symptoms do not improve  I discussed the assessment and treatment plan with the patient. The patient was provided an opportunity to ask questions, and all were answered. The patient agreed with the plan and demonstrated an understanding of the instructions.   The patient was  advised to call back or seek an in-person evaluation if the symptoms worsen or if the condition fails to improve as anticipated.  The above assessment and management plan was discussed with the patient. The patient verbalized understanding of and has agreed to the management plan.   Alvira Monday, FNP

## 2022-06-30 ENCOUNTER — Encounter: Payer: Self-pay | Admitting: Emergency Medicine

## 2022-06-30 ENCOUNTER — Other Ambulatory Visit: Payer: Self-pay

## 2022-06-30 ENCOUNTER — Ambulatory Visit
Admission: EM | Admit: 2022-06-30 | Discharge: 2022-06-30 | Disposition: A | Discharge status: Home / Self Care | Payer: 59 | Attending: Family Medicine | Admitting: Family Medicine

## 2022-06-30 DIAGNOSIS — J0100 Acute maxillary sinusitis, unspecified: Secondary | ICD-10-CM | POA: Diagnosis not present

## 2022-06-30 DIAGNOSIS — R030 Elevated blood-pressure reading, without diagnosis of hypertension: Secondary | ICD-10-CM | POA: Diagnosis not present

## 2022-06-30 DIAGNOSIS — H538 Other visual disturbances: Secondary | ICD-10-CM

## 2022-06-30 MED ORDER — BUDESONIDE 32 MCG/ACT NA SUSP: 1.0000 | Freq: Two times a day (BID) | NASAL | 1 refills | Status: DC

## 2022-06-30 MED ORDER — AMOXICILLIN-POT CLAVULANATE 875-125 MG PO TABS: 1.0000 | ORAL_TABLET | Freq: Two times a day (BID) | ORAL | 0 refills | Status: DC

## 2022-06-30 NOTE — Discharge Instructions (Signed)
We are treating you for a sinus infection today.  Your exam was very reassuring overall, however as we discussed I am still concerned about your vision change and previous symptoms though they are improving at this time.  I do recommend that you go to the emergency department for further evaluation particularly if you are getting worse or your symptoms or not improving dramatically over the next 24 hours.

## 2022-06-30 NOTE — ED Provider Notes (Signed)
[facility]    CSN: [id number] Arrival date & time: 06/30/22  1136      History   Chief Complaint Chief Complaint  Patient presents with   Nasal Congestion    HPI Norma Horton is a 52 y.o. female.   Patient presenting today with about a week of progressively worsening nasal congestion, cough, facial pain and pressure and now since awaking this morning has had a headache, slightly blurred vision and a very small period of right-sided mouth numbness that is now fully resolved.  States her vision is also resolving but still slightly different from her baseline.  Denies extremity numbness weakness or tingling, speech or swallowing changes, mental status changes, balance or gait changes.  She also denies any chest pain, shortness of breath, palpitations, diaphoresis, fever, chills, body aches.  She notes that her blood pressure has been elevated but this has been an ongoing issue and has been undergoing multiple medication changes with PCP to help get under control.  Past medical history significant for anxiety, depression, diabetes, hypertension, bilateral lower extremity edema, morbid obesity.  Not tried anything over-the-counter for symptoms.    Past Medical History:  Diagnosis Date   Anxiety    Depression    Pt has had suicide ideations in the past.   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    Patient Active Problem List   Diagnosis Date Noted   Bilateral lower extremity edema 06/21/2022   Moderate asthma with exacerbation 04/22/2022   Vitamin D deficiency 02/08/2022   Encounter for screening fecal occult blood testing 02/03/2022   Urinary frequency 02/03/2022   Encounter for gynecological examination with Papanicolaou smear of cervix 02/03/2022   Anxiety and depression 02/03/2022   Lumbar back pain with radiculopathy affecting right lower extremity 01/21/2022   Sleep disturbance 01/21/2022   Encounter for  general adult medical examination with abnormal findings 01/21/2022   Goiter 01/05/2022   Thyroid nodule greater than or equal to 1 cm in diameter incidentally noted on imaging study 11/16/2021   Moderate persistent asthma without complication [id number]   Gastroesophageal reflux disease 11/16/2021   Essential hypertension, benign 11/16/2021   Type 2 diabetes mellitus without complication, without long-term current use of insulin ([place]) 11/16/2021   Morbid obesity ([place]) 11/16/2021   Depression     Past Surgical History:  Procedure Laterality Date   CHOLECYSTECTOMY     TUBAL LIGATION      OB History     Gravida  2   Para  2   Term      Preterm      AB      Living  2      SAB      IAB      Ectopic      Multiple      Live Births               Home Medications    Prior to Admission medications   Medication Sig Start Date End Date Taking? Authorizing Provider  amoxicillin-clavulanate (AUGMENTIN) 875-125 MG tablet Take 1 tablet by mouth every 12 (twelve) hours. 06/30/22  Yes Volney American, PA-C  budesonide (RHINOCORT ALLERGY) 32 MCG/ACT nasal spray Place 1 spray into both nostrils 2 (two) times daily. 06/30/22  Yes Volney American, PA-C  albuterol (VENTOLIN HFA) 108 (90 Base) MCG/ACT inhaler INHALE 2 PUFFS BY MOUTH EVERY 6  HOURS AS NEEDED FOR WHEEZING AND FOR SHORTNESS OF BREATH 02/07/22   Alvira Monday, FNP  BREYNA 80-4.5 MCG/ACT inhaler Inhale 2 puffs by mouth twice daily 06/27/22   Alvira Monday, FNP  chlorthalidone (HYGROTON) 25 MG tablet Take 1 tablet (25 mg total) by mouth daily. 06/21/22 09/19/22  Johnette Abraham, MD  Cholecalciferol (VITAMIN D3) 125 MCG (5000 UT) CAPS Take 1 capsule (5,000 Units total) by mouth daily. Patient not taking: Reported on 06/30/2022 04/26/22   Alvira Monday, FNP  clonazePAM (KLONOPIN) 0.5 MG tablet Take 0.5 mg by mouth 3 (three) times daily as needed.    [provider]  gabapentin (NEURONTIN) 100 MG  capsule Take 1 capsule by mouth at bedtime 05/09/22   Alvira Monday, FNP  guaiFENesin (ROBITUSSIN) 100 MG/5ML liquid Take 5 mLs by mouth every 4 (four) hours as needed for cough or to loosen phlegm. 04/15/22   Alvira Monday, FNP  hydrOXYzine (VISTARIL) 25 MG capsule TAKE 1 CAPSULE BY MOUTH EVERY 8 HOURS AS NEEDED 04/27/22   Alvira Monday, FNP  metFORMIN (GLUCOPHAGE) 1000 MG tablet Take 1 tablet (1,000 mg total) by mouth 2 (two) times daily with a meal. 04/26/22   Alvira Monday, FNP  methylPREDNISolone (MEDROL DOSEPAK) 4 MG TBPK tablet Take as the package instructed 04/22/22   Alvira Monday, FNP  naproxen (NAPROSYN) 500 MG tablet TAKE 1 TABLET BY MOUTH TWICE DAILY WITH A MEAL 02/07/22   Alvira Monday, FNP  pantoprazole (PROTONIX) 40 MG tablet Take 1 tablet (40 mg total) by mouth as needed. 04/22/22   Alvira Monday, FNP  promethazine-dextromethorphan (PROMETHAZINE-DM) 6.25-15 MG/5ML syrup Take 5 mLs by mouth 4 (four) times daily as needed. 06/28/22   Alvira Monday, FNP  valACYclovir (VALTREX) 500 MG tablet Take 500 mg by mouth 1 day or 1 dose.    [provider]    Family History Family History  Problem Relation Age of Onset   Breast cancer Maternal Grandmother    Heart attack Father    Hypertension Father    COPD Mother    Diabetes Mellitus II Brother    Throat cancer Brother    Cerebral aneurysm Sister    Cervical cancer Sister    Cervical cancer Sister    Heart attack Sister    Diabetes Daughter    Asthma Daughter    Cystic fibrosis Daughter    Hypertension Other     Social History Social History   Tobacco Use   Smoking status: Never   Smokeless tobacco: Never  Vaping Use   Vaping Use: Never used  Substance Use Topics   Alcohol use: Yes    Comment: occ   Drug use: No    Allergies   Fenoprofen calcium  Review of Systems Review of Systems PER HPI  Physical Exam Triage Vital Signs ED Triage Vitals [06/30/22 1359]  Enc Vitals Group     BP (!)  161/96     Pulse Rate 70     Resp 20     Temp (!) 97.5 F (36.4 C)     Temp Source Oral     SpO2 96 %     Weight      Height      Head Circumference      Peak Flow      Pain Score 6     Pain Loc      Pain Edu?      Excl. in [place]?    No data found.  Updated Vital Signs BP Marland Kitchen)  161/96 (BP Location: Right Arm)   Pulse 70   Temp (!) 97.5 F (36.4 C) (Oral)   Resp 20   LMP 08/30/2016   SpO2 96%   Visual Acuity Right Eye Distance: 20/80 Left Eye Distance: 20/50 Bilateral Distance: 20/50 (pt reports wears glasses for reading.)  Right Eye Near:   Left Eye Near:    Bilateral Near:     Physical Exam Vitals and nursing note reviewed.  Constitutional:      Appearance: Normal appearance.  HENT:     Head: Atraumatic.     Right Ear: Tympanic membrane and external ear normal.     Left Ear: Tympanic membrane and external ear normal.     Nose: Congestion present.     Mouth/Throat:     Mouth: Mucous membranes are moist.     Pharynx: Posterior oropharyngeal erythema present.  Eyes:     Extraocular Movements: Extraocular movements intact.     Conjunctiva/sclera: Conjunctivae normal.     Pupils: Pupils are equal, round, and reactive to light.  Cardiovascular:     Rate and Rhythm: Normal rate and regular rhythm.     Heart sounds: Normal heart sounds.  Pulmonary:     Effort: Pulmonary effort is normal.     Breath sounds: Normal breath sounds. No wheezing or rales.  Musculoskeletal:        General: Normal range of motion.     Cervical back: Normal range of motion and neck supple.  Skin:    General: Skin is warm and dry.  Neurological:     General: No focal deficit present.     Mental Status: She is alert and oriented to person, place, and time.     Cranial Nerves: No cranial nerve deficit.     Motor: No weakness.     Gait: Gait normal.  Psychiatric:        Mood and Affect: Mood normal.        Thought Content: Thought content normal.    UC Treatments / Results   Labs (all labs ordered are listed, but only abnormal results are displayed) Labs Reviewed - No data to display  EKG  Radiology No results found.  Procedures Procedures (including critical care time)  Medications Ordered in UC Medications - No data to display  Initial Impression / Assessment and Plan / UC Course  I have reviewed the triage vital signs and the nursing notes.  Pertinent labs & imaging results that were available during my care of the patient were reviewed by me and considered in my medical decision making (see chart for details).     Patient is mildly hypertensive in triage today, otherwise vital signs overall reassuring.  Exam reassuring, consistent with sinusitis and no focal neurologic deficits noted today.  She does state her symptoms are resolving regarding her vision and that the mouth numbness was very temporary and resolved.  Discussed with her that we are unable to fully rule out a neurologic event today and that she need to go the emergency department for this, she declines this at this time and wishes to continue to monitor.  Will treat for sinusitis with antibiotics, serial nasal sprays, supportive over-the-counter medications and home care and discussed ED for worsening symptoms.  Follow-up with PCP next week for recheck.  Final Clinical Impressions(s) / UC Diagnoses   Final diagnoses:  Acute maxillary sinusitis, recurrence not specified  Elevated blood pressure reading  Blurred vision     Discharge Instructions  We are treating you for a sinus infection today.  Your exam was very reassuring overall, however as we discussed I am still concerned about your vision change and previous symptoms though they are improving at this time.  I do recommend that you go to the emergency department for further evaluation particularly if you are getting worse or your symptoms or not improving dramatically over the next 24 hours.     ED Prescriptions      Medication Sig Dispense Auth. Provider   amoxicillin-clavulanate (AUGMENTIN) 875-125 MG tablet Take 1 tablet by mouth every 12 (twelve) hours. 14 tablet Volney American, PA-C   budesonide [facility] ALLERGY) 32 MCG/ACT nasal spray Place 1 spray into both nostrils 2 (two) times daily. 16 mL Volney American, [place]      PDMP not reviewed this encounter.   Volney American, [place] 06/30/22 1521

## 2022-06-30 NOTE — ED Triage Notes (Signed)
Pt reports woke up this morning with blurred vision, intermittent right sided mouth numbness. Pt also reports nasal congestion, runny nose, cough since Saturday.   Pt went to bed last night at approximately 1130 last night and felt fine. Pt alert, oriented x4. Steady gait. Equal grips. Facial symmetry.denies numbness/tingling in extremity.  Pt also reports blood pressure has been elevated for "awhile" and reports providers have been trying various medication adjustments.

## 2022-07-01 ENCOUNTER — Ambulatory Visit: Payer: 59 | Admitting: Family Medicine

## 2022-07-24 ENCOUNTER — Other Ambulatory Visit: Payer: Self-pay | Admitting: Family Medicine

## 2022-07-24 DIAGNOSIS — J4540 Moderate persistent asthma, uncomplicated: Secondary | ICD-10-CM

## 2022-07-25 ENCOUNTER — Encounter: Payer: Self-pay | Admitting: Family Medicine

## 2022-07-25 ENCOUNTER — Ambulatory Visit (INDEPENDENT_AMBULATORY_CARE_PROVIDER_SITE_OTHER): Payer: 59 | Admitting: Family Medicine

## 2022-07-25 VITALS — BP 138/88 | HR 75 | Ht 63.0 in | Wt 352.1 lb

## 2022-07-25 DIAGNOSIS — E119 Type 2 diabetes mellitus without complications: Secondary | ICD-10-CM

## 2022-07-25 DIAGNOSIS — R600 Localized edema: Secondary | ICD-10-CM

## 2022-07-25 DIAGNOSIS — I10 Essential (primary) hypertension: Secondary | ICD-10-CM

## 2022-07-25 DIAGNOSIS — E038 Other specified hypothyroidism: Secondary | ICD-10-CM

## 2022-07-25 DIAGNOSIS — E7849 Other hyperlipidemia: Secondary | ICD-10-CM

## 2022-07-25 DIAGNOSIS — E559 Vitamin D deficiency, unspecified: Secondary | ICD-10-CM | POA: Diagnosis not present

## 2022-07-25 DIAGNOSIS — M792 Neuralgia and neuritis, unspecified: Secondary | ICD-10-CM

## 2022-07-25 DIAGNOSIS — F3289 Other specified depressive episodes: Secondary | ICD-10-CM | POA: Diagnosis not present

## 2022-07-25 DIAGNOSIS — K219 Gastro-esophageal reflux disease without esophagitis: Secondary | ICD-10-CM

## 2022-07-25 MED ORDER — CHLORTHALIDONE 25 MG PO TABS: 25.0000 mg | ORAL_TABLET | Freq: Every day | ORAL | 2 refills | Status: DC

## 2022-07-25 MED ORDER — PANTOPRAZOLE SODIUM 40 MG PO TBEC: 40.0000 mg | DELAYED_RELEASE_TABLET | ORAL | 2 refills | Status: DC | PRN

## 2022-07-25 MED ORDER — GABAPENTIN 100 MG PO CAPS: 100.0000 mg | ORAL_CAPSULE | Freq: Every day | ORAL | 0 refills | Status: DC

## 2022-07-25 MED ORDER — METFORMIN HCL 1000 MG PO TABS: 1000.0000 mg | ORAL_TABLET | Freq: Two times a day (BID) | ORAL | 3 refills | Status: DC

## 2022-07-25 NOTE — Assessment & Plan Note (Signed)
Denies polyuria, polyphagia, polydipsia Refilled metformin at 1000 mg twice daily Lab Results  Component Value Date   HGBA1C 7.0 (H) 04/22/2022

## 2022-07-25 NOTE — Progress Notes (Signed)
Established Patient Office Visit  Subjective:  Patient ID: Norma Horton, female    DOB: [birth year]  Age: 52 y.o. MRN: [id number]  CC:  Chief Complaint  Patient presents with   Follow-up    3 month f/u, pt reports leg pain x 2days ( 07/23/2022).     HPI Norma Horton is a 52 y.o. female with past medical history of type 2 diabetes, GERD, bowel and hypertension presents for f/u of  chronic medical conditions. For the details of today's visit, please refer to the assessment and plan.     Past Medical History:  Diagnosis Date   Anxiety    Depression    Pt has had suicide ideations in the past.   Diabetes mellitus, type II (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    Past Surgical History:  Procedure Laterality Date   CHOLECYSTECTOMY     TUBAL LIGATION      Family History  Problem Relation Age of Onset   Breast cancer Maternal Grandmother    Heart attack Father    Hypertension Father    COPD Mother    Diabetes Mellitus II Brother    Throat cancer Brother    Cerebral aneurysm Sister    Cervical cancer Sister    Cervical cancer Sister    Heart attack Sister    Diabetes Daughter    Asthma Daughter    Cystic fibrosis Daughter    Hypertension Other     Social History   Socioeconomic History   Marital status: Married    Spouse name: Not on file   Number of children: 4   Years of education: Not on file   Highest education level: Not on file  Occupational History   Not on file  Tobacco Use   Smoking status: Never   Smokeless tobacco: Never  Vaping Use   Vaping Use: Never used  Substance and Sexual Activity   Alcohol use: Yes    Comment: occ   Drug use: No   Sexual activity: Yes    Birth control/protection: Surgical, Post-menopausal    Comment: tubal  Other Topics Concern   Not on file  Social History Narrative   Lives with her husband and her children    Social Determinants of Health   Financial Resource Strain: Medium  Risk (02/03/2022)   Overall Financial Resource Strain (CARDIA)    Difficulty of Paying Living Expenses: Somewhat hard  Food Insecurity: No Food Insecurity (02/03/2022)   Hunger Vital Sign    Worried About Running Out of Food in the Last Year: Never true    Ran Out of Food in the Last Year: Never true  Transportation Needs: No Transportation Needs (02/03/2022)   PRAPARE - Hydrologist (Medical): No    Lack of Transportation (Non-Medical): No  Physical Activity: Inactive (02/03/2022)   Exercise Vital Sign    Days of Exercise per Week: 0 days    Minutes of Exercise per Session: 0 min  Stress: Stress Concern Present (02/03/2022)   [place]    Feeling of Stress : Very much  Social Connections: Moderately Isolated (02/03/2022)   Social Connection and Isolation Panel [NHANES]    Frequency of Communication with Friends and Family: More than three times a week    Frequency of Social Gatherings with Friends and Family: Twice a week  Attends Religious Services: Never    Active Member of Clubs or Organizations: No    Attends Archivist Meetings: Never    Marital Status: Married  Human resources officer Violence: Not At Risk (02/03/2022)   Humiliation, Afraid, Rape, and Kick questionnaire    Fear of Current or Ex-Partner: No    Emotionally Abused: No    Physically Abused: No    Sexually Abused: No    Outpatient Medications Prior to Visit  Medication Sig Dispense Refill   albuterol (VENTOLIN HFA) 108 (90 Base) MCG/ACT inhaler INHALE 2 PUFFS BY MOUTH EVERY 6 HOURS AS NEEDED FOR WHEEZING AND FOR SHORTNESS OF BREATH 7 each 0   amoxicillin-clavulanate (AUGMENTIN) 875-125 MG tablet Take 1 tablet by mouth every 12 (twelve) hours. 14 tablet 0   budesonide (RHINOCORT ALLERGY) 32 MCG/ACT nasal spray Place 1 spray into both nostrils 2 (two) times daily. 16 mL 1   budesonide-formoterol (SYMBICORT) 80-4.5  MCG/ACT inhaler INHALE 2 PUFFS TWICE DAILY 11 g 0   Cholecalciferol (VITAMIN D3) 125 MCG (5000 UT) CAPS Take 1 capsule (5,000 Units total) by mouth daily. 90 capsule 0   clonazePAM (KLONOPIN) 0.5 MG tablet Take 0.5 mg by mouth 3 (three) times daily as needed.     guaiFENesin (ROBITUSSIN) 100 MG/5ML liquid Take 5 mLs by mouth every 4 (four) hours as needed for cough or to loosen phlegm. 120 mL 0   hydrOXYzine (VISTARIL) 25 MG capsule TAKE 1 CAPSULE BY MOUTH EVERY 8 HOURS AS NEEDED 30 capsule 0   methylPREDNISolone (MEDROL DOSEPAK) 4 MG TBPK tablet Take as the package instructed 1 each 0   naproxen (NAPROSYN) 500 MG tablet TAKE 1 TABLET BY MOUTH TWICE DAILY WITH A MEAL 30 tablet 0   promethazine-dextromethorphan (PROMETHAZINE-DM) 6.25-15 MG/5ML syrup Take 5 mLs by mouth 4 (four) times daily as needed. 118 mL 0   valACYclovir (VALTREX) 500 MG tablet Take 500 mg by mouth 1 day or 1 dose.     chlorthalidone (HYGROTON) 25 MG tablet Take 1 tablet (25 mg total) by mouth daily. 30 tablet 2   gabapentin (NEURONTIN) 100 MG capsule Take 1 capsule by mouth at bedtime 30 capsule 0   metFORMIN (GLUCOPHAGE) 1000 MG tablet Take 1 tablet (1,000 mg total) by mouth 2 (two) times daily with a meal. 180 tablet 3   pantoprazole (PROTONIX) 40 MG tablet Take 1 tablet (40 mg total) by mouth as needed. 30 tablet 0   No facility-administered medications prior to visit.    Allergies  Allergen Reactions   Fenoprofen Calcium     ROS Review of Systems  Constitutional:  Negative for chills and fever.  Eyes:  Negative for visual disturbance.  Respiratory:  Negative for chest tightness and shortness of breath.   Musculoskeletal:        Right leg cramps  Neurological:  Negative for dizziness and headaches.  Psychiatric/Behavioral:  Negative for suicidal ideas.       Objective:    Physical Exam HENT:     Head: Normocephalic.     Mouth/Throat:     Mouth: Mucous membranes are moist.  Cardiovascular:     Rate and  Rhythm: Normal rate.     Heart sounds: Normal heart sounds.  Pulmonary:     Effort: Pulmonary effort is normal.     Breath sounds: Normal breath sounds.  Musculoskeletal:        General: No deformity or signs of injury.     Right lower leg: No edema.  Left lower leg: No edema.     Comments: +2 popliteal and dorsalis pedis pulses  Neurological:     Mental Status: She is alert.     BP 138/88 (BP Location: Left Arm)   Pulse 75   Ht 5\' 3"  (1.6 m)   Wt (!) 352 lb 1.9 oz (159.7 kg)   LMP 08/30/2016   SpO2 97%   BMI 62.38 kg/m  Wt Readings from Last 3 Encounters:  07/25/22 (!) 352 lb 1.9 oz (159.7 kg)  06/21/22 (!) 361 lb 12.8 oz (164.1 kg)  04/22/22 (!) 356 lb (161.5 kg)    Lab Results  Component Value Date   TSH 2.750 04/22/2022   Lab Results  Component Value Date   WBC 8.0 04/22/2022   HGB 15.4 04/22/2022   HCT 47.1 (H) 04/22/2022   MCV 91 04/22/2022   PLT 261 04/22/2022   Lab Results  Component Value Date   NA 139 06/21/2022   K 3.9 06/21/2022   CO2 25 06/21/2022   GLUCOSE 107 (H) 06/21/2022   BUN 12 06/21/2022   CREATININE 0.60 06/21/2022   BILITOT 0.7 04/22/2022   ALKPHOS 96 04/22/2022   AST 23 04/22/2022   ALT 28 04/22/2022   PROT 7.2 04/22/2022   ALBUMIN 4.1 04/22/2022   CALCIUM 9.8 06/21/2022   EGFR 107 06/21/2022   Lab Results  Component Value Date   CHOL 171 04/22/2022   Lab Results  Component Value Date   HDL 54 04/22/2022   Lab Results  Component Value Date   LDLCALC 97 04/22/2022   Lab Results  Component Value Date   TRIG 113 04/22/2022   Lab Results  Component Value Date   CHOLHDL 3.2 04/22/2022   Lab Results  Component Value Date   HGBA1C 7.0 (H) 04/22/2022      Assessment & Plan:  Essential hypertension, benign Assessment & Plan: Controlled She takes chlorthalidone 25 mg daily She reports compliance with treatment regimen and complains of cramps in her right lower extremities Onset of symptoms 4 days ago No  redness and warmth with palpation No evidence of unilateral swelling Low suspicion for DVT Her cramps are likely due to electrolyte imbalance Will assess her CMP today Encouraged to take vitamin B complex over-the-counter and to increase her fluid consumption to at least 64 ounces daily    Type 2 diabetes mellitus without complication, without long-term current use of insulin (HCC) Assessment & Plan: Denies polyuria, polyphagia, polydipsia Refilled metformin at 1000 mg twice daily Lab Results  Component Value Date   HGBA1C 7.0 (H) 04/22/2022     Orders: -     Hemoglobin A1c -     metFORMIN HCl; Take 1 tablet (1,000 mg total) by mouth 2 (two) times daily with a meal.  Dispense: 180 tablet; Refill: 3  Other depression Assessment & Plan: No reports of suicidal thoughts and ideation She reports not speaking with her therapist, however she using coping mechanism PHQ 9 is 15 today   Vitamin D deficiency -     VITAMIN D 25 Hydroxy (Vit-D Deficiency, Fractures)  Other specified hypothyroidism -     TSH + free T4  Other hyperlipidemia -     CBC with Differential/Platelet -     CMP14+EGFR -     Lipid panel  Bilateral lower extremity edema -     Chlorthalidone; Take 1 tablet (25 mg total) by mouth daily.  Dispense: 90 tablet; Refill: 2  Neuropathic pain of right lower extremity -  Gabapentin; Take 1 capsule (100 mg total) by mouth at bedtime.  Dispense: 30 capsule; Refill: 0  Gastroesophageal reflux disease, unspecified whether esophagitis present -     Pantoprazole Sodium; Take 1 tablet (40 mg total) by mouth as needed.  Dispense: 30 tablet; Refill: 2    Follow-up: Return in about 3 months (around 10/25/2022).   Alvira Monday, FNP

## 2022-07-25 NOTE — Patient Instructions (Addendum)
  I appreciate the opportunity to provide care to you today!    Follow up:  3 months  Labs: please stop by the lab today to get your blood drawn (CBC, CMP, TSH, Lipid profile, HgA1c, Vit D)   Leg cramps vitamin B complex (three times daily, containing 30 mg of vitamin B6)   Please schedule your annual eye exam    Please continue to a heart-healthy diet and increase your physical activities. Try to exercise for 61mins at least five days a week.      It was a pleasure to see you and I look forward to continuing to work together on your health and well-being. Please do not hesitate to call the office if you need care or have questions about your care.   Have a wonderful day and week. With Gratitude, Alvira Monday MSN, FNP-BC

## 2022-07-25 NOTE — Progress Notes (Deleted)
   Established Patient Office Visit  Subjective   Patient ID: Norma Horton, female    DOB: 05-02-1971  Age: 52 y.o. MRN: [id number]  Chief Complaint  Patient presents with   Follow-up    3 month f/u, pt reports leg pain x 2days ( 07/23/2022).     Patient reports bilaterally upper leg pain, describes as achy, deep pain. Does not radiate.  Reports increased bilateral leg and feet cramping.     {History (Optional):23778}  Review of Systems  Constitutional: Negative.   Respiratory: Negative.    Cardiovascular: Negative.   Gastrointestinal: Negative.   Genitourinary:  Positive for frequency.  Musculoskeletal:  Positive for falls and myalgias.  Neurological:  Positive for headaches.  Psychiatric/Behavioral:  Positive for depression.       Objective:     BP 138/88 (BP Location: Left Arm)   Pulse 75   Ht 5\' 3"  (1.6 m)   Wt (!) 352 lb 1.9 oz (159.7 kg)   LMP 08/30/2016   SpO2 97%   BMI 62.38 kg/m  {Vitals History (Optional):23777}  Physical Exam Constitutional:      Appearance: Normal appearance. She is normal weight.  Cardiovascular:     Rate and Rhythm: Normal rate and regular rhythm.  Pulmonary:     Effort: Pulmonary effort is normal.     Breath sounds: Normal breath sounds.  Neurological:     Mental Status: She is oriented to person, place, and time. Mental status is at baseline.      No results found for any visits on 07/25/22.  {Labs (Optional):23779}  The 10-year ASCVD risk score (Arnett DK, et al., 2019) is: 4.6%    Assessment & Plan:   Problem List Items Addressed This Visit       Cardiovascular and Mediastinum   Essential hypertension, benign - Primary     Endocrine   Type 2 diabetes mellitus without complication, without long-term current use of insulin (HCC)     Other   Vitamin D deficiency   Other Visit Diagnoses     Other specified hypothyroidism       Other hyperlipidemia           No follow-ups on file.    Brandt Loosen, RN

## 2022-07-25 NOTE — Assessment & Plan Note (Addendum)
Controlled She takes chlorthalidone 25 mg daily She reports compliance with treatment regimen and complains of cramps in her right lower extremities Onset of symptoms 4 days ago No redness and warmth with palpation No evidence of unilateral swelling Low suspicion for DVT Her cramps are likely due to electrolyte imbalance Will assess her CMP today Encouraged to take vitamin B complex over-the-counter and to increase her fluid consumption to at least 64 ounces daily

## 2022-07-25 NOTE — Assessment & Plan Note (Signed)
No reports of suicidal thoughts and ideation She reports not speaking with her therapist, however she using coping mechanism PHQ 9 is 15 today

## 2022-07-26 LAB — CBC WITH DIFFERENTIAL/PLATELET
Basophils Absolute: 0.1 10*3/uL (ref 0.0–0.2)
Basos: 1 %
EOS (ABSOLUTE): 0.2 10*3/uL (ref 0.0–0.4)
Eos: 2 %
Hematocrit: 45.7 % (ref 34.0–46.6)
Hemoglobin: 15 g/dL (ref 11.1–15.9)
Immature Grans (Abs): 0 10*3/uL (ref 0.0–0.1)
Immature Granulocytes: 0 %
Lymphocytes Absolute: 2.9 10*3/uL (ref 0.7–3.1)
Lymphs: 30 %
MCH: 29.6 pg (ref 26.6–33.0)
MCHC: 32.8 g/dL (ref 31.5–35.7)
MCV: 90 fL (ref 79–97)
Monocytes Absolute: 0.7 10*3/uL (ref 0.1–0.9)
Monocytes: 7 %
Neutrophils Absolute: 5.7 10*3/uL (ref 1.4–7.0)
Neutrophils: 60 %
Platelets: 307 10*3/uL (ref 150–450)
RBC: 5.06 x10E6/uL (ref 3.77–5.28)
RDW: 12.6 % (ref 11.7–15.4)
WBC: 9.6 10*3/uL (ref 3.4–10.8)

## 2022-07-26 LAB — HEMOGLOBIN A1C
Est. average glucose Bld gHb Est-mCnc: 163 mg/dL
Hgb A1c MFr Bld: 7.3 % — ABNORMAL HIGH (ref 4.8–5.6)

## 2022-07-26 LAB — CMP14+EGFR
ALT: 38 IU/L — ABNORMAL HIGH (ref 0–32)
AST: 23 IU/L (ref 0–40)
Albumin/Globulin Ratio: 1.3 (ref 1.2–2.2)
Albumin: 4.1 g/dL (ref 3.8–4.9)
Alkaline Phosphatase: 95 IU/L (ref 44–121)
BUN/Creatinine Ratio: 17 (ref 9–23)
BUN: 12 mg/dL (ref 6–24)
Bilirubin Total: 0.7 mg/dL (ref 0.0–1.2)
CO2: 25 mmol/L (ref 20–29)
Calcium: 9.8 mg/dL (ref 8.7–10.2)
Chloride: 98 mmol/L (ref 96–106)
Creatinine, Ser: 0.72 mg/dL (ref 0.57–1.00)
Globulin, Total: 3.1 g/dL (ref 1.5–4.5)
Glucose: 141 mg/dL — ABNORMAL HIGH (ref 70–99)
Potassium: 3.9 mmol/L (ref 3.5–5.2)
Sodium: 139 mmol/L (ref 134–144)
Total Protein: 7.2 g/dL (ref 6.0–8.5)
eGFR: 99 mL/min/{1.73_m2} (ref >59)

## 2022-07-26 LAB — LIPID PANEL
Chol/HDL Ratio: 2.9 ratio (ref 0.0–4.4)
Cholesterol, Total: 167 mg/dL (ref 100–199)
HDL: 58 mg/dL (ref >39)
LDL Chol Calc (NIH): 89 mg/dL (ref 0–99)
Triglycerides: 114 mg/dL (ref 0–149)
VLDL Cholesterol Cal: 20 mg/dL (ref 5–40)

## 2022-07-26 LAB — VITAMIN D 25 HYDROXY (VIT D DEFICIENCY, FRACTURES): Vit D, 25-Hydroxy: 11.8 ng/mL — ABNORMAL LOW (ref 30.0–100.0)

## 2022-07-26 LAB — TSH+FREE T4
Free T4: 1.28 ng/dL (ref 0.82–1.77)
TSH: 2.3 u[IU]/mL (ref 0.450–4.500)

## 2022-08-01 ENCOUNTER — Other Ambulatory Visit: Payer: Self-pay | Admitting: Family Medicine

## 2022-08-01 DIAGNOSIS — E119 Type 2 diabetes mellitus without complications: Secondary | ICD-10-CM

## 2022-08-01 DIAGNOSIS — E559 Vitamin D deficiency, unspecified: Secondary | ICD-10-CM

## 2022-08-01 MED ORDER — GLIMEPIRIDE 2 MG PO TABS: 2.0000 mg | ORAL_TABLET | Freq: Every day | ORAL | 3 refills | Status: DC

## 2022-08-01 MED ORDER — VITAMIN D (ERGOCALCIFEROL) 1.25 MG (50000 UNIT) PO CAPS: 50000.0000 [IU] | ORAL_CAPSULE | ORAL | 1 refills | Status: DC

## 2022-08-09 ENCOUNTER — Ambulatory Visit: Payer: Self-pay | Admitting: "Endocrinology

## 2022-08-10 ENCOUNTER — Ambulatory Visit: Payer: 59 | Admitting: Nurse Practitioner

## 2022-08-10 ENCOUNTER — Ambulatory Visit: Payer: 59 | Admitting: "Endocrinology

## 2022-08-11 LAB — COMPREHENSIVE METABOLIC PANEL
ALT: 39 IU/L — ABNORMAL HIGH (ref 0–32)
AST: 36 IU/L (ref 0–40)
Albumin/Globulin Ratio: 1.4 (ref 1.2–2.2)
Albumin: 4 g/dL (ref 3.8–4.9)
Alkaline Phosphatase: 90 IU/L (ref 44–121)
BUN/Creatinine Ratio: 18 (ref 9–23)
BUN: 14 mg/dL (ref 6–24)
Bilirubin Total: 0.6 mg/dL (ref 0.0–1.2)
CO2: 27 mmol/L (ref 20–29)
Calcium: 9.4 mg/dL (ref 8.7–10.2)
Chloride: 97 mmol/L (ref 96–106)
Creatinine, Ser: 0.78 mg/dL (ref 0.57–1.00)
Globulin, Total: 2.8 g/dL (ref 1.5–4.5)
Glucose: 136 mg/dL — ABNORMAL HIGH (ref 70–99)
Potassium: 3.9 mmol/L (ref 3.5–5.2)
Sodium: 140 mmol/L (ref 134–144)
Total Protein: 6.8 g/dL (ref 6.0–8.5)
eGFR: 90 mL/min/{1.73_m2} (ref >59)

## 2022-08-11 LAB — TSH: TSH: 3.64 u[IU]/mL (ref 0.450–4.500)

## 2022-08-11 LAB — VITAMIN D 25 HYDROXY (VIT D DEFICIENCY, FRACTURES): Vit D, 25-Hydroxy: 16.8 ng/mL — ABNORMAL LOW (ref 30.0–100.0)

## 2022-08-11 LAB — T4, FREE: Free T4: 1.22 ng/dL (ref 0.82–1.77)

## 2022-08-18 ENCOUNTER — Telehealth: Payer: Self-pay | Admitting: Family Medicine

## 2022-08-18 NOTE — Telephone Encounter (Signed)
Disability  Noted  Copied Sleeved    Providers box Copy in brown folder up front

## 2022-08-25 ENCOUNTER — Telehealth: Payer: Self-pay | Admitting: Family Medicine

## 2022-08-25 NOTE — Telephone Encounter (Signed)
[place] from [facility] called left voicemail said patient needs speak to nurse about her visit and detail information. Call back # [phone].

## 2022-08-26 NOTE — Telephone Encounter (Signed)
Unable to get through to anyone  

## 2022-08-29 ENCOUNTER — Other Ambulatory Visit: Payer: Self-pay | Admitting: Family Medicine

## 2022-08-29 ENCOUNTER — Telehealth: Payer: Self-pay | Admitting: Family Medicine

## 2022-08-29 NOTE — Telephone Encounter (Signed)
I called to get clearity on the disability forms needed to be completedd but the patient did not answer. I encouraged the patient to call me back at [phone]

## 2022-08-31 ENCOUNTER — Encounter: Payer: Self-pay | Admitting: "Endocrinology

## 2022-08-31 ENCOUNTER — Ambulatory Visit: Payer: 59 | Admitting: "Endocrinology

## 2022-08-31 VITALS — BP 118/74 | HR 68 | Ht 63.0 in | Wt 353.6 lb

## 2022-08-31 DIAGNOSIS — E119 Type 2 diabetes mellitus without complications: Secondary | ICD-10-CM

## 2022-08-31 DIAGNOSIS — E559 Vitamin D deficiency, unspecified: Secondary | ICD-10-CM

## 2022-08-31 DIAGNOSIS — I10 Essential (primary) hypertension: Secondary | ICD-10-CM

## 2022-08-31 DIAGNOSIS — E049 Nontoxic goiter, unspecified: Secondary | ICD-10-CM

## 2022-08-31 NOTE — Patient Instructions (Signed)

## 2022-08-31 NOTE — Progress Notes (Unsigned)
08/31/2022, 4:18 PM   Endocrinology follow-up note  Subjective:    Patient ID: Norma Horton, female    DOB: July 01, 1970, PCP Gilmore Laroche, FNP   Past Medical History:  Diagnosis Date   Anxiety    Depression    Pt has had suicide ideations in the past.   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   Past Surgical History:  Procedure Laterality Date   CHOLECYSTECTOMY     TUBAL LIGATION     Social History   Socioeconomic History   Marital status: Married    Spouse name: Not on file   Number of children: 4   Years of education: Not on file   Highest education level: Not on file  Occupational History   Not on file  Tobacco Use   Smoking status: Never   Smokeless tobacco: Never  Vaping Use   Vaping Use: Never used  Substance and Sexual Activity   Alcohol use: Yes    Comment: occ   Drug use: No   Sexual activity: Yes    Birth control/protection: Surgical, Post-menopausal    Comment: tubal  Other Topics Concern   Not on file  Social History Narrative   Lives with her husband and her children    Social Determinants of Health   Financial Resource Strain: Medium Risk (02/03/2022)   Overall Financial Resource Strain (CARDIA)    Difficulty of Paying Living Expenses: Somewhat hard  Food Insecurity: No Food Insecurity (02/03/2022)   Hunger Vital Sign    Worried About Running Out of Food in the Last Year: Never true    Ran Out of Food in the Last Year: Never true  Transportation Needs: No Transportation Needs (02/03/2022)   PRAPARE - Administrator, Civil Service (Medical): No    Lack of Transportation (Non-Medical): No  Physical Activity: Inactive (02/03/2022)   Exercise Vital Sign    Days of Exercise per Week: 0 days    Minutes of Exercise per Session: 0 min  Stress: Stress Concern Present (02/03/2022)   [name] of Occupational Health -  Occupational Stress Questionnaire    Feeling of Stress : Very much  Social Connections: Moderately Isolated (02/03/2022)   Social Connection and Isolation Panel [NHANES]    Frequency of Communication with Friends and Family: More than three times a week    Frequency of Social Gatherings with Friends and Family: Twice a week    Attends Religious Services: Never    Database administrator or Organizations: No    Attends Engineer, structural: Never    Marital Status: Married   Family History  Problem Relation Age of Onset   Breast cancer Maternal Grandmother    Heart attack Father    Hypertension Father    COPD Mother    Diabetes Mellitus II Brother    Throat cancer Brother    Cerebral aneurysm Sister    Cervical cancer Sister    Cervical cancer Sister    Heart attack Sister    Diabetes Daughter    Asthma Daughter    Cystic fibrosis Daughter  Hypertension Other    Outpatient Encounter Medications as of 08/31/2022  Medication Sig   albuterol (VENTOLIN HFA) 108 (90 Base) MCG/ACT inhaler INHALE 2 PUFFS BY MOUTH EVERY 6 HOURS AS NEEDED FOR WHEEZING AND FOR SHORTNESS OF BREATH   amoxicillin-clavulanate (AUGMENTIN) 875-125 MG tablet Take 1 tablet by mouth every 12 (twelve) hours.   budesonide (RHINOCORT ALLERGY) 32 MCG/ACT nasal spray Place 1 spray into both nostrils 2 (two) times daily.   budesonide-formoterol (SYMBICORT) 80-4.5 MCG/ACT inhaler INHALE 2 PUFFS TWICE DAILY   chlorthalidone (HYGROTON) 25 MG tablet Take 1 tablet (25 mg total) by mouth daily.   clonazePAM (KLONOPIN) 0.5 MG tablet Take 0.5 mg by mouth 3 (three) times daily as needed.   gabapentin (NEURONTIN) 100 MG capsule Take 1 capsule (100 mg total) by mouth at bedtime.   glimepiride (AMARYL) 2 MG tablet Take 1 tablet (2 mg total) by mouth daily before breakfast.   guaiFENesin (ROBITUSSIN) 100 MG/5ML liquid Take 5 mLs by mouth every 4 (four) hours as needed for cough or to loosen phlegm.   hydrOXYzine (VISTARIL) 25  MG capsule TAKE 1 CAPSULE BY MOUTH EVERY 8 HOURS AS NEEDED   metFORMIN (GLUCOPHAGE) 1000 MG tablet Take 1 tablet (1,000 mg total) by mouth 2 (two) times daily with a meal.   methylPREDNISolone (MEDROL DOSEPAK) 4 MG TBPK tablet Take as the package instructed   naproxen (NAPROSYN) 500 MG tablet TAKE 1 TABLET BY MOUTH TWICE DAILY WITH A MEAL   pantoprazole (PROTONIX) 40 MG tablet Take 1 tablet (40 mg total) by mouth as needed.   promethazine-dextromethorphan (PROMETHAZINE-DM) 6.25-15 MG/5ML syrup Take 5 mLs by mouth 4 (four) times daily as needed.   valACYclovir (VALTREX) 500 MG tablet Take 500 mg by mouth 1 day or 1 dose.   Vitamin D, Ergocalciferol, (DRISDOL) 1.25 MG (50000 UNIT) CAPS capsule Take 1 capsule (50,000 Units total) by mouth every 7 (seven) days.   [DISCONTINUED] Cholecalciferol (VITAMIN D3) 125 MCG (5000 UT) CAPS Take 1 capsule (5,000 Units total) by mouth daily.   No facility-administered encounter medications on file as of 08/31/2022.   ALLERGIES: Allergies  Allergen Reactions   Fenoprofen Calcium     VACCINATION STATUS: Immunization History  Administered Date(s) Administered   Tdap 05/23/1998, 11/16/2021   Zoster Recombinat (Shingrix) 01/21/2022, 04/22/2022    HPI Norma Horton is 52 y.o. female who presents today with a medical history as above. she is being seen in follow-up after she was seen in consultation for multinodular goiter requested by Gilmore Laroche, FNP.  See notes from previous visit.  She has history of multinodular goiter dating back to 2017.  She was found to have a 4.9 cm nodule in the right lobe which was asymmetrically larger than the left lobe with no nodules.  Right lobe measures 6.8 cm, left lobe measured 4.9 cm.  Prior biopsy in 2017 with benign findings. Patient describes dysphagia for pills, not with food and drinks.  She denies shortness of breath nor voice change.  More recently,  she did not want to have biopsy .  Repeat thyroid ultrasound  was performed on January 21, 2022 showing   Isthmus: 0.3 cm ,previously 0.3 cm   Right lobe: 7.7 x 3.9 x 4.7 cm ,previously 6.8 x 3.8 x 4.5 cm,    Left lobe: 5.2 x 1.1 x 1.5 cm ,previously 4.9 x 1.5 x 1.4 cm  Nodule in the right lobe of her thyroid size: 6.4 cm; Other 2 dimensions: 4.8 x 3.8 cm,  previously, 4.9 x 3.7 x 4.5 cm  She reports progressive weight gain over the years.  She denies recent weight change, palpitations, heat/cold intolerance.  She has no family history of thyroid dysfunction or thyroid malignancy. Her other medical problems include type 2 diabetes on metformin 500 mg p.o. twice daily with recent A1c of 6.9%, obesity with BMI of 64.05 . She has hypertension on treatment with hydrochlorothiazide. TSH and free T4 were within normal range in July 2023.  Review of Systems  Constitutional:  No recent weight change, no fatigue, no subjective hyperthermia, no subjective hypothermia Eyes: no blurry vision, no xerophthalmia ENT: no sore throat, no nodules palpated in throat, no dysphagia/odynophagia, no hoarseness Cardiovascular: no Chest Pain, no Shortness of Breath, no palpitations, no leg swelling Respiratory: no cough, no shortness of breath Gastrointestinal: no Nausea/Vomiting/Diarhhea Musculoskeletal: no muscle/joint aches Skin: no rashes Neurological: no tremors, no numbness, no tingling, no dizziness Psychiatric: no depression, no anxiety  Objective:       08/31/2022   10:34 AM 07/25/2022   10:03 AM 07/25/2022    9:58 AM  Vitals with BMI  Height 5\' 3"   5\' 3"   Weight 353 lbs 10 oz  352 lbs 2 oz  BMI 62.65  62.39  Systolic 118 138 829  Diastolic 74 88 85  Pulse 68  75    BP 118/74   Pulse 68   Ht 5\' 3"  (1.6 m)   Wt (!) 353 lb 9.6 oz (160.4 kg)   LMP 08/30/2016   BMI 62.64 kg/m   Wt Readings from Last 3 Encounters:  08/31/22 (!) 353 lb 9.6 oz (160.4 kg)  07/25/22 (!) 352 lb 1.9 oz (159.7 kg)  06/21/22 (!) 361 lb 12.8 oz (164.1 kg)    Physical  Exam  Constitutional:  Body mass index is 62.64 kg/m.,  not in acute distress, normal state of mind Eyes: PERRLA, EOMI, no exophthalmos ENT: moist mucous membranes, + gross thyromegaly, no gross cervical lymphadenopathy   CMP ( most recent) CMP     Component Value Date/Time   NA 140 08/10/2022 0829   K 3.9 08/10/2022 0829   CL 97 08/10/2022 0829   CO2 27 08/10/2022 0829   GLUCOSE 136 (H) 08/10/2022 0829   GLUCOSE 135 (H) 11/06/2010 2348   BUN 14 08/10/2022 0829   CREATININE 0.78 08/10/2022 0829   CALCIUM 9.4 08/10/2022 0829   PROT 6.8 08/10/2022 0829   ALBUMIN 4.0 08/10/2022 0829   AST 36 08/10/2022 0829   ALT 39 (H) 08/10/2022 0829   ALKPHOS 90 08/10/2022 0829   BILITOT 0.6 08/10/2022 0829   GFRNONAA >60 11/06/2010 2348   GFRAA >60 11/06/2010 2348    Thyroid ultrasound on November 19, 2015 FINDINGS: Right thyroid lobe   Measurements: 6.8 x 3.8 x 4.5 cm. Right upper pole predominately solid nodule measures 4.9 x 3.7 x 4.5 cm   Left thyroid lobe : Measurements: 4.9 x 1.5 x 1.4 cm. Heterogeneous tissue without focal  nodule.   Isthmus : Thickness: 0.3 cm.  No nodules visualized.   Lymphadenopathy: None visualized.   IMPRESSION: Solitary large right lobe nodule measures 4.9 cm. Findings meet consensus criteria for biopsy. Ultrasound-guided fine needle aspiration should be considered, as per the consensus statement: Management of Thyroid Nodules Detected at [place]: Society of Radiologists in Ultrasound Consensus Conference Statement. Radiology 2005; [id number].   Repeat thyroid ultrasound was performed on January 21, 2022 showing   Isthmus: 0.3 cm ,previously 0.3 cm   Right lobe: 7.7  x 3.9 x 4.7 cm ,previously 6.8 x 3.8 x 4.5 cm,    Left lobe: 5.2 x 1.1 x 1.5 cm ,previously 4.9 x 1.5 x 1.4 cm  Nodule in the right lobe of her thyroid size: 6.4 cm; Other 2 dimensions: 4.8 x 3.8 cm, previously, 4.9 x 3.7 x 4.5 cm  Diabetic Labs (most recent): Lab Results   Component Value Date   HGBA1C 7.3 (H) 07/25/2022   HGBA1C 7.0 (H) 04/22/2022   HGBA1C 6.9 (H) 11/16/2021     Lab Results  Component Value Date   TSH 3.640 08/10/2022   TSH 2.300 07/25/2022   TSH 2.750 04/22/2022   TSH 3.900 01/21/2022   TSH 2.660 11/16/2021   FREET4 1.22 08/10/2022   FREET4 1.28 07/25/2022   FREET4 1.21 04/22/2022   FREET4 1.15 01/21/2022   FREET4 1.14 11/16/2021      Assessment & Plan:   1.  Euthyroid nodular goiter 2.  Morbid obesity   3.  Hypertension   4.  Vitamin D deficiency   - I have reviewed her existing and new thyroid work-up studies.   - Based on these reviews, she has history of nodular goiter, growing overall, and growing nodule on the right lobe.  This nodule was previously biopsied with bethesda 3 findings in 2017.  Patient would like to avoid biopsy at this time.  She does not have major compressive symptoms at this time.  She is advised to call clinic if she develops dysphagia, shortness of breath, nor voice change . she will have repeat ultrasound in a year.   In light of her euthyroid presentation, she will not need intervention with thyroid hormone or antithyroid medications.    In light of her metabolic dysfunction, indicated by type 2 diabetes, hypertension, would benefit from lifestyle medicine.  Whole food plant-based diet was suggested.    She is initiated on vitamin D 3 supplements.   She is advised to continue her current medications including metformin 500 mg p.o. twice daily, hydrochlorothiazide 12.5 mg p.o. daily.  - she is advised to maintain close follow up with Gilmore Laroche, FNP for primary care needs.   I spent 32 minutes in the care of the patient today including review of labs from Thyroid Function, CMP, and other relevant labs ; imaging/biopsy records (current and previous including abstractions from other facilities); face-to-face time discussing  her lab results and symptoms, medications doses, her options of short  and long term treatment based on the latest standards of care / guidelines;   and documenting the encounter.  Deretha Darnelle Going  participated in the discussions, expressed understanding, and voiced agreement with the above plans.  All questions were answered to her satisfaction. she is encouraged to contact clinic should she have any questions or concerns prior to her return visit.   Follow up plan: No follow-ups on file.   Marquis Lunch, MD [facility] [address] [place], [place] [address] Phone: [phone]  Fax: [phone]     08/31/2022, 4:18 PM  This note was partially dictated with voice recognition software. Similar sounding words can be transcribed inadequately or may not  be corrected upon review.

## 2022-09-06 ENCOUNTER — Other Ambulatory Visit: Payer: Self-pay | Admitting: Family Medicine

## 2022-09-07 ENCOUNTER — Ambulatory Visit: Payer: 59 | Admitting: Family Medicine

## 2022-09-08 ENCOUNTER — Ambulatory Visit (INDEPENDENT_AMBULATORY_CARE_PROVIDER_SITE_OTHER): Payer: 59 | Admitting: Family Medicine

## 2022-09-08 ENCOUNTER — Encounter: Payer: Self-pay | Admitting: Family Medicine

## 2022-09-08 VITALS — BP 133/85 | HR 85 | Wt 352.0 lb

## 2022-09-08 DIAGNOSIS — F3289 Other specified depressive episodes: Secondary | ICD-10-CM

## 2022-09-08 DIAGNOSIS — F419 Anxiety disorder, unspecified: Secondary | ICD-10-CM

## 2022-09-08 NOTE — Progress Notes (Signed)
Established Patient Office Visit  Subjective:  Patient ID: Norma Horton, female    DOB: 1970/07/05  Age: 52 y.o. MRN: [id number]  CC:  Chief Complaint  Patient presents with   Anxiety    Pt needs fmla forms completed has been out of work due to missing work has been under a lot of stress.    HPI Norma Horton is a 52 y.o. female with past medical history of anxiety and depression presents with request for FMLA forms completion.  She reports that she has been out of work for 2 months and is currently not in the headspace to return to work anytime soon.  She complains of increased life stresses.  No reports of suicidal thoughts and ideation.   Past Medical History:  Diagnosis Date   Anxiety    Depression    Pt has had suicide ideations in the past.   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    Past Surgical History:  Procedure Laterality Date   CHOLECYSTECTOMY     TUBAL LIGATION      Family History  Problem Relation Age of Onset   Breast cancer Maternal Grandmother    Heart attack Father    Hypertension Father    COPD Mother    Diabetes Mellitus II Brother    Throat cancer Brother    Cerebral aneurysm Sister    Cervical cancer Sister    Cervical cancer Sister    Heart attack Sister    Diabetes Daughter    Asthma Daughter    Cystic fibrosis Daughter    Hypertension Other     Social History   Socioeconomic History   Marital status: Married    Spouse name: Not on file   Number of children: 4   Years of education: Not on file   Highest education level: Not on file  Occupational History   Not on file  Tobacco Use   Smoking status: Never   Smokeless tobacco: Never  Vaping Use   Vaping Use: Never used  Substance and Sexual Activity   Alcohol use: Yes    Comment: occ   Drug use: No   Sexual activity: Yes    Birth control/protection: Surgical, Post-menopausal    Comment: tubal  Other Topics Concern    Not on file  Social History Narrative   Lives with her husband and her children    Social Determinants of Health   Financial Resource Strain: Medium Risk (02/03/2022)   Overall Financial Resource Strain (CARDIA)    Difficulty of Paying Living Expenses: Somewhat hard  Food Insecurity: No Food Insecurity (02/03/2022)   Hunger Vital Sign    Worried About Running Out of Food in the Last Year: Never true    Ran Out of Food in the Last Year: Never true  Transportation Needs: No Transportation Needs (02/03/2022)   PRAPARE - Administrator, Civil Service (Medical): No    Lack of Transportation (Non-Medical): No  Physical Activity: Inactive (02/03/2022)   Exercise Vital Sign    Days of Exercise per Week: 0 days    Minutes of Exercise per Session: 0 min  Stress: Stress Concern Present (02/03/2022)   [name]    Feeling of Stress : Very much  Social Connections: Moderately Isolated (02/03/2022)   Social Connection and Isolation Panel [NHANES]  Frequency of Communication with Friends and Family: More than three times a week    Frequency of Social Gatherings with Friends and Family: Twice a week    Attends Religious Services: Never    Database administrator or Organizations: No    Attends Banker Meetings: Never    Marital Status: Married  Catering manager Violence: Not At Risk (02/03/2022)   Humiliation, Afraid, Rape, and Kick questionnaire    Fear of Current or Ex-Partner: No    Emotionally Abused: No    Physically Abused: No    Sexually Abused: No    Outpatient Medications Prior to Visit  Medication Sig Dispense Refill   albuterol (VENTOLIN HFA) 108 (90 Base) MCG/ACT inhaler INHALE 2 PUFFS BY MOUTH EVERY 6 HOURS AS NEEDED FOR WHEEZING AND FOR SHORTNESS OF BREATH 7 each 0   amoxicillin-clavulanate (AUGMENTIN) 875-125 MG tablet Take 1 tablet by mouth every 12 (twelve) hours. 14 tablet 0    budesonide (RHINOCORT ALLERGY) 32 MCG/ACT nasal spray Place 1 spray into both nostrils 2 (two) times daily. 16 mL 1   budesonide-formoterol (SYMBICORT) 80-4.5 MCG/ACT inhaler INHALE 2 PUFFS TWICE DAILY 11 g 0   chlorthalidone (HYGROTON) 25 MG tablet Take 1 tablet (25 mg total) by mouth daily. 90 tablet 2   clonazePAM (KLONOPIN) 0.5 MG tablet Take 0.5 mg by mouth 3 (three) times daily as needed.     gabapentin (NEURONTIN) 100 MG capsule Take 1 capsule (100 mg total) by mouth at bedtime. 30 capsule 0   glimepiride (AMARYL) 2 MG tablet Take 1 tablet (2 mg total) by mouth daily before breakfast. 30 tablet 3   guaiFENesin (ROBITUSSIN) 100 MG/5ML liquid Take 5 mLs by mouth every 4 (four) hours as needed for cough or to loosen phlegm. 120 mL 0   hydrOXYzine (VISTARIL) 25 MG capsule TAKE 1 CAPSULE BY MOUTH EVERY 8 HOURS AS NEEDED 30 capsule 0   metFORMIN (GLUCOPHAGE) 1000 MG tablet Take 1 tablet (1,000 mg total) by mouth 2 (two) times daily with a meal. 180 tablet 3   methylPREDNISolone (MEDROL DOSEPAK) 4 MG TBPK tablet Take as the package instructed 1 each 0   naproxen (NAPROSYN) 500 MG tablet TAKE 1 TABLET BY MOUTH TWICE DAILY WITH A MEAL 30 tablet 0   pantoprazole (PROTONIX) 40 MG tablet Take 1 tablet (40 mg total) by mouth as needed. 30 tablet 2   promethazine-dextromethorphan (PROMETHAZINE-DM) 6.25-15 MG/5ML syrup Take 5 mLs by mouth 4 (four) times daily as needed. 118 mL 0   valACYclovir (VALTREX) 500 MG tablet Take 500 mg by mouth 1 day or 1 dose.     Vitamin D, Ergocalciferol, (DRISDOL) 1.25 MG (50000 UNIT) CAPS capsule Take 1 capsule (50,000 Units total) by mouth every 7 (seven) days. 10 capsule 1   No facility-administered medications prior to visit.    Allergies  Allergen Reactions   Fenoprofen Calcium     ROS Review of Systems  Constitutional:  Negative for chills and fever.  Eyes:  Negative for visual disturbance.  Respiratory:  Negative for chest tightness and shortness of breath.    Neurological:  Negative for dizziness and headaches.      Objective:    Physical Exam HENT:     Head: Normocephalic.     Mouth/Throat:     Mouth: Mucous membranes are moist.  Cardiovascular:     Rate and Rhythm: Normal rate.     Heart sounds: Normal heart sounds.  Pulmonary:     Effort:  Pulmonary effort is normal.     Breath sounds: Normal breath sounds.  Neurological:     Mental Status: She is alert.     BP 133/85   Pulse 85   Wt (!) 352 lb (159.7 kg)   LMP 08/30/2016   SpO2 94%   BMI 62.35 kg/m  Wt Readings from Last 3 Encounters:  09/08/22 (!) 352 lb (159.7 kg)  08/31/22 (!) 353 lb 9.6 oz (160.4 kg)  07/25/22 (!) 352 lb 1.9 oz (159.7 kg)    Lab Results  Component Value Date   TSH 3.640 08/10/2022   Lab Results  Component Value Date   WBC 9.6 07/25/2022   HGB 15.0 07/25/2022   HCT 45.7 07/25/2022   MCV 90 07/25/2022   PLT 307 07/25/2022   Lab Results  Component Value Date   NA 140 08/10/2022   K 3.9 08/10/2022   CO2 27 08/10/2022   GLUCOSE 136 (H) 08/10/2022   BUN 14 08/10/2022   CREATININE 0.78 08/10/2022   BILITOT 0.6 08/10/2022   ALKPHOS 90 08/10/2022   AST 36 08/10/2022   ALT 39 (H) 08/10/2022   PROT 6.8 08/10/2022   ALBUMIN 4.0 08/10/2022   CALCIUM 9.4 08/10/2022   EGFR 90 08/10/2022   Lab Results  Component Value Date   CHOL 167 07/25/2022   Lab Results  Component Value Date   HDL 58 07/25/2022   Lab Results  Component Value Date   LDLCALC 89 07/25/2022   Lab Results  Component Value Date   TRIG 114 07/25/2022   Lab Results  Component Value Date   CHOLHDL 2.9 07/25/2022   Lab Results  Component Value Date   HGBA1C 7.3 (H) 07/25/2022      Assessment & Plan:  Anxiety and depression Assessment & Plan: Patient is requesting FMLA forms completion She notes that she has been out of work for 2 months due to increased life stresses and is currently not in the space to return to work At her last visit in March 2024, she  reports dealing with life stresses with coping mechanism and had stopped speaking with her therapist She declines pharmacological treatment for her anxiety and depression Inform the patient that I can only provide intermittent relief for 6 months Encouraged to speak with a licensed therapist The patient agrees Referral was placed to integrated behavioral health for talk therapy Collaborative Care Treatment Plan  I reviewed the information and treatment recommendations from the [facility] Counselor and Psychiatric Consultant.  I do agree with the Collaborative Care Team's treatment plan, that includes the Consulting Psychiatrists' recommendations for medication management.  I will follow up with the patient's treatment as appropriate.  Time spent collaborating with team: 60 minutes     Other depression -     Amb ref to Integrated Behavioral Health    Follow-up: Return in about 8 weeks (around 10/31/2022).   Gilmore Laroche, FNP

## 2022-09-08 NOTE — Patient Instructions (Addendum)
I appreciate the opportunity to provide care to you today!    Follow up: 10/31/2022   Referrals today-  Esmond Harps, Darcey Nora, LCSWA for talk therapy   Please continue to a heart-healthy diet and increase your physical activities. Try to exercise for at least five days a week.      It was a pleasure to see you and I look forward to continuing to work together on your health and well-being. Please do not hesitate to call the office if you need care or have questions about your care.   Have a wonderful day and week. With Gratitude, Gilmore Laroche MSN, FNP-BC

## 2022-09-09 NOTE — Assessment & Plan Note (Addendum)
Patient is requesting FMLA forms completion She notes that she has been out of work for 2 months due to increased life stresses and is currently not in the space to return to work At her last visit in March 2024, she reports dealing with life stresses with coping mechanism and had stopped speaking with her therapist She declines pharmacological treatment for her anxiety and depression Inform the patient that I can only provide intermittent relief for 6 months Encouraged to speak with a licensed therapist The patient agrees Referral was placed to integrated behavioral health for talk therapy Collaborative Care Treatment Plan  I reviewed the information and treatment recommendations from the [facility] Counselor and Psychiatric Consultant.  I do agree with the Collaborative Care Team's treatment plan, that includes the Consulting Psychiatrists' recommendations for medication management.  I will follow up with the patient's treatment as appropriate.  Time spent collaborating with team: 60 minutes

## 2022-09-16 ENCOUNTER — Institutional Professional Consult (permissible substitution): Payer: Self-pay | Admitting: Professional Counselor

## 2022-09-21 ENCOUNTER — Ambulatory Visit (HOSPITAL_COMMUNITY): Payer: 59

## 2022-09-30 ENCOUNTER — Institutional Professional Consult (permissible substitution): Payer: Self-pay | Admitting: Professional Counselor

## 2022-10-03 ENCOUNTER — Encounter: Payer: Self-pay | Admitting: Family Medicine

## 2022-10-03 ENCOUNTER — Ambulatory Visit (INDEPENDENT_AMBULATORY_CARE_PROVIDER_SITE_OTHER): Payer: 59 | Admitting: Family Medicine

## 2022-10-03 VITALS — BP 132/82 | HR 68 | Ht 63.0 in | Wt 349.1 lb

## 2022-10-03 DIAGNOSIS — E559 Vitamin D deficiency, unspecified: Secondary | ICD-10-CM

## 2022-10-03 DIAGNOSIS — M792 Neuralgia and neuritis, unspecified: Secondary | ICD-10-CM | POA: Diagnosis not present

## 2022-10-03 MED ORDER — VITAMIN D (ERGOCALCIFEROL) 1.25 MG (50000 UNIT) PO CAPS
50000.0000 [IU] | ORAL_CAPSULE | ORAL | 1 refills | Status: DC
Start: 2022-10-03 — End: 2023-03-12

## 2022-10-03 MED ORDER — GABAPENTIN 100 MG PO CAPS: 100.0000 mg | ORAL_CAPSULE | Freq: Every day | ORAL | 2 refills | Status: DC

## 2022-10-03 MED ORDER — VITAMIN D (ERGOCALCIFEROL) 1.25 MG (50000 UNIT) PO CAPS
50000.0000 [IU] | ORAL_CAPSULE | ORAL | 1 refills | Status: DC
Start: 2022-10-03 — End: 2022-10-03

## 2022-10-03 MED ORDER — GABAPENTIN 100 MG PO CAPS
100.0000 mg | ORAL_CAPSULE | Freq: Every day | ORAL | 0 refills | Status: DC
Start: 2022-10-03 — End: 2022-10-03

## 2022-10-03 NOTE — Assessment & Plan Note (Addendum)
The patient has lost 4 pounds since 08/31/2022 Congratulated the patient on her weight loss Encourage a heart healthy diet with increased physical activity Wt Readings from Last 3 Encounters:  10/03/22 (!) 349 lb 1.3 oz (158.3 kg)  09/08/22 (!) 352 lb (159.7 kg)  08/31/22 (!) 353 lb 9.6 oz (160.4 kg)

## 2022-10-03 NOTE — Assessment & Plan Note (Signed)
She reports being out of her gabapentin 100 mg nightly since March 2024 Will send refill today Will assess BMP to assess for low calcium and potassium levels Encouraged to increase her hydration at the 64 ounces of water daily We will follow-up in 4 weeks

## 2022-10-03 NOTE — Addendum Note (Signed)
Addended by: Herbie Saxon on: 10/03/2022 09:30 AM   Modules accepted: Orders

## 2022-10-03 NOTE — Progress Notes (Signed)
Established Patient Office Visit  Subjective:  Patient ID: Norma Horton, female    DOB: 1971-04-23  Age: 52 y.o. MRN: [id number]  CC:  Chief Complaint  Patient presents with   Chronic Care Management    Follow up appt. Pt reports cramping in legs for about a month now 09/02/2022.     HPI Norma Horton is a 52 y.o. female presents with complaints of right lower extremity leg cramps. For the details of today's visit, please refer to the assessment and plan.     Past Medical History:  Diagnosis Date   Anxiety    Depression    Pt has had suicide ideations in the past.   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    Past Surgical History:  Procedure Laterality Date   CHOLECYSTECTOMY     TUBAL LIGATION      Family History  Problem Relation Age of Onset   Breast cancer Maternal Grandmother    Heart attack Father    Hypertension Father    COPD Mother    Diabetes Mellitus II Brother    Throat cancer Brother    Cerebral aneurysm Sister    Cervical cancer Sister    Cervical cancer Sister    Heart attack Sister    Diabetes Daughter    Asthma Daughter    Cystic fibrosis Daughter    Hypertension Other     Social History   Socioeconomic History   Marital status: Married    Spouse name: Not on file   Number of children: 4   Years of education: Not on file   Highest education level: Not on file  Occupational History   Not on file  Tobacco Use   Smoking status: Never   Smokeless tobacco: Never  Vaping Use   Vaping Use: Never used  Substance and Sexual Activity   Alcohol use: Yes    Comment: occ   Drug use: No   Sexual activity: Yes    Birth control/protection: Surgical, Post-menopausal    Comment: tubal  Other Topics Concern   Not on file  Social History Narrative   Lives with her husband and her children    Social Determinants of Health   Financial Resource Strain: Medium Risk (02/03/2022)   Overall  Financial Resource Strain (CARDIA)    Difficulty of Paying Living Expenses: Somewhat hard  Food Insecurity: No Food Insecurity (02/03/2022)   Hunger Vital Sign    Worried About Running Out of Food in the Last Year: Never true    Ran Out of Food in the Last Year: Never true  Transportation Needs: No Transportation Needs (02/03/2022)   PRAPARE - Administrator, Civil Service (Medical): No    Lack of Transportation (Non-Medical): No  Physical Activity: Inactive (02/03/2022)   Exercise Vital Sign    Days of Exercise per Week: 0 days    Minutes of Exercise per Session: 0 min  Stress: Stress Concern Present (02/03/2022)   [name]    Feeling of Stress : Very much  Social Connections: Moderately Isolated (02/03/2022)   Social Connection and Isolation Panel [NHANES]    Frequency of Communication with Friends and Family: More than three times a week    Frequency of Social Gatherings with Friends and Family: Twice a week    Attends Religious Services: Never  Active Member of Clubs or Organizations: No    Attends Banker Meetings: Never    Marital Status: Married  Catering manager Violence: Not At Risk (02/03/2022)   Humiliation, Afraid, Rape, and Kick questionnaire    Fear of Current or Ex-Partner: No    Emotionally Abused: No    Physically Abused: No    Sexually Abused: No    Outpatient Medications Prior to Visit  Medication Sig Dispense Refill   albuterol (VENTOLIN HFA) 108 (90 Base) MCG/ACT inhaler INHALE 2 PUFFS BY MOUTH EVERY 6 HOURS AS NEEDED FOR WHEEZING AND FOR SHORTNESS OF BREATH 7 each 0   amoxicillin-clavulanate (AUGMENTIN) 875-125 MG tablet Take 1 tablet by mouth every 12 (twelve) hours. 14 tablet 0   budesonide (RHINOCORT ALLERGY) 32 MCG/ACT nasal spray Place 1 spray into both nostrils 2 (two) times daily. 16 mL 1   budesonide-formoterol (SYMBICORT) 80-4.5 MCG/ACT inhaler INHALE 2 PUFFS  TWICE DAILY 11 g 0   chlorthalidone (HYGROTON) 25 MG tablet Take 1 tablet (25 mg total) by mouth daily. 90 tablet 2   clonazePAM (KLONOPIN) 0.5 MG tablet Take 0.5 mg by mouth 3 (three) times daily as needed.     glimepiride (AMARYL) 2 MG tablet Take 1 tablet (2 mg total) by mouth daily before breakfast. 30 tablet 3   guaiFENesin (ROBITUSSIN) 100 MG/5ML liquid Take 5 mLs by mouth every 4 (four) hours as needed for cough or to loosen phlegm. 120 mL 0   hydrOXYzine (VISTARIL) 25 MG capsule TAKE 1 CAPSULE BY MOUTH EVERY 8 HOURS AS NEEDED 30 capsule 0   metFORMIN (GLUCOPHAGE) 1000 MG tablet Take 1 tablet (1,000 mg total) by mouth 2 (two) times daily with a meal. 180 tablet 3   methylPREDNISolone (MEDROL DOSEPAK) 4 MG TBPK tablet Take as the package instructed 1 each 0   naproxen (NAPROSYN) 500 MG tablet TAKE 1 TABLET BY MOUTH TWICE DAILY WITH A MEAL 30 tablet 0   pantoprazole (PROTONIX) 40 MG tablet Take 1 tablet (40 mg total) by mouth as needed. 30 tablet 2   promethazine-dextromethorphan (PROMETHAZINE-DM) 6.25-15 MG/5ML syrup Take 5 mLs by mouth 4 (four) times daily as needed. 118 mL 0   valACYclovir (VALTREX) 500 MG tablet Take 500 mg by mouth 1 day or 1 dose.     gabapentin (NEURONTIN) 100 MG capsule Take 1 capsule (100 mg total) by mouth at bedtime. 30 capsule 0   Vitamin D, Ergocalciferol, (DRISDOL) 1.25 MG (50000 UNIT) CAPS capsule Take 1 capsule (50,000 Units total) by mouth every 7 (seven) days. 10 capsule 1   No facility-administered medications prior to visit.    Allergies  Allergen Reactions   Fenoprofen Calcium     ROS Review of Systems  Constitutional:  Negative for chills and fever.  Eyes:  Negative for visual disturbance.  Respiratory:  Negative for chest tightness and shortness of breath.   Neurological:  Negative for dizziness and headaches.      Objective:    Physical Exam HENT:     Head: Normocephalic.     Mouth/Throat:     Mouth: Mucous membranes are moist.   Cardiovascular:     Rate and Rhythm: Normal rate.     Heart sounds: Normal heart sounds.  Pulmonary:     Effort: Pulmonary effort is normal.     Breath sounds: Normal breath sounds.  Neurological:     Mental Status: She is alert.     BP 132/82   Pulse 68   Ht  5\' 3"  (1.6 m)   Wt (!) 349 lb 1.3 oz (158.3 kg)   LMP 08/30/2016   SpO2 94%   BMI 61.84 kg/m  Wt Readings from Last 3 Encounters:  10/03/22 (!) 349 lb 1.3 oz (158.3 kg)  09/08/22 (!) 352 lb (159.7 kg)  08/31/22 (!) 353 lb 9.6 oz (160.4 kg)    Lab Results  Component Value Date   TSH 3.640 08/10/2022   Lab Results  Component Value Date   WBC 9.6 07/25/2022   HGB 15.0 07/25/2022   HCT 45.7 07/25/2022   MCV 90 07/25/2022   PLT 307 07/25/2022   Lab Results  Component Value Date   NA 140 08/10/2022   K 3.9 08/10/2022   CO2 27 08/10/2022   GLUCOSE 136 (H) 08/10/2022   BUN 14 08/10/2022   CREATININE 0.78 08/10/2022   BILITOT 0.6 08/10/2022   ALKPHOS 90 08/10/2022   AST 36 08/10/2022   ALT 39 (H) 08/10/2022   PROT 6.8 08/10/2022   ALBUMIN 4.0 08/10/2022   CALCIUM 9.4 08/10/2022   EGFR 90 08/10/2022   Lab Results  Component Value Date   CHOL 167 07/25/2022   Lab Results  Component Value Date   HDL 58 07/25/2022   Lab Results  Component Value Date   LDLCALC 89 07/25/2022   Lab Results  Component Value Date   TRIG 114 07/25/2022   Lab Results  Component Value Date   CHOLHDL 2.9 07/25/2022   Lab Results  Component Value Date   HGBA1C 7.3 (H) 07/25/2022      Assessment & Plan:  Neuropathic pain of right lower extremity Assessment & Plan: She reports being out of her gabapentin 100 mg nightly since March 2024 Will send refill today Will assess BMP to assess for low calcium and potassium levels Encouraged to increase her hydration at the 64 ounces of water daily We will follow-up in 4 weeks  Orders: -     BMP8+EGFR -     Gabapentin; Take 1 capsule (100 mg total) by mouth at bedtime.   Dispense: 30 capsule; Refill: 2  Morbid obesity (HCC) Assessment & Plan: The patient has lost 4 pounds since 08/31/2022 Congratulated the patient on her weight loss Encourage a heart healthy diet with increased physical activity Wt Readings from Last 3 Encounters:  10/03/22 (!) 349 lb 1.3 oz (158.3 kg)  09/08/22 (!) 352 lb (159.7 kg)  08/31/22 (!) 353 lb 9.6 oz (160.4 kg)      Vitamin D deficiency -     Vitamin D (Ergocalciferol); Take 1 capsule (50,000 Units total) by mouth every 7 (seven) days.  Dispense: 20 capsule; Refill: 1    Follow-up: Return in about 1 month (around 11/02/2022).   Gilmore Laroche, FNP

## 2022-10-03 NOTE — Patient Instructions (Signed)
I appreciate the opportunity to provide care to you today!    Follow up:  1 months  Labs: please stop by the lab today to get your blood drawn ( BMP)  Please pick up your refills at the pharmacy  Please increase your fluid intake to at 64 ounces daily   Please continue to a heart-healthy diet and increase your physical activities. Try to exercise for at least five days a week.      It was a pleasure to see you and I look forward to continuing to work together on your health and well-being. Please do not hesitate to call the office if you need care or have questions about your care.   Have a wonderful day and week. With Gratitude, Gilmore Laroche MSN, FNP-BC

## 2022-10-04 LAB — BMP8+EGFR
BUN/Creatinine Ratio: 19 (ref 9–23)
BUN: 14 mg/dL (ref 6–24)
CO2: 26 mmol/L (ref 20–29)
Calcium: 10 mg/dL (ref 8.7–10.2)
Chloride: 99 mmol/L (ref 96–106)
Creatinine, Ser: 0.75 mg/dL (ref 0.57–1.00)
Glucose: 136 mg/dL — ABNORMAL HIGH (ref 70–99)
Potassium: 4 mmol/L (ref 3.5–5.2)
Sodium: 144 mmol/L (ref 134–144)
eGFR: 95 mL/min/{1.73_m2} (ref >59)

## 2022-10-04 NOTE — Progress Notes (Signed)
Please inform the patient that her potassium and calcium levels are stable.  I recommend to continue treatment regimen discussed during her office visit.

## 2022-10-10 ENCOUNTER — Ambulatory Visit (HOSPITAL_COMMUNITY): Admission: RE | Admit: 2022-10-10 | Payer: 59 | Source: Ambulatory Visit

## 2022-10-28 ENCOUNTER — Ambulatory Visit (INDEPENDENT_AMBULATORY_CARE_PROVIDER_SITE_OTHER): Payer: 59 | Admitting: Professional Counselor

## 2022-10-28 DIAGNOSIS — F3289 Other specified depressive episodes: Secondary | ICD-10-CM

## 2022-10-28 DIAGNOSIS — F331 Major depressive disorder, recurrent, moderate: Secondary | ICD-10-CM

## 2022-10-28 NOTE — Patient Instructions (Addendum)
[facility] recommends practicing mindfulness and anger management strategies when agitated.  https://www.therapistaid.com/worksheets/deep-breathing-worksheet https://www.therapistaid.com/therapy-worksheet/anger-management-skills  [facility] recommends increasing physical activity and decrease screen time.   If your symptoms worsen or you have thoughts of suicide/homicide, PLEASE SEEK IMMEDIATE MEDICAL ATTENTION.  You may always call:   National Suicide Hotline: 988 or 256-117-7313 [place] Crisis Line: (248) 855-0822 Crisis Recovery in [place]: 254-071-6141     These are available 24 hours a day, 7 days a week.
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
## 2022-10-31 ENCOUNTER — Encounter: Payer: Self-pay | Admitting: Family Medicine

## 2022-10-31 ENCOUNTER — Ambulatory Visit (INDEPENDENT_AMBULATORY_CARE_PROVIDER_SITE_OTHER): Payer: 59 | Admitting: Family Medicine

## 2022-10-31 VITALS — BP 138/86 | HR 70 | Ht 63.0 in | Wt 349.1 lb

## 2022-10-31 DIAGNOSIS — E1169 Type 2 diabetes mellitus with other specified complication: Secondary | ICD-10-CM

## 2022-10-31 DIAGNOSIS — E119 Type 2 diabetes mellitus without complications: Secondary | ICD-10-CM

## 2022-10-31 DIAGNOSIS — E559 Vitamin D deficiency, unspecified: Secondary | ICD-10-CM

## 2022-10-31 DIAGNOSIS — I10 Essential (primary) hypertension: Secondary | ICD-10-CM

## 2022-10-31 DIAGNOSIS — E785 Hyperlipidemia, unspecified: Secondary | ICD-10-CM

## 2022-10-31 DIAGNOSIS — E038 Other specified hypothyroidism: Secondary | ICD-10-CM

## 2022-10-31 DIAGNOSIS — K219 Gastro-esophageal reflux disease without esophagitis: Secondary | ICD-10-CM | POA: Diagnosis not present

## 2022-10-31 DIAGNOSIS — R7301 Impaired fasting glucose: Secondary | ICD-10-CM

## 2022-10-31 DIAGNOSIS — Z1211 Encounter for screening for malignant neoplasm of colon: Secondary | ICD-10-CM

## 2022-10-31 NOTE — Progress Notes (Signed)
Established Patient Office Visit  Subjective:  Patient ID: Norma Horton, female    DOB: 1970-08-28  Age: 52 y.o. MRN: [id number]  CC:  Chief Complaint  Patient presents with   Chronic Care Management    3 month f/u    HPI Norma Horton is a 51 y.o. female with past medical history of essential hypertension, moderate persistent asthma, GERD, type 2 diabetes, anxiety and depression presents for f/u of  chronic medical conditions. For the details of today's visit, please refer to the assessment and plan.     Past Medical History:  Diagnosis Date   Anxiety    Depression    Pt has had suicide ideations in the past.   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    Past Surgical History:  Procedure Laterality Date   CHOLECYSTECTOMY     TUBAL LIGATION      Family History  Problem Relation Age of Onset   Breast cancer Maternal Grandmother    Heart attack Father    Hypertension Father    COPD Mother    Diabetes Mellitus II Brother    Throat cancer Brother    Cerebral aneurysm Sister    Cervical cancer Sister    Cervical cancer Sister    Heart attack Sister    Diabetes Daughter    Asthma Daughter    Cystic fibrosis Daughter    Hypertension Other     Social History   Socioeconomic History   Marital status: Married    Spouse name: Not on file   Number of children: 4   Years of education: Not on file   Highest education level: Not on file  Occupational History   Not on file  Tobacco Use   Smoking status: Never   Smokeless tobacco: Never  Vaping Use   Vaping Use: Never used  Substance and Sexual Activity   Alcohol use: Yes    Comment: occ   Drug use: No   Sexual activity: Yes    Birth control/protection: Surgical, Post-menopausal    Comment: tubal  Other Topics Concern   Not on file  Social History Narrative   Lives with her husband and her children    Social Determinants of Health   Financial  Resource Strain: Medium Risk (02/03/2022)   Overall Financial Resource Strain (CARDIA)    Difficulty of Paying Living Expenses: Somewhat hard  Food Insecurity: No Food Insecurity (02/03/2022)   Hunger Vital Sign    Worried About Running Out of Food in the Last Year: Never true    Ran Out of Food in the Last Year: Never true  Transportation Needs: No Transportation Needs (02/03/2022)   PRAPARE - Administrator, Civil Service (Medical): No    Lack of Transportation (Non-Medical): No  Physical Activity: Inactive (02/03/2022)   Exercise Vital Sign    Days of Exercise per Week: 0 days    Minutes of Exercise per Session: 0 min  Stress: Stress Concern Present (02/03/2022)   [name]    Feeling of Stress : Very much  Social Connections: Moderately Isolated (02/03/2022)   Social Connection and Isolation Panel [NHANES]    Frequency of Communication with Friends and Family: More than three times a week    Frequency of Social Gatherings with Friends and Family: Twice a week    Attends Religious  Services: Never    Active Member of Clubs or Organizations: No    Attends Banker Meetings: Never    Marital Status: Married  Catering manager Violence: Not At Risk (02/03/2022)   Humiliation, Afraid, Rape, and Kick questionnaire    Fear of Current or Ex-Partner: No    Emotionally Abused: No    Physically Abused: No    Sexually Abused: No    Outpatient Medications Prior to Visit  Medication Sig Dispense Refill   albuterol (VENTOLIN HFA) 108 (90 Base) MCG/ACT inhaler INHALE 2 PUFFS BY MOUTH EVERY 6 HOURS AS NEEDED FOR WHEEZING AND FOR SHORTNESS OF BREATH 7 each 0   budesonide (RHINOCORT ALLERGY) 32 MCG/ACT nasal spray Place 1 spray into both nostrils 2 (two) times daily. 16 mL 1   budesonide-formoterol (SYMBICORT) 80-4.5 MCG/ACT inhaler INHALE 2 PUFFS TWICE DAILY 11 g 0   chlorthalidone (HYGROTON) 25 MG tablet Take 1  tablet (25 mg total) by mouth daily. 90 tablet 2   clonazePAM (KLONOPIN) 0.5 MG tablet Take 0.5 mg by mouth 3 (three) times daily as needed.     gabapentin (NEURONTIN) 100 MG capsule Take 1 capsule (100 mg total) by mouth at bedtime. 30 capsule 2   glimepiride (AMARYL) 2 MG tablet Take 1 tablet (2 mg total) by mouth daily before breakfast. 30 tablet 3   metFORMIN (GLUCOPHAGE) 1000 MG tablet Take 1 tablet (1,000 mg total) by mouth 2 (two) times daily with a meal. 180 tablet 3   naproxen (NAPROSYN) 500 MG tablet TAKE 1 TABLET BY MOUTH TWICE DAILY WITH A MEAL 30 tablet 0   pantoprazole (PROTONIX) 40 MG tablet Take 1 tablet (40 mg total) by mouth as needed. 30 tablet 2   valACYclovir (VALTREX) 500 MG tablet Take 500 mg by mouth 1 day or 1 dose.     Vitamin D, Ergocalciferol, (DRISDOL) 1.25 MG (50000 UNIT) CAPS capsule Take 1 capsule (50,000 Units total) by mouth every 7 (seven) days. 20 capsule 1   amoxicillin-clavulanate (AUGMENTIN) 875-125 MG tablet Take 1 tablet by mouth every 12 (twelve) hours. 14 tablet 0   guaiFENesin (ROBITUSSIN) 100 MG/5ML liquid Take 5 mLs by mouth every 4 (four) hours as needed for cough or to loosen phlegm. 120 mL 0   hydrOXYzine (VISTARIL) 25 MG capsule TAKE 1 CAPSULE BY MOUTH EVERY 8 HOURS AS NEEDED 30 capsule 0   methylPREDNISolone (MEDROL DOSEPAK) 4 MG TBPK tablet Take as the package instructed 1 each 0   promethazine-dextromethorphan (PROMETHAZINE-DM) 6.25-15 MG/5ML syrup Take 5 mLs by mouth 4 (four) times daily as needed. 118 mL 0   No facility-administered medications prior to visit.    Allergies  Allergen Reactions   Fenoprofen Calcium     ROS Review of Systems  Constitutional:  Negative for chills and fever.  Eyes:  Negative for visual disturbance.  Respiratory:  Negative for chest tightness and shortness of breath.   Neurological:  Negative for dizziness and headaches.      Objective:    Physical Exam HENT:     Head: Normocephalic.      Mouth/Throat:     Mouth: Mucous membranes are moist.  Cardiovascular:     Rate and Rhythm: Normal rate.     Heart sounds: Normal heart sounds.  Pulmonary:     Effort: Pulmonary effort is normal.     Breath sounds: Normal breath sounds.  Neurological:     Mental Status: She is alert.     BP 138/86  Pulse 70   Ht 5\' 3"  (1.6 m)   Wt (!) 349 lb 1.9 oz (158.4 kg)   LMP 08/30/2016   SpO2 95%   BMI 61.84 kg/m  Wt Readings from Last 3 Encounters:  10/31/22 (!) 349 lb 1.9 oz (158.4 kg)  10/03/22 (!) 349 lb 1.3 oz (158.3 kg)  09/08/22 (!) 352 lb (159.7 kg)    Lab Results  Component Value Date   TSH 3.640 08/10/2022   Lab Results  Component Value Date   WBC 9.6 07/25/2022   HGB 15.0 07/25/2022   HCT 45.7 07/25/2022   MCV 90 07/25/2022   PLT 307 07/25/2022   Lab Results  Component Value Date   NA 144 10/03/2022   K 4.0 10/03/2022   CO2 26 10/03/2022   GLUCOSE 136 (H) 10/03/2022   BUN 14 10/03/2022   CREATININE 0.75 10/03/2022   BILITOT 0.6 08/10/2022   ALKPHOS 90 08/10/2022   AST 36 08/10/2022   ALT 39 (H) 08/10/2022   PROT 6.8 08/10/2022   ALBUMIN 4.0 08/10/2022   CALCIUM 10.0 10/03/2022   EGFR 95 10/03/2022   Lab Results  Component Value Date   CHOL 167 07/25/2022   Lab Results  Component Value Date   HDL 58 07/25/2022   Lab Results  Component Value Date   LDLCALC 89 07/25/2022   Lab Results  Component Value Date   TRIG 114 07/25/2022   Lab Results  Component Value Date   CHOLHDL 2.9 07/25/2022   Lab Results  Component Value Date   HGBA1C 7.3 (H) 07/25/2022      Assessment & Plan:  Type 2 diabetes mellitus without complication, without long-term current use of insulin (HCC) Assessment & Plan: Denies polyuria, polyphagia, polydipsia Refilled metformin at 1000 mg twice daily Recommend avoiding simple carbohydrates, including cakes, sweet desserts, ice cream, soda (diet or regular), sweet tea, candies, chips, cookies, store-bought juices,  alcohol in excess of 1-2 drinks a day, lemonade, artificial sweeteners, donuts, coffee creamers, and sugar-free products.  I recommend avoiding greasy, fatty foods with increased physical activity.  Lab Results  Component Value Date   HGBA1C 7.3 (H) 07/25/2022      Essential hypertension, benign Assessment & Plan: Controlled She takes chlorthalidone 25 mg daily She reports compliance with treatment regimen  Low sodium diet with increase physical activity encouraged BP Readings from Last 3 Encounters:  10/31/22 138/86  10/03/22 132/82  09/08/22 133/85      Gastroesophageal reflux disease without esophagitis Assessment & Plan: Stable with Protonix 40 mg PRN She denies dyspepsia and heartburn  Encouraged to continue treatment regimen   Hyperlipidemia associated with type 2 diabetes mellitus (HCC) -     Lipid panel -     CMP14+EGFR -     CBC with Differential/Platelet  Colon cancer screening -     Cologuard  IFG (impaired fasting glucose) -     Hemoglobin A1c  Vitamin D deficiency -     VITAMIN D 25 Hydroxy (Vit-D Deficiency, Fractures)  Other specified hypothyroidism -     TSH + free T4    Follow-up: Return in about 3 months (around 01/31/2023).   Gilmore Laroche, FNP

## 2022-10-31 NOTE — Assessment & Plan Note (Addendum)
Stable with Protonix 40 mg PRN She denies dyspepsia and heartburn  Encouraged to continue treatment regimen

## 2022-10-31 NOTE — Assessment & Plan Note (Signed)
Controlled She takes chlorthalidone 25 mg daily She reports compliance with treatment regimen  Low sodium diet with increase physical activity encouraged BP Readings from Last 3 Encounters:  10/31/22 138/86  10/03/22 132/82  09/08/22 133/85

## 2022-10-31 NOTE — Patient Instructions (Signed)
I appreciate the opportunity to provide care to you today!    Follow up:  3 months  Labs: please stop by the lab today to get your blood drawn (CBC, CMP, TSH, Lipid profile, HgA1c, Vit D)     Please continue to a heart-healthy diet and increase your physical activities. Try to exercise for 30mins at least five days a week.      It was a pleasure to see you and I look forward to continuing to work together on your health and well-being. Please do not hesitate to call the office if you need care or have questions about your care.   Have a wonderful day and week. With Gratitude, Jaythen Hamme MSN, FNP-BC  

## 2022-10-31 NOTE — Assessment & Plan Note (Signed)
Denies polyuria, polyphagia, polydipsia Refilled metformin at 1000 mg twice daily Recommend avoiding simple carbohydrates, including cakes, sweet desserts, ice cream, soda (diet or regular), sweet tea, candies, chips, cookies, store-bought juices, alcohol in excess of 1-2 drinks a day, lemonade, artificial sweeteners, donuts, coffee creamers, and sugar-free products.  I recommend avoiding greasy, fatty foods with increased physical activity.  Lab Results  Component Value Date   HGBA1C 7.3 (H) 07/25/2022

## 2022-11-01 LAB — CMP14+EGFR
ALT: 37 IU/L — ABNORMAL HIGH (ref 0–32)
AST: 33 IU/L (ref 0–40)
Albumin: 4.3 g/dL (ref 3.8–4.9)
Alkaline Phosphatase: 92 IU/L (ref 44–121)
BUN/Creatinine Ratio: 13 (ref 9–23)
BUN: 11 mg/dL (ref 6–24)
Bilirubin Total: 0.9 mg/dL (ref 0.0–1.2)
CO2: 23 mmol/L (ref 20–29)
Calcium: 10 mg/dL (ref 8.7–10.2)
Chloride: 101 mmol/L (ref 96–106)
Creatinine, Ser: 0.84 mg/dL (ref 0.57–1.00)
Globulin, Total: 3 g/dL (ref 1.5–4.5)
Glucose: 131 mg/dL — ABNORMAL HIGH (ref 70–99)
Potassium: 4.1 mmol/L (ref 3.5–5.2)
Sodium: 143 mmol/L (ref 134–144)
Total Protein: 7.3 g/dL (ref 6.0–8.5)
eGFR: 83 mL/min/{1.73_m2} (ref >59)

## 2022-11-01 LAB — CBC WITH DIFFERENTIAL/PLATELET
Basophils Absolute: 0 10*3/uL (ref 0.0–0.2)
Basos: 1 %
EOS (ABSOLUTE): 0.3 10*3/uL (ref 0.0–0.4)
Eos: 3 %
Hematocrit: 47.9 % — ABNORMAL HIGH (ref 34.0–46.6)
Hemoglobin: 15.8 g/dL (ref 11.1–15.9)
Immature Grans (Abs): 0 10*3/uL (ref 0.0–0.1)
Immature Granulocytes: 0 %
Lymphocytes Absolute: 2.5 10*3/uL (ref 0.7–3.1)
Lymphs: 28 %
MCH: 30.7 pg (ref 26.6–33.0)
MCHC: 33 g/dL (ref 31.5–35.7)
MCV: 93 fL (ref 79–97)
Monocytes Absolute: 0.6 10*3/uL (ref 0.1–0.9)
Monocytes: 7 %
Neutrophils Absolute: 5.4 10*3/uL (ref 1.4–7.0)
Neutrophils: 61 %
Platelets: 349 10*3/uL (ref 150–450)
RBC: 5.15 x10E6/uL (ref 3.77–5.28)
RDW: 13.5 % (ref 11.7–15.4)
WBC: 8.8 10*3/uL (ref 3.4–10.8)

## 2022-11-01 LAB — LIPID PANEL
Chol/HDL Ratio: 3.2 ratio (ref 0.0–4.4)
Cholesterol, Total: 183 mg/dL (ref 100–199)
HDL: 57 mg/dL (ref >39)
LDL Chol Calc (NIH): 105 mg/dL — ABNORMAL HIGH (ref 0–99)
Triglycerides: 118 mg/dL (ref 0–149)
VLDL Cholesterol Cal: 21 mg/dL (ref 5–40)

## 2022-11-01 LAB — VITAMIN D 25 HYDROXY (VIT D DEFICIENCY, FRACTURES): Vit D, 25-Hydroxy: 28.9 ng/mL — ABNORMAL LOW (ref 30.0–100.0)

## 2022-11-01 LAB — HEMOGLOBIN A1C
Est. average glucose Bld gHb Est-mCnc: 154 mg/dL
Hgb A1c MFr Bld: 7 % — ABNORMAL HIGH (ref 4.8–5.6)

## 2022-11-01 LAB — TSH+FREE T4
Free T4: 1.15 ng/dL (ref 0.82–1.77)
TSH: 2.92 u[IU]/mL (ref 0.450–4.500)

## 2022-11-01 NOTE — Progress Notes (Signed)
Please inform the patient her hemoglobin A1c has decreased from 7.3 to 7.0.  Please encourage the patient to continue taking metformin at 1000 mg twice daily and glimepiride 2 mg daily.  I also recommend decreasing her intake of high sugar foods and beverages.  Her vitamin D levels have increased from 16.8 to 28.9.  Please encourage the patient to continue taking her weekly vitamin D supplement.  Her LDL cholesterol is slightly elevated.  I want her LDL cholesterol to be less than 100.  I recommend decreasing her intake of greasy fatty starchy foods with increased physical activity.  All other labs are stable

## 2022-11-02 ENCOUNTER — Telehealth (INDEPENDENT_AMBULATORY_CARE_PROVIDER_SITE_OTHER): Payer: Self-pay | Admitting: Professional Counselor

## 2022-11-02 DIAGNOSIS — F331 Major depressive disorder, recurrent, moderate: Secondary | ICD-10-CM
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
## 2022-11-09 ENCOUNTER — Ambulatory Visit: Payer: 59 | Admitting: Family Medicine

## 2022-11-11 ENCOUNTER — Ambulatory Visit (INDEPENDENT_AMBULATORY_CARE_PROVIDER_SITE_OTHER): Payer: Self-pay | Admitting: Professional Counselor

## 2022-11-11 DIAGNOSIS — F331 Major depressive disorder, recurrent, moderate: Secondary | ICD-10-CM

## 2022-11-11 NOTE — Patient Instructions (Signed)
Practice anger management tips.  Practice mindfulness.  If your symptoms worsen or you have thoughts of suicide/homicide, PLEASE SEEK IMMEDIATE MEDICAL ATTENTION.  You may always call:   National Suicide Hotline: 988 or 618-070-2996 [place] Crisis Line: 573-351-8314 Crisis Recovery in [place]: ([phone]     These are available 24 hours a day, 7 days a week.

## 2022-11-11 NOTE — BH Specialist Note (Signed)
Norma Horton BH Follow-up  MRN: [id number] NAME: Norma Horton Date: 11/11/22  Start time: Start Time: 0945 End time: Stop Time: 1000 Total time: Total Time in Minutes (Visit): 15 Call number: Visit Number: 3- Third Visit  Reason for call today:  The patient is a 52 year old female presenting for a collaborative care follow-up. She reports continued struggles with depression and anxiety, and recently experienced suicidal thoughts. Specifically, she had a thought of driving her car into a tree but was able to calm herself down, noting that she did not have a plan or intent to harm herself. This incident was triggered by a recent interaction with a female, leaving her very emotional and tearful.  The behavioral health counselor assessed her current suicidality, and the patient reported no current thoughts of harming herself, explaining that she was just overwhelmed last week. The counselor provided her with a safety plan, instructing her to go to the local emergency room or call [phone] immediately if her symptoms worsen or if she starts to have thoughts that escalate into a plan or intent. The patient was receptive to this information.  The course of treatment was discussed, and the patient is now open to the possibility of medication. A referral for a psychiatric evaluation was placed to determine the appropriate diagnosis and course of treatment. The patient was open to this referral. The behavioral health counselor will continue to see her bi-weekly to help her get connected to psychiatry and to provide CBT and mindfulness counseling to help reduce her symptoms. The patient reports that she is open to this plan.   PHQ-9 Scores:     11/11/2022    9:47 AM 10/31/2022    9:35 AM 10/31/2022    9:17 AM 10/28/2022    9:19 AM 10/03/2022    8:51 AM  Depression screen PHQ 2/9  Decreased Interest 3 3 0 3 0  Down, Depressed, Hopeless 3 3 0 3 0  PHQ - 2 Score 6 6 0 6 0  Altered sleeping 2 3 0 3 0  Tired,  decreased energy 3 3 0 3 0  Change in appetite 1 0 0 0 0  Feeling bad or failure about yourself  0 1 0 2 0  Trouble concentrating 1 1 0 2 0  Moving slowly or fidgety/restless 0 0 0 0 0  Suicidal thoughts 1 0 0 1 0  PHQ-9 Score 14 14 0 17 0  Difficult doing work/chores  Not difficult at all Not difficult at all  Not difficult at all   GAD-7 Scores:     11/11/2022    9:50 AM 10/31/2022    9:35 AM 10/31/2022    9:17 AM 10/28/2022    9:29 AM  GAD 7 : Generalized Anxiety Score  Nervous, Anxious, on Edge 1 3 0 1  Control/stop worrying 3 3 0 3  Worry too much - different things 3 3 0 3  Trouble relaxing 3 3 0 3  Restless 0 3 0 0  Easily annoyed or irritable 2 3 0 3  Afraid - awful might happen 0 3 0 0  Total GAD 7 Score 12 21 0 13  Anxiety Difficulty Somewhat difficult Not difficult at all Not difficult at all Not difficult at all    Stress Current stressors:  Living situation, relationship,  Sleep:  Still not sleeping good. Appetite:  Fair Coping ability:  Fair Patient taking medications as prescribed:  Yes  Current medications:  Outpatient Encounter Medications as of 11/11/2022  Medication Sig   albuterol (VENTOLIN HFA) 108 (90 Base) MCG/ACT inhaler INHALE 2 PUFFS BY MOUTH EVERY 6 HOURS AS NEEDED FOR WHEEZING AND FOR SHORTNESS OF BREATH   budesonide (RHINOCORT ALLERGY) 32 MCG/ACT nasal spray Place 1 spray into both nostrils 2 (two) times daily.   budesonide-formoterol (SYMBICORT) 80-4.5 MCG/ACT inhaler INHALE 2 PUFFS TWICE DAILY   chlorthalidone (HYGROTON) 25 MG tablet Take 1 tablet (25 mg total) by mouth daily.   clonazePAM (KLONOPIN) 0.5 MG tablet Take 0.5 mg by mouth 3 (three) times daily as needed.   gabapentin (NEURONTIN) 100 MG capsule Take 1 capsule (100 mg total) by mouth at bedtime.   glimepiride (AMARYL) 2 MG tablet Take 1 tablet (2 mg total) by mouth daily before breakfast.   metFORMIN (GLUCOPHAGE) 1000 MG tablet Take 1 tablet (1,000 mg total) by mouth 2 (two) times daily  with a meal.   naproxen (NAPROSYN) 500 MG tablet TAKE 1 TABLET BY MOUTH TWICE DAILY WITH A MEAL   pantoprazole (PROTONIX) 40 MG tablet Take 1 tablet (40 mg total) by mouth as needed.   valACYclovir (VALTREX) 500 MG tablet Take 500 mg by mouth 1 day or 1 dose.   Vitamin D, Ergocalciferol, (DRISDOL) 1.25 MG (50000 UNIT) CAPS capsule Take 1 capsule (50,000 Units total) by mouth every 7 (seven) days.   No facility-administered encounter medications on file as of 11/11/2022.     Self-harm Behaviors Risk Assessment Self-harm risk factors:  Moderate Risk due to having thoughts of self harm last week but not no thoughts today.  Patient endorses recent thoughts of harming self:  "not today"  Flowsheet Row Integrated Behavioral Health from 11/11/2022 in [facility] Integrated Behavioral Health from 10/28/2022 in [facility] ED from 06/30/2022 in [facility] [place]  C-SSRS RISK CATEGORY Moderate Risk Moderate Risk No Risk      If your symptoms worsen or you have thoughts of suicide/homicide, PLEASE SEEK IMMEDIATE MEDICAL ATTENTION.  You may always call:   National Suicide Hotline: 988 or 769-871-4879 [place] Crisis Line: (787) 428-3661 Crisis Recovery in [place]: 878-654-0975     These are available 24 hours a day, 7 days a week.     Goals, Interventions and Follow-up Plan Goals: Increase healthy adjustment to current life circumstances  Patient is looking for stress management and help with current relationship conflict.   Interventions: Mindfulness or Relaxation Training and Behavioral Activation Follow-up Plan:  Continue to see bi-weekly and recommend psychiatric evaluation.     Reuel Boom

## 2022-11-16 ENCOUNTER — Telehealth: Payer: Self-pay | Admitting: Family Medicine

## 2022-11-16 NOTE — Telephone Encounter (Signed)
Patient calling wanting to know if she is able to get her medical leave extended. Please advise Thank You

## 2022-11-17 ENCOUNTER — Ambulatory Visit (HOSPITAL_COMMUNITY)
Admission: RE | Admit: 2022-11-17 | Discharge: 2022-11-17 | Disposition: A | Discharge status: Home / Self Care | Payer: 59 | Source: Ambulatory Visit | Attending: "Endocrinology | Admitting: "Endocrinology

## 2022-11-17 DIAGNOSIS — E049 Nontoxic goiter, unspecified: Secondary | ICD-10-CM | POA: Insufficient documentation

## 2022-11-17 NOTE — Telephone Encounter (Signed)
Pt would like to extend her leave, spoke with therapist and he suggested for her to take more time, her psych appt is still pending , has been awaiting a call, would like to see them before she decided when to return, has been stressed and crying a lot.

## 2022-11-23 NOTE — Telephone Encounter (Signed)
Please advise the patient to bring the necessary forms to request an extension of her leave

## 2022-11-24 NOTE — Telephone Encounter (Signed)
Spoke to pt advised her we need forms states she will call employer to have them fax forms to [place].

## 2022-11-29 ENCOUNTER — Ambulatory Visit (INDEPENDENT_AMBULATORY_CARE_PROVIDER_SITE_OTHER): Payer: Self-pay | Admitting: Internal Medicine

## 2022-11-29 ENCOUNTER — Encounter: Payer: Self-pay | Admitting: Internal Medicine

## 2022-11-29 VITALS — BP 136/88 | HR 74 | Ht 63.0 in | Wt 354.6 lb

## 2022-11-29 DIAGNOSIS — L559 Sunburn, unspecified: Secondary | ICD-10-CM

## 2022-11-29 DIAGNOSIS — N3000 Acute cystitis without hematuria: Secondary | ICD-10-CM | POA: Insufficient documentation

## 2022-11-29 LAB — POCT URINALYSIS DIP (CLINITEK)
Bilirubin, UA: NEGATIVE
Glucose, UA: NEGATIVE mg/dL
Ketones, POC UA: NEGATIVE mg/dL
Nitrite, UA: NEGATIVE
POC PROTEIN,UA: NEGATIVE
Spec Grav, UA: 1.015 (ref 1.010–1.025)
Urobilinogen, UA: 0.2 E.U./dL
pH, UA: 8.5 — AB (ref 5.0–8.0)

## 2022-11-29 MED ORDER — CALAMINE EX LOTN: 1.0000 | TOPICAL_LOTION | CUTANEOUS | 0 refills | Status: DC | PRN

## 2022-11-29 MED ORDER — SULFAMETHOXAZOLE-TRIMETHOPRIM 800-160 MG PO TABS
1.0000 | ORAL_TABLET | Freq: Two times a day (BID) | ORAL | 0 refills | Status: DC
Start: 2022-11-29 — End: 2023-04-20

## 2022-11-29 NOTE — Patient Instructions (Addendum)
Please start taking Bactrim as prescribed.  Okay to take Azo for burning while urinating.  Please apply Calamine lotion over sunburn area. Please apply SPF50 sunscreen before going outdoors.

## 2022-11-29 NOTE — Progress Notes (Unsigned)
Acute Office Visit  Subjective:    Patient ID: Norma Horton, female    DOB: 1970/12/16, 52 y.o.   MRN: [id number]  Chief Complaint  Patient presents with   Urinary Tract Infection    Patient is having pain and pressure when urinating    Sunburn    Patient is sunburned and has been having headache and throwing up     HPI Patient is in today for complaint of dysuria and urinary frequency for the last 5 days.  She also reports nausea and headache.  Denies any fever or chills.  Denies any hematuria.  She went to beach in the last week and had not applied sunscreen on the first day.  She has redness and irritation in the shoulder and upper chest wall area.  Does not have any blister formation.  Past Medical History:  Diagnosis Date   Anxiety    Depression    Pt has had suicide ideations in the past.   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    Past Surgical History:  Procedure Laterality Date   CHOLECYSTECTOMY     TUBAL LIGATION      Family History  Problem Relation Age of Onset   Breast cancer Maternal Grandmother    Heart attack Father    Hypertension Father    COPD Mother    Diabetes Mellitus II Brother    Throat cancer Brother    Cerebral aneurysm Sister    Cervical cancer Sister    Cervical cancer Sister    Heart attack Sister    Diabetes Daughter    Asthma Daughter    Cystic fibrosis Daughter    Hypertension Other     Social History   Socioeconomic History   Marital status: Married    Spouse name: Not on file   Number of children: 4   Years of education: Not on file   Highest education level: Not on file  Occupational History   Not on file  Tobacco Use   Smoking status: Never   Smokeless tobacco: Never  Vaping Use   Vaping status: Never Used  Substance and Sexual Activity   Alcohol use: Yes    Comment: occ   Drug use: No   Sexual activity: Yes    Birth control/protection: Surgical,  Post-menopausal    Comment: tubal  Other Topics Concern   Not on file  Social History Narrative   Lives with her husband and her children    Social Determinants of Health   Financial Resource Strain: Medium Risk (02/03/2022)   Overall Financial Resource Strain (CARDIA)    Difficulty of Paying Living Expenses: Somewhat hard  Food Insecurity: No Food Insecurity (02/03/2022)   Hunger Vital Sign    Worried About Running Out of Food in the Last Year: Never true    Ran Out of Food in the Last Year: Never true  Transportation Needs: No Transportation Needs (02/03/2022)   PRAPARE - Administrator, Civil Service (Medical): No    Lack of Transportation (Non-Medical): No  Physical Activity: Inactive (02/03/2022)   Exercise Vital Sign    Days of Exercise per Week: 0 days    Minutes of Exercise per Session: 0 min  Stress: Stress Concern Present (02/03/2022)   [name]    Feeling of Stress : Very much  Social Connections:  Moderately Isolated (02/03/2022)   Social Connection and Isolation Panel [NHANES]    Frequency of Communication with Friends and Family: More than three times a week    Frequency of Social Gatherings with Friends and Family: Twice a week    Attends Religious Services: Never    Database administrator or Organizations: No    Attends Banker Meetings: Never    Marital Status: Married  Catering manager Violence: Not At Risk (02/03/2022)   Humiliation, Afraid, Rape, and Kick questionnaire    Fear of Current or Ex-Partner: No    Emotionally Abused: No    Physically Abused: No    Sexually Abused: No    Outpatient Medications Prior to Visit  Medication Sig Dispense Refill   albuterol (VENTOLIN HFA) 108 (90 Base) MCG/ACT inhaler INHALE 2 PUFFS BY MOUTH EVERY 6 HOURS AS NEEDED FOR WHEEZING AND FOR SHORTNESS OF BREATH 7 each 0   budesonide (RHINOCORT ALLERGY) 32 MCG/ACT nasal spray Place 1  spray into both nostrils 2 (two) times daily. 16 mL 1   budesonide-formoterol (SYMBICORT) 80-4.5 MCG/ACT inhaler INHALE 2 PUFFS TWICE DAILY 11 g 0   chlorthalidone (HYGROTON) 25 MG tablet Take 1 tablet (25 mg total) by mouth daily. 90 tablet 2   clonazePAM (KLONOPIN) 0.5 MG tablet Take 0.5 mg by mouth 3 (three) times daily as needed.     gabapentin (NEURONTIN) 100 MG capsule Take 1 capsule (100 mg total) by mouth at bedtime. 30 capsule 2   glimepiride (AMARYL) 2 MG tablet Take 1 tablet (2 mg total) by mouth daily before breakfast. 30 tablet 3   metFORMIN (GLUCOPHAGE) 1000 MG tablet Take 1 tablet (1,000 mg total) by mouth 2 (two) times daily with a meal. 180 tablet 3   naproxen (NAPROSYN) 500 MG tablet TAKE 1 TABLET BY MOUTH TWICE DAILY WITH A MEAL 30 tablet 0   pantoprazole (PROTONIX) 40 MG tablet Take 1 tablet (40 mg total) by mouth as needed. 30 tablet 2   valACYclovir (VALTREX) 500 MG tablet Take 500 mg by mouth 1 day or 1 dose.     Vitamin D, Ergocalciferol, (DRISDOL) 1.25 MG (50000 UNIT) CAPS capsule Take 1 capsule (50,000 Units total) by mouth every 7 (seven) days. 20 capsule 1   No facility-administered medications prior to visit.    Allergies  Allergen Reactions   Fenoprofen Calcium     Review of Systems  Constitutional:  Negative for chills and fever.  HENT:  Negative for congestion, sinus pressure, sinus pain and sore throat.   Eyes:  Negative for pain and discharge.  Respiratory:  Negative for cough and shortness of breath.   Cardiovascular:  Negative for chest pain and palpitations.  Gastrointestinal:  Positive for nausea. Negative for abdominal pain, diarrhea and vomiting.  Endocrine: Negative for polydipsia and polyuria.  Genitourinary:  Positive for dysuria and frequency. Negative for hematuria.  Musculoskeletal:  Negative for neck pain and neck stiffness.  Skin:  Positive for rash.  Neurological:  Positive for headaches. Negative for dizziness and weakness.   Psychiatric/Behavioral:  Negative for agitation and behavioral problems.        Objective:    Physical Exam Vitals reviewed.  Constitutional:      General: She is not in acute distress.    Appearance: She is not diaphoretic.  HENT:     Head: Normocephalic and atraumatic.     Mouth/Throat:     Mouth: Mucous membranes are dry.  Eyes:     General:  No scleral icterus.    Extraocular Movements: Extraocular movements intact.  Cardiovascular:     Rate and Rhythm: Normal rate and regular rhythm.     Heart sounds: Normal heart sounds. No murmur heard. Pulmonary:     Breath sounds: Normal breath sounds. No wheezing or rales.  Abdominal:     Palpations: Abdomen is soft.     Tenderness: There is abdominal tenderness (Mild, suprapubic).  Musculoskeletal:     Cervical back: Neck supple. No tenderness.     Right lower leg: No edema.     Left lower leg: No edema.  Skin:    General: Skin is warm.     Findings: Rash (Erythematous patches over b/l and upper chest wall area) present.  Neurological:     General: No focal deficit present.     Mental Status: She is alert and oriented to person, place, and time.  Psychiatric:        Mood and Affect: Mood normal.        Behavior: Behavior normal.     BP 136/88 (BP Location: Right Arm)   Pulse 74   Ht 5\' 3"  (1.6 m)   Wt (!) 354 lb 9.6 oz (160.8 kg)   LMP 08/30/2016   SpO2 96%   BMI 62.81 kg/m  Wt Readings from Last 3 Encounters:  11/29/22 (!) 354 lb 9.6 oz (160.8 kg)  10/31/22 (!) 349 lb 1.9 oz (158.4 kg)  10/03/22 (!) 349 lb 1.3 oz (158.3 kg)        Assessment & Plan:   Problem List Items Addressed This Visit       Musculoskeletal and Integument   Sunburn    Sunburn related rash Calamine lotion as needed Needs to apply sunscreen regularly before outdoor activities       Relevant Medications   calamine lotion     Genitourinary   Acute cystitis without hematuria - Primary    UA reviewed Started empiric  Bactrim Check urine culture Advised to maintain adequate hydration      Relevant Medications   sulfamethoxazole-trimethoprim (BACTRIM DS) 800-160 MG tablet   Other Relevant Orders   Urine Culture   POCT URINALYSIS DIP (CLINITEK) (Completed)     Meds ordered this encounter  Medications   sulfamethoxazole-trimethoprim (BACTRIM DS) 800-160 MG tablet    Sig: Take 1 tablet by mouth 2 (two) times daily.    Dispense:  10 tablet    Refill:  0   calamine lotion    Sig: Apply 1 Application topically as needed for itching.    Dispense:  177 mL    Refill:  0     Thurman Sarver Concha Se, MD

## 2022-11-30 DIAGNOSIS — L559 Sunburn, unspecified: Secondary | ICD-10-CM | POA: Insufficient documentation

## 2022-11-30 NOTE — Assessment & Plan Note (Signed)
Sunburn related rash Calamine lotion as needed Needs to apply sunscreen regularly before outdoor activities

## 2022-11-30 NOTE — Assessment & Plan Note (Addendum)
UA reviewed Started empiric Bactrim Check urine culture Advised to maintain adequate hydration

## 2023-01-13 ENCOUNTER — Ambulatory Visit (INDEPENDENT_AMBULATORY_CARE_PROVIDER_SITE_OTHER): Payer: 59

## 2023-01-13 DIAGNOSIS — E1169 Type 2 diabetes mellitus with other specified complication: Secondary | ICD-10-CM

## 2023-01-13 DIAGNOSIS — E119 Type 2 diabetes mellitus without complications: Secondary | ICD-10-CM

## 2023-01-13 LAB — HM DIABETES EYE EXAM

## 2023-01-13 NOTE — Progress Notes (Signed)
Norma Horton arrived 01/13/2023 and has given verbal consent to obtain images and complete their overdue diabetic retinal screening.  The images have been sent to an ophthalmologist or optometrist for review and interpretation.  Results will be sent back to Gilmore Laroche, FNP for review.  Patient has been informed they will be contacted when we receive the results via telephone or MyChart

## 2023-01-22 ENCOUNTER — Other Ambulatory Visit: Payer: Self-pay | Admitting: Family Medicine

## 2023-01-22 DIAGNOSIS — J4540 Moderate persistent asthma, uncomplicated: Secondary | ICD-10-CM

## 2023-01-23 ENCOUNTER — Encounter: Payer: Self-pay | Admitting: Family Medicine

## 2023-01-23 ENCOUNTER — Ambulatory Visit: Payer: 59 | Admitting: Internal Medicine

## 2023-01-26 ENCOUNTER — Encounter: Payer: Self-pay | Admitting: Family Medicine

## 2023-01-31 ENCOUNTER — Ambulatory Visit (INDEPENDENT_AMBULATORY_CARE_PROVIDER_SITE_OTHER): Payer: 59 | Admitting: Family Medicine

## 2023-01-31 ENCOUNTER — Encounter: Payer: Self-pay | Admitting: Family Medicine

## 2023-01-31 VITALS — BP 124/82 | HR 87 | Ht 63.0 in | Wt 354.1 lb

## 2023-01-31 DIAGNOSIS — E038 Other specified hypothyroidism: Secondary | ICD-10-CM

## 2023-01-31 DIAGNOSIS — I10 Essential (primary) hypertension: Secondary | ICD-10-CM | POA: Diagnosis not present

## 2023-01-31 DIAGNOSIS — E559 Vitamin D deficiency, unspecified: Secondary | ICD-10-CM | POA: Diagnosis not present

## 2023-01-31 DIAGNOSIS — E119 Type 2 diabetes mellitus without complications: Secondary | ICD-10-CM

## 2023-01-31 DIAGNOSIS — Z7984 Long term (current) use of oral hypoglycemic drugs: Secondary | ICD-10-CM

## 2023-01-31 DIAGNOSIS — E7849 Other hyperlipidemia: Secondary | ICD-10-CM

## 2023-01-31 NOTE — Progress Notes (Signed)
Established Patient Office Visit  Subjective:  Patient ID: Norma Horton, female    DOB: April 04, 1971  Age: 52 y.o. MRN: [id number]  CC:  Chief Complaint  Patient presents with   Care Management    3 month f/u    HPI Norma Horton is a 52 y.o. female with past medical history of hypertension, type 2 diabetes, and vitamin D deficiency presents for f/u of  chronic medical conditions.  Past Medical History:  Diagnosis Date   Anxiety    Depression    Pt has had suicide ideations in the past.   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    Past Surgical History:  Procedure Laterality Date   CHOLECYSTECTOMY     TUBAL LIGATION      Family History  Problem Relation Age of Onset   Breast cancer Maternal Grandmother    Heart attack Father    Hypertension Father    COPD Mother    Diabetes Mellitus II Brother    Throat cancer Brother    Cerebral aneurysm Sister    Cervical cancer Sister    Cervical cancer Sister    Heart attack Sister    Diabetes Daughter    Asthma Daughter    Cystic fibrosis Daughter    Hypertension Other     Social History   Socioeconomic History   Marital status: Married    Spouse name: Not on file   Number of children: 4   Years of education: Not on file   Highest education level: Not on file  Occupational History   Not on file  Tobacco Use   Smoking status: Never   Smokeless tobacco: Never  Vaping Use   Vaping status: Never Used  Substance and Sexual Activity   Alcohol use: Yes    Comment: occ   Drug use: No   Sexual activity: Yes    Birth control/protection: Surgical, Post-menopausal    Comment: tubal  Other Topics Concern   Not on file  Social History Narrative   Lives with her husband and her children    Social Determinants of Health   Financial Resource Strain: Medium Risk (02/03/2022)   Overall Financial Resource Strain (CARDIA)    Difficulty of Paying Living Expenses:  Somewhat hard  Food Insecurity: No Food Insecurity (02/03/2022)   Hunger Vital Sign    Worried About Running Out of Food in the Last Year: Never true    Ran Out of Food in the Last Year: Never true  Transportation Needs: No Transportation Needs (02/03/2022)   PRAPARE - Administrator, Civil Service (Medical): No    Lack of Transportation (Non-Medical): No  Physical Activity: Inactive (02/03/2022)   Exercise Vital Sign    Days of Exercise per Week: 0 days    Minutes of Exercise per Session: 0 min  Stress: Stress Concern Present (02/03/2022)   [name]    Feeling of Stress : Very much  Social Connections: Moderately Isolated (02/03/2022)   Social Connection and Isolation Panel [NHANES]    Frequency of Communication with Friends and Family: More than three times a week    Frequency of Social Gatherings with Friends and Family: Twice a week    Attends Religious Services: Never    Database administrator or Organizations: No    Attends Banker Meetings: Never  Marital Status: Married  Catering manager Violence: Not At Risk (02/03/2022)   Humiliation, Afraid, Rape, and Kick questionnaire    Fear of Current or Ex-Partner: No    Emotionally Abused: No    Physically Abused: No    Sexually Abused: No    Outpatient Medications Prior to Visit  Medication Sig Dispense Refill   albuterol (VENTOLIN HFA) 108 (90 Base) MCG/ACT inhaler INHALE 2 PUFFS BY MOUTH EVERY 6 HOURS AS NEEDED FOR WHEEZING AND FOR SHORTNESS OF BREATH 7 each 0   BREYNA 80-4.5 MCG/ACT inhaler Inhale 2 puffs by mouth twice daily 11 g 0   budesonide (RHINOCORT ALLERGY) 32 MCG/ACT nasal spray Place 1 spray into both nostrils 2 (two) times daily. 16 mL 1   calamine lotion Apply 1 Application topically as needed for itching. 177 mL 0   chlorthalidone (HYGROTON) 25 MG tablet Take 1 tablet (25 mg total) by mouth daily. 90 tablet 2   clonazePAM  (KLONOPIN) 0.5 MG tablet Take 0.5 mg by mouth 3 (three) times daily as needed.     gabapentin (NEURONTIN) 100 MG capsule Take 1 capsule (100 mg total) by mouth at bedtime. 30 capsule 2   glimepiride (AMARYL) 2 MG tablet Take 1 tablet (2 mg total) by mouth daily before breakfast. 30 tablet 3   metFORMIN (GLUCOPHAGE) 1000 MG tablet Take 1 tablet (1,000 mg total) by mouth 2 (two) times daily with a meal. 180 tablet 3   naproxen (NAPROSYN) 500 MG tablet TAKE 1 TABLET BY MOUTH TWICE DAILY WITH A MEAL 30 tablet 0   pantoprazole (PROTONIX) 40 MG tablet Take 1 tablet (40 mg total) by mouth as needed. 30 tablet 2   sulfamethoxazole-trimethoprim (BACTRIM DS) 800-160 MG tablet Take 1 tablet by mouth 2 (two) times daily. 10 tablet 0   valACYclovir (VALTREX) 500 MG tablet Take 500 mg by mouth 1 day or 1 dose.     Vitamin D, Ergocalciferol, (DRISDOL) 1.25 MG (50000 UNIT) CAPS capsule Take 1 capsule (50,000 Units total) by mouth every 7 (seven) days. 20 capsule 1   No facility-administered medications prior to visit.    Allergies  Allergen Reactions   Fenoprofen Calcium     ROS Review of Systems  Constitutional:  Negative for chills and fever.  Eyes:  Negative for visual disturbance.  Respiratory:  Negative for chest tightness and shortness of breath.   Neurological:  Negative for dizziness and headaches.      Objective:    Physical Exam HENT:     Head: Normocephalic.     Mouth/Throat:     Mouth: Mucous membranes are moist.  Cardiovascular:     Rate and Rhythm: Normal rate.     Heart sounds: Normal heart sounds.  Pulmonary:     Effort: Pulmonary effort is normal.     Breath sounds: Normal breath sounds.  Neurological:     Mental Status: She is alert.     BP 124/82   Pulse 87   Ht 5\' 3"  (1.6 m)   Wt (!) 354 lb 1.9 oz (160.6 kg)   LMP 08/30/2016   SpO2 96%   BMI 62.73 kg/m  Wt Readings from Last 3 Encounters:  01/31/23 (!) 354 lb 1.9 oz (160.6 kg)  11/29/22 (!) 354 lb 9.6 oz  (160.8 kg)  10/31/22 (!) 349 lb 1.9 oz (158.4 kg)    Lab Results  Component Value Date   TSH 2.920 10/31/2022   Lab Results  Component Value Date   WBC 8.8  10/31/2022   HGB 15.8 10/31/2022   HCT 47.9 (H) 10/31/2022   MCV 93 10/31/2022   PLT 349 10/31/2022   Lab Results  Component Value Date   NA 143 10/31/2022   K 4.1 10/31/2022   CO2 23 10/31/2022   GLUCOSE 131 (H) 10/31/2022   BUN 11 10/31/2022   CREATININE 0.84 10/31/2022   BILITOT 0.9 10/31/2022   ALKPHOS 92 10/31/2022   AST 33 10/31/2022   ALT 37 (H) 10/31/2022   PROT 7.3 10/31/2022   ALBUMIN 4.3 10/31/2022   CALCIUM 10.0 10/31/2022   EGFR 83 10/31/2022   Lab Results  Component Value Date   CHOL 183 10/31/2022   Lab Results  Component Value Date   HDL 57 10/31/2022   Lab Results  Component Value Date   LDLCALC 105 (H) 10/31/2022   Lab Results  Component Value Date   TRIG 118 10/31/2022   Lab Results  Component Value Date   CHOLHDL 3.2 10/31/2022   Lab Results  Component Value Date   HGBA1C 7.0 (H) 10/31/2022      Assessment & Plan:  Type 2 diabetes mellitus without complication, without long-term current use of insulin (HCC) Assessment & Plan: Denies polyuria, polyphagia, polydipsia She takes metformin at 1000 mg twice daily and glimepiride 2 mg daily Recommend avoiding simple carbohydrates, including cakes, sweet desserts, ice cream, soda (diet or regular), sweet tea, candies, chips, cookies, store-bought juices, alcohol in excess of 1-2 drinks a day, lemonade, artificial sweeteners, donuts, coffee creamers, and sugar-free products.  I recommend avoiding greasy, fatty foods with increased physical activity.  Lab Results  Component Value Date   HGBA1C 7.0 (H) 10/31/2022     Orders: -     Microalbumin / creatinine urine ratio -     HM Diabetes Foot Exam -     Hemoglobin A1c  Essential hypertension, benign Assessment & Plan: Controlled She takes chlorthalidone 25 mg daily She reports  compliance with treatment regimen  Low sodium diet with increase physical activity encouraged BP Readings from Last 3 Encounters:  01/31/23 124/82  11/29/22 136/88  10/31/22 138/86      Vitamin D deficiency Assessment & Plan: Denies muscle and bone pain, fatigue, and frequent infection Encouraged to continue taking her weekly vitamin D supplement Will assess her vitamin D levels today Last vitamin D Lab Results  Component Value Date   VD25OH 28.9 (L) 10/31/2022     Orders: -     VITAMIN D 25 Hydroxy (Vit-D Deficiency, Fractures)  Other specified hypothyroidism -     TSH + free T4  Other hyperlipidemia -     Lipid panel -     CMP14+EGFR -     CBC with Differential/Platelet  Note: This chart has been completed using Engineer, civil (consulting) software, and while attempts have been made to ensure accuracy, certain words and phrases may not be transcribed as intended.    Follow-up: Return in about 4 months (around 06/03/2023).   Gilmore Laroche, FNP

## 2023-01-31 NOTE — Assessment & Plan Note (Signed)
Controlled She takes chlorthalidone 25 mg daily She reports compliance with treatment regimen  Low sodium diet with increase physical activity encouraged BP Readings from Last 3 Encounters:  01/31/23 124/82  11/29/22 136/88  10/31/22 138/86

## 2023-01-31 NOTE — Assessment & Plan Note (Addendum)
Denies muscle and bone pain, fatigue, and frequent infection Encouraged to continue taking her weekly vitamin D supplement Will assess her vitamin D levels today Last vitamin D Lab Results  Component Value Date   VD25OH 28.9 (L) 10/31/2022

## 2023-01-31 NOTE — Patient Instructions (Signed)
I appreciate the opportunity to provide care to you today!    Follow up:  4 months  Labs: please stop by the lab during the week to get your blood drawn (CBC, CMP, TSH, Lipid profile, HgA1c, Vit D)  Here are some foods to avoid or reduce in your diet to help manage cholesterol levels:  Fried Foods:Deep-fried items such as french fries, fried chicken, and fried snacks are high in unhealthy fats and can raise LDL (bad) cholesterol levels. Processed Meats:Foods like bacon, sausage, hot dogs, and deli meats are often high in saturated fat and cholesterol. Full-Fat Dairy Products:Whole milk, full-fat yogurt, butter, cream, and cheese are rich in saturated fats, which can increase cholesterol levels. Baked Goods and Sweets:Pastries, cakes, cookies, and donuts often contain trans fats and added sugars, which can raise LDL cholesterol and lower HDL (good) cholesterol. Red Meat:Beef, lamb, and pork are high in saturated fat. Lean cuts or plant-based protein alternatives are better options. Lard and Shortening:Used in some baked goods, lard and shortening are high in trans fats and should be avoided. Fast Food:Many fast food items are cooked with unhealthy oils and contain high amounts of saturated and trans fats. Processed Snacks:Chips, crackers, and certain microwave popcorns can contain trans fats and high levels of unhealthy oils. Shellfish:While nutritious in other ways, some shellfish like shrimp, lobster, and crab are high in cholesterol. They should be consumed in moderation. Coconut and Palm Oils:these oils are high in saturated fat and can raise cholesterol levels when used in cooking or baking.      Please continue to a heart-healthy diet and increase your physical activities. Try to exercise for at least five days a week.    It was a pleasure to see you and I look forward to continuing to work together on your health and well-being. Please do not hesitate to call the office if you  need care or have questions about your care.  In case of emergency, please visit the Emergency Department for urgent care, or contact our clinic at [phone] to schedule an appointment. We're here to help you!   Have a wonderful day and week. With Gratitude, Gilmore Laroche MSN, FNP-BC

## 2023-01-31 NOTE — Assessment & Plan Note (Signed)
Denies polyuria, polyphagia, polydipsia She takes metformin at 1000 mg twice daily and glimepiride 2 mg daily Recommend avoiding simple carbohydrates, including cakes, sweet desserts, ice cream, soda (diet or regular), sweet tea, candies, chips, cookies, store-bought juices, alcohol in excess of 1-2 drinks a day, lemonade, artificial sweeteners, donuts, coffee creamers, and sugar-free products.  I recommend avoiding greasy, fatty foods with increased physical activity.  Lab Results  Component Value Date   HGBA1C 7.0 (H) 10/31/2022

## 2023-02-09 ENCOUNTER — Telehealth (HOSPITAL_COMMUNITY): Payer: Self-pay

## 2023-02-09 NOTE — Telephone Encounter (Signed)
02/13/23 appt confirmed by pt

## 2023-02-13 ENCOUNTER — Ambulatory Visit (HOSPITAL_COMMUNITY): Payer: 59 | Admitting: Psychiatry

## 2023-03-03 ENCOUNTER — Telehealth: Payer: Self-pay | Admitting: Family Medicine

## 2023-03-03 ENCOUNTER — Telehealth: Payer: Self-pay | Admitting: Professional Counselor

## 2023-03-03 HISTORY — PX: BIOPSY THYROID: PRO38

## 2023-03-03 NOTE — Telephone Encounter (Signed)
Patient has not followed up for collaborative care in 3 months. We will sign off.

## 2023-03-03 NOTE — Telephone Encounter (Signed)
FMLA  Noted Copied Sleeved (put in provider box)  Fax to [phone] and Call patient when ready

## 2023-03-04 LAB — TSH: TSH: 3.28 u[IU]/mL (ref 0.450–4.500)

## 2023-03-04 LAB — T4, FREE: Free T4: 1.17 ng/dL (ref 0.82–1.77)

## 2023-03-04 LAB — VITAMIN D 25 HYDROXY (VIT D DEFICIENCY, FRACTURES): Vit D, 25-Hydroxy: 20.8 ng/mL — ABNORMAL LOW (ref 30.0–100.0)

## 2023-03-04 LAB — T3, FREE: T3, Free: 2.9 pg/mL (ref 2.0–4.4)

## 2023-03-06 ENCOUNTER — Ambulatory Visit (INDEPENDENT_AMBULATORY_CARE_PROVIDER_SITE_OTHER): Payer: 59 | Admitting: "Endocrinology

## 2023-03-06 ENCOUNTER — Encounter: Payer: Self-pay | Admitting: "Endocrinology

## 2023-03-06 VITALS — BP 136/84 | HR 64 | Ht 63.0 in | Wt 357.0 lb

## 2023-03-06 DIAGNOSIS — E119 Type 2 diabetes mellitus without complications: Secondary | ICD-10-CM | POA: Diagnosis not present

## 2023-03-06 DIAGNOSIS — E559 Vitamin D deficiency, unspecified: Secondary | ICD-10-CM

## 2023-03-06 DIAGNOSIS — I10 Essential (primary) hypertension: Secondary | ICD-10-CM | POA: Diagnosis not present

## 2023-03-06 DIAGNOSIS — Z7984 Long term (current) use of oral hypoglycemic drugs: Secondary | ICD-10-CM

## 2023-03-06 DIAGNOSIS — E049 Nontoxic goiter, unspecified: Secondary | ICD-10-CM | POA: Diagnosis not present

## 2023-03-06 DIAGNOSIS — Z6841 Body Mass Index (BMI) 40.0 and over, adult: Secondary | ICD-10-CM

## 2023-03-06 LAB — CBC WITH DIFFERENTIAL/PLATELET
Basophils Absolute: 0.1 10*3/uL (ref 0.0–0.2)
Basos: 1 %
EOS (ABSOLUTE): 0.3 10*3/uL (ref 0.0–0.4)
Eos: 4 %
Hematocrit: 44.6 % (ref 34.0–46.6)
Hemoglobin: 14.9 g/dL (ref 11.1–15.9)
Immature Grans (Abs): 0 10*3/uL (ref 0.0–0.1)
Immature Granulocytes: 0 %
Lymphocytes Absolute: 2.8 10*3/uL (ref 0.7–3.1)
Lymphs: 32 %
MCH: 31.3 pg (ref 26.6–33.0)
MCHC: 33.4 g/dL (ref 31.5–35.7)
MCV: 94 fL (ref 79–97)
Monocytes Absolute: 0.5 10*3/uL (ref 0.1–0.9)
Monocytes: 6 %
Neutrophils Absolute: 5 10*3/uL (ref 1.4–7.0)
Neutrophils: 57 %
Platelets: 269 10*3/uL (ref 150–450)
RBC: 4.76 x10E6/uL (ref 3.77–5.28)
RDW: 13 % (ref 11.7–15.4)
WBC: 8.7 10*3/uL (ref 3.4–10.8)

## 2023-03-06 LAB — CMP14+EGFR
ALT: 36 [IU]/L — ABNORMAL HIGH (ref 0–32)
AST: 25 [IU]/L (ref 0–40)
Albumin: 4.1 g/dL (ref 3.8–4.9)
Alkaline Phosphatase: 97 [IU]/L (ref 44–121)
BUN/Creatinine Ratio: 21 (ref 9–23)
BUN: 14 mg/dL (ref 6–24)
Bilirubin Total: 0.5 mg/dL (ref 0.0–1.2)
CO2: 24 mmol/L (ref 20–29)
Calcium: 9.5 mg/dL (ref 8.7–10.2)
Chloride: 101 mmol/L (ref 96–106)
Creatinine, Ser: 0.66 mg/dL (ref 0.57–1.00)
Globulin, Total: 3.2 g/dL (ref 1.5–4.5)
Glucose: 118 mg/dL — ABNORMAL HIGH (ref 70–99)
Potassium: 4.6 mmol/L (ref 3.5–5.2)
Sodium: 138 mmol/L (ref 134–144)
Total Protein: 7.3 g/dL (ref 6.0–8.5)
eGFR: 104 mL/min/{1.73_m2} (ref >59)

## 2023-03-06 LAB — LIPID PANEL
Chol/HDL Ratio: 2.8 {ratio} (ref 0.0–4.4)
Cholesterol, Total: 162 mg/dL (ref 100–199)
HDL: 57 mg/dL (ref >39)
LDL Chol Calc (NIH): 84 mg/dL (ref 0–99)
Triglycerides: 119 mg/dL (ref 0–149)
VLDL Cholesterol Cal: 21 mg/dL (ref 5–40)

## 2023-03-06 LAB — VITAMIN D 25 HYDROXY (VIT D DEFICIENCY, FRACTURES): Vit D, 25-Hydroxy: 23.4 ng/mL — ABNORMAL LOW (ref 30.0–100.0)

## 2023-03-06 LAB — HEMOGLOBIN A1C
Est. average glucose Bld gHb Est-mCnc: 157 mg/dL
Hgb A1c MFr Bld: 7.1 % — ABNORMAL HIGH (ref 4.8–5.6)

## 2023-03-06 LAB — TSH+FREE T4
Free T4: 1.16 ng/dL (ref 0.82–1.77)
TSH: 3.39 u[IU]/mL (ref 0.450–4.500)

## 2023-03-06 LAB — MICROALBUMIN / CREATININE URINE RATIO
Creatinine, Urine: 80 mg/dL
Microalb/Creat Ratio: 4 mg/g{creat} (ref 0–29)
Microalbumin, Urine: 3.4 ug/mL

## 2023-03-06 MED ORDER — RYBELSUS 3 MG PO TABS: 3.0000 mg | ORAL_TABLET | Freq: Every day | ORAL | 1 refills | Status: DC

## 2023-03-06 NOTE — Progress Notes (Signed)
Oley Balm, MD [facility] PROCEDURE / BIOPSY REVIEW Date: 03/06/23  Requested Biopsy site: R nodule Reason for request: previous bx [facility], some growth since 2017 Imaging review: Best seen on [facility] 11/17/22  Decision: Approved Imaging modality to perform: Ultrasound Schedule with: No sedation / Local anesthetic Schedule for: Any VIR  Additional comments:   Please contact me with questions, concerns, or if issue pertaining to this request arise.  Dayne Oley Balm, MD [facility]

## 2023-03-06 NOTE — Progress Notes (Signed)
03/06/2023, 4:40 PM   Endocrinology follow-up note  Subjective:    Patient ID: Norma Horton, female    DOB: 12-Jul-1970, PCP Gilmore Laroche, FNP   Past Medical History:  Diagnosis Date   Anxiety    Depression    Pt has had suicide ideations in the past.   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   Past Surgical History:  Procedure Laterality Date   CHOLECYSTECTOMY     TUBAL LIGATION     Social History   Socioeconomic History   Marital status: Married    Spouse name: Not on file   Number of children: 4   Years of education: Not on file   Highest education level: Not on file  Occupational History   Not on file  Tobacco Use   Smoking status: Never   Smokeless tobacco: Never  Vaping Use   Vaping status: Never Used  Substance and Sexual Activity   Alcohol use: Yes    Comment: occ   Drug use: No   Sexual activity: Yes    Birth control/protection: Surgical, Post-menopausal    Comment: tubal  Other Topics Concern   Not on file  Social History Narrative   Lives with her husband and her children    Social Determinants of Health   Financial Resource Strain: Medium Risk (02/03/2022)   Overall Financial Resource Strain (CARDIA)    Difficulty of Paying Living Expenses: Somewhat hard  Food Insecurity: No Food Insecurity (02/03/2022)   Hunger Vital Sign    Worried About Running Out of Food in the Last Year: Never true    Ran Out of Food in the Last Year: Never true  Transportation Needs: No Transportation Needs (02/03/2022)   PRAPARE - Administrator, Civil Service (Medical): No    Lack of Transportation (Non-Medical): No  Physical Activity: Inactive (02/03/2022)   Exercise Vital Sign    Days of Exercise per Week: 0 days    Minutes of Exercise per Session: 0 min  Stress: Stress Concern Present (02/03/2022)   [name] of Occupational Health -  Occupational Stress Questionnaire    Feeling of Stress : Very much  Social Connections: Moderately Isolated (02/03/2022)   Social Connection and Isolation Panel [NHANES]    Frequency of Communication with Friends and Family: More than three times a week    Frequency of Social Gatherings with Friends and Family: Twice a week    Attends Religious Services: Never    Database administrator or Organizations: No    Attends Engineer, structural: Never    Marital Status: Married   Family History  Problem Relation Age of Onset   Breast cancer Maternal Grandmother    Heart attack Father    Hypertension Father    COPD Mother    Diabetes Mellitus II Brother    Throat cancer Brother    Cerebral aneurysm Sister    Cervical cancer Sister    Cervical cancer Sister    Heart attack Sister    Diabetes Daughter    Asthma Daughter    Cystic fibrosis Daughter  Hypertension Other    Outpatient Encounter Medications as of 03/06/2023  Medication Sig   Semaglutide (RYBELSUS) 3 MG TABS Take 1 tablet (3 mg total) by mouth daily.   albuterol (VENTOLIN HFA) 108 (90 Base) MCG/ACT inhaler INHALE 2 PUFFS BY MOUTH EVERY 6 HOURS AS NEEDED FOR WHEEZING AND FOR SHORTNESS OF BREATH   BREYNA 80-4.5 MCG/ACT inhaler Inhale 2 puffs by mouth twice daily   budesonide (RHINOCORT ALLERGY) 32 MCG/ACT nasal spray Place 1 spray into both nostrils 2 (two) times daily.   calamine lotion Apply 1 Application topically as needed for itching.   chlorthalidone (HYGROTON) 25 MG tablet Take 1 tablet (25 mg total) by mouth daily.   clonazePAM (KLONOPIN) 0.5 MG tablet Take 0.5 mg by mouth 3 (three) times daily as needed.   gabapentin (NEURONTIN) 100 MG capsule Take 1 capsule (100 mg total) by mouth at bedtime.   glimepiride (AMARYL) 2 MG tablet Take 1 tablet (2 mg total) by mouth daily before breakfast.   metFORMIN (GLUCOPHAGE) 1000 MG tablet Take 1 tablet (1,000 mg total) by mouth 2 (two) times daily with a meal.   naproxen  (NAPROSYN) 500 MG tablet TAKE 1 TABLET BY MOUTH TWICE DAILY WITH A MEAL   pantoprazole (PROTONIX) 40 MG tablet Take 1 tablet (40 mg total) by mouth as needed.   sulfamethoxazole-trimethoprim (BACTRIM DS) 800-160 MG tablet Take 1 tablet by mouth 2 (two) times daily.   valACYclovir (VALTREX) 500 MG tablet Take 500 mg by mouth 1 day or 1 dose.   Vitamin D, Ergocalciferol, (DRISDOL) 1.25 MG (50000 UNIT) CAPS capsule Take 1 capsule (50,000 Units total) by mouth every 7 (seven) days.   No facility-administered encounter medications on file as of 03/06/2023.   ALLERGIES: Allergies  Allergen Reactions   Fenoprofen Calcium     VACCINATION STATUS: Immunization History  Administered Date(s) Administered   Tdap 05/23/1998, 11/16/2021   Zoster Recombinant(Shingrix) 01/21/2022, 04/22/2022    HPI Norma Horton is 52 y.o. female who presents today with a medical history as above. she is being seen in follow-up after she was seen in consultation for multinodular goiter requested by Gilmore Laroche, FNP.  See notes from previous visit.  She has history of multinodular goiter dating back to 2017.   She did have biopsy of a  right lobe nodule where results were reported to be atypia of undetermined significance.  Her recent repeat thyroid ultrasound in July 2024  did not show significant change in the size of the nodule-6.4 x 4.7 x 3 point diarrhea.  Patient denies dysphagia, voice change, nor shortness of breath at this time.     She has no family history of thyroid dysfunction or thyroid malignancy.  She continues to have euthyroid state on thyroid function tests. Her other medical problems include type 2 diabetes on metformin 1000 mg p.o. twice daily.  Her recent A1c was 7.1%.  She has hypertension on treatment with hydrochlorothiazide.   Review of Systems  Constitutional:  No recent weight change, no fatigue, no subjective hyperthermia, no subjective hypothermia Eyes: no blurry vision, no  xerophthalmia ENT: no sore throat, no nodules palpated in throat, no dysphagia/odynophagia, no hoarseness   Objective:       03/06/2023   10:10 AM 01/31/2023    9:40 AM 11/29/2022    2:10 PM  Vitals with BMI  Height 5\' 3"  5\' 3"    Weight 357 lbs 354 lbs 2 oz   BMI 63.26 62.75   Systolic 136 124 962  Diastolic 84 82 88  Pulse 64 87     BP 136/84   Pulse 64   Ht 5\' 3"  (1.6 m)   Wt (!) 357 lb (161.9 kg)   LMP 08/30/2016   BMI 63.24 kg/m   Wt Readings from Last 3 Encounters:  03/06/23 (!) 357 lb (161.9 kg)  01/31/23 (!) 354 lb 1.9 oz (160.6 kg)  11/29/22 (!) 354 lb 9.6 oz (160.8 kg)    Physical Exam  Constitutional:  Body mass index is 63.24 kg/m.,  not in acute distress, normal state of mind Eyes: PERRLA, EOMI, no exophthalmos ENT: moist mucous membranes, + gross thyromegaly, no gross cervical lymphadenopathy   CMP ( most recent) CMP     Component Value Date/Time   NA 138 03/03/2023 0915   K 4.6 03/03/2023 0915   CL 101 03/03/2023 0915   CO2 24 03/03/2023 0915   GLUCOSE 118 (H) 03/03/2023 0915   GLUCOSE 135 (H) 11/06/2010 2348   BUN 14 03/03/2023 0915   CREATININE 0.66 03/03/2023 0915   CALCIUM 9.5 03/03/2023 0915   PROT 7.3 03/03/2023 0915   ALBUMIN 4.1 03/03/2023 0915   AST 25 03/03/2023 0915   ALT 36 (H) 03/03/2023 0915   ALKPHOS 97 03/03/2023 0915   BILITOT 0.5 03/03/2023 0915   GFRNONAA >60 11/06/2010 2348   GFRAA >60 11/06/2010 2348    Thyroid ultrasound on November 19, 2015 FINDINGS: Right thyroid lobe   Measurements: 6.8 x 3.8 x 4.5 cm. Right upper pole predominately solid nodule measures 4.9 x 3.7 x 4.5 cm   Left thyroid lobe : Measurements: 4.9 x 1.5 x 1.4 cm. Heterogeneous tissue without focal  nodule.   Isthmus : Thickness: 0.3 cm.  No nodules visualized.   Lymphadenopathy: None visualized.   IMPRESSION: Solitary large right lobe nodule measures 4.9 cm. Findings meet consensus criteria for biopsy. Ultrasound-guided fine needle aspiration  should be considered, as per the consensus statement: Management of Thyroid Nodules Detected at [place]: Society of Radiologists in Ultrasound Consensus Conference Statement. Radiology 2005; [id number].  Final needle aspiration biopsy of thyroid nodules in August 2017 COMMENT: THE SPECIMEN IS HYPERCELLULAR AND CONSISTS OF SMALL AND MEDIUM SIZED GROUPS OF FOLLICULAR  EPITHELIAL CELLS WITH CYTOLOGIC ATYPIA, INCLUDING NUCLEAR ENLARGEMENT AND OVERLAP.    Patient did not want surgery.  Repeat thyroid ultrasound was performed on January 21, 2022 showing   Isthmus: 0.3 cm ,previously 0.3 cm   Right lobe: 7.7 x 3.9 x 4.7 cm ,previously 6.8 x 3.8 x 4.5 cm,    Left lobe: 5.2 x 1.1 x 1.5 cm ,previously 4.9 x 1.5 x 1.4 cm  Nodule in the right lobe of her thyroid size: 6.4 cm; Other 2 dimensions: 4.8 x 3.8 cm, previously,  4.9 x 3.7 x 4.5 cm  Diabetic Labs (most recent): Lab Results  Component Value Date   HGBA1C 7.1 (H) 03/03/2023   HGBA1C 7.0 (H) 10/31/2022   HGBA1C 7.3 (H) 07/25/2022     Lab Results  Component Value Date   TSH 3.280 03/03/2023   TSH 3.390 03/03/2023   TSH 2.920 10/31/2022   TSH 3.640 08/10/2022   TSH 2.300 07/25/2022   TSH 2.750 04/22/2022   TSH 3.900 01/21/2022   TSH 2.660 11/16/2021   FREET4 1.17 03/03/2023   FREET4 1.16 03/03/2023   FREET4 1.15 10/31/2022   FREET4 1.22 08/10/2022   FREET4 1.28 07/25/2022   FREET4 1.21 04/22/2022   FREET4 1.15 01/21/2022   FREET4 1.14 11/16/2021  Assessment & Plan:   1.  Euthyroid nodular goiter 2.  Morbid obesity   3.  Hypertension   4.  Type 2 diabetes  5. Vitamin D deficiency   - Based on these reviews, she has history of nodular goiter, growing overall, and growing nodule on the right lobe.  This nodule was previously biopsied with bethesda 3 findings in 2017.  After she was approached for a repeat biopsy of a 6.4 cm nodule in July 2024, she opted to avoid it.  She is reapproached due to the fact that she reports  occasional compressive symptoms.  She did not have Magiera steady during her previous biopsy in 2017. She seems to be open for repeat biopsy at this time.  This procedure will be scheduled for her to do  at [facility] radiology.      In light of her euthyroid presentation, she will not need intervention with thyroid hormone or antithyroid medications.    -For her diabetes type 2 , hypertension, she is advised to continue on her current medications as well as follow-up with her PCP.  She is advised to continue metformin 1000 mg p.o. twice daily.  In light of her obesity with BMI of 63.24, she is approached for a GLP 1 receptor agonist intervention, she opted for oral semaglutide.  Side effects and precautions are discussed with her.    -I discussed and prescribed Rybelsus 3 mg p.o. daily at breakfast.  This medication will be advanced as she tolerates.  She is on chlorthalidone 25 mg p.o. daily, and vitamin D2 50,000 units weekly.  - she is advised to maintain close follow up with Gilmore Laroche, FNP for primary care needs.   I spent  26  minutes in the care of the patient today including review of labs from Thyroid Function, CMP, and other relevant labs ; imaging/biopsy records (current and previous including abstractions from other facilities); face-to-face time discussing  her lab results and symptoms, medications doses, her options of short and long term treatment based on the latest standards of care / guidelines;   and documenting the encounter.  Jyssica Darnelle Going  participated in the discussions, expressed understanding, and voiced agreement with the above plans.  All questions were answered to her satisfaction. she is encouraged to contact clinic should she have any questions or concerns prior to her return visit.    Follow up plan: Return in about 4 months (around 07/04/2023) for A1c -NV, F/U with Biopsy Results.   Marquis Lunch, MD [facility] [address] [place], [place] [address] Phone: [phone]  Fax: [phone]     03/06/2023, 4:40 PM  This note was partially dictated with voice recognition software. Similar sounding words can be transcribed inadequately or may not  be corrected upon review.

## 2023-03-06 NOTE — Patient Instructions (Signed)

## 2023-03-11 ENCOUNTER — Other Ambulatory Visit: Payer: Self-pay | Admitting: Family Medicine

## 2023-03-11 DIAGNOSIS — E119 Type 2 diabetes mellitus without complications: Secondary | ICD-10-CM

## 2023-03-12 ENCOUNTER — Other Ambulatory Visit: Payer: Self-pay | Admitting: Family Medicine

## 2023-03-12 DIAGNOSIS — E559 Vitamin D deficiency, unspecified: Secondary | ICD-10-CM

## 2023-03-12 MED ORDER — VITAMIN D (ERGOCALCIFEROL) 1.25 MG (50000 UNIT) PO CAPS
50000.0000 [IU] | ORAL_CAPSULE | ORAL | 1 refills | Status: DC
Start: 2023-03-12 — End: 2023-06-05

## 2023-03-12 NOTE — Progress Notes (Signed)
Hello Norma Horton Your vitamin D levels are slightly low, and a prescription for a weekly vitamin D supplement has been sent to your pharmacy for you to start. Please continue your current treatment regimen for type 2 diabetes. Additionally, I recommend lifestyle modifications, including decreasing your intake of high-sugar foods and beverages and increasing physical activity.

## 2023-03-20 ENCOUNTER — Ambulatory Visit (HOSPITAL_COMMUNITY)
Admission: RE | Admit: 2023-03-20 | Discharge: 2023-03-20 | Disposition: A | Discharge status: Home / Self Care | Payer: 59 | Source: Ambulatory Visit | Attending: "Endocrinology | Admitting: "Endocrinology

## 2023-03-20 ENCOUNTER — Encounter (HOSPITAL_COMMUNITY): Payer: Self-pay

## 2023-03-20 DIAGNOSIS — E041 Nontoxic single thyroid nodule: Secondary | ICD-10-CM | POA: Diagnosis not present

## 2023-03-20 DIAGNOSIS — E049 Nontoxic goiter, unspecified: Secondary | ICD-10-CM | POA: Diagnosis present

## 2023-03-20 MED ORDER — LIDOCAINE HCL (PF) 2 % IJ SOLN
INTRAMUSCULAR | Status: AC
  Filled 2023-03-20: qty 10

## 2023-03-20 MED ORDER — LIDOCAINE HCL (PF) 2 % IJ SOLN
10.0000 mL | Freq: Once | INTRAMUSCULAR | Status: AC
  Administered 2023-03-20: 10 mL

## 2023-03-20 NOTE — Progress Notes (Signed)
 PT tolerated thyroid biopsy procedure well today. Labs and afirma obtained and sent for pathology by Richard from ultrasound. PT ambulatory at discharge with no acute distress noted and verbalized understanding of discharge instructions.

## 2023-03-21 ENCOUNTER — Other Ambulatory Visit (HOSPITAL_COMMUNITY): Payer: Self-pay

## 2023-03-21 ENCOUNTER — Telehealth: Payer: Self-pay

## 2023-03-21 NOTE — Telephone Encounter (Signed)
Pharmacy Patient Advocate Encounter   Received notification from Pt Calls Messages that prior authorization for Rybelsus is required/requested.   Insurance verification completed.   The patient is insured through [employer] [facility] .   Per test claim: PA required; PA submitted to above mentioned insurance via CoverMyMeds Key/confirmation #/EOC [id number] Status is pending

## 2023-03-23 LAB — CYTOLOGY - NON PAP

## 2023-03-27 NOTE — Telephone Encounter (Signed)
Pharmacy Patient Advocate Encounter  Received notification from [employer] [facility] that Prior Authorization for rybelsus has been DENIED.  Full denial letter will be uploaded to the media tab. See denial reason below.  You are requesting this medication to use for weight loss. We reviewed the records received and your benefit plan coverage. Based on this information, the requested service is excluded from coverage under the health plan. This is not a covered benefit. Therefore, this request has not been approved  PA #/Case ID/Reference #: [id number]

## 2023-03-27 NOTE — Telephone Encounter (Signed)
Yes I see that and her A1c labs do support that. However, I think the way her most recent chart note is written may be the problem:     I think when they are reading this, they are seeing Rybelsus listed under Obesity.

## 2023-03-27 NOTE — Telephone Encounter (Signed)
Pt has a diagnosis of Type 2 diabetes mellitus.

## 2023-03-28 ENCOUNTER — Other Ambulatory Visit: Payer: Self-pay | Admitting: "Endocrinology

## 2023-03-28 MED ORDER — OZEMPIC (0.25 OR 0.5 MG/DOSE) 2 MG/3ML ~~LOC~~ SOPN: 0.2500 mg | PEN_INJECTOR | SUBCUTANEOUS | 1 refills | Status: DC

## 2023-03-28 MED ORDER — BD PEN NEEDLE NANO U/F 32G X 4 MM MISC: 1.0000 | Freq: Four times a day (QID) | 1 refills | Status: DC

## 2023-03-28 NOTE — Telephone Encounter (Signed)
Noted  

## 2023-03-28 NOTE — Telephone Encounter (Signed)
Spoke with pt, she stated she would like to try the Ozempic.

## 2023-04-03 ENCOUNTER — Other Ambulatory Visit (HOSPITAL_COMMUNITY): Payer: Self-pay

## 2023-04-03 ENCOUNTER — Telehealth: Payer: Self-pay

## 2023-04-03 NOTE — Telephone Encounter (Signed)
Pharmacy Patient Advocate Encounter   Received notification from CoverMyMeds that prior authorization for ozempic is required/requested.   Insurance verification completed.   The patient is insured through [employer] [facility] .   Per test claim: PA required; PA submitted to above mentioned insurance via CoverMyMeds Key/confirmation #/EOC [facility] Status is pending

## 2023-04-04 ENCOUNTER — Telehealth: Payer: Self-pay | Admitting: Family Medicine

## 2023-04-04 NOTE — Telephone Encounter (Signed)
Copied from CRM [id number]. Topic: General - Other >> Apr 04, 2023  9:16 AM Desma Mcgregor wrote: Reason for CRM: Patient called in regarding the FMLA forms that were provided in-person about a month ago. They have yet to be received by her employer as of today. Please f/u at [phone]. This is past her deadline date to submit.

## 2023-04-04 NOTE — Telephone Encounter (Signed)
Called [email or web address]

## 2023-04-05 NOTE — Telephone Encounter (Signed)
Spoke to Norma Horton let her know I will refax forms again they are also in her chart if a copy is needed.

## 2023-04-06 ENCOUNTER — Encounter (HOSPITAL_COMMUNITY): Payer: Self-pay

## 2023-04-07 NOTE — Telephone Encounter (Signed)
Pharmacy Patient Advocate Encounter  Received notification from [employer] [facility] that Prior Authorization for Ozempic has been APPROVED from 04/05/23 to 04/04/24

## 2023-04-18 ENCOUNTER — Telehealth (HOSPITAL_COMMUNITY): Payer: Self-pay

## 2023-04-18 ENCOUNTER — Telehealth: Payer: Self-pay | Admitting: "Endocrinology

## 2023-04-18 NOTE — Telephone Encounter (Signed)
Called 2x, going straight to VM

## 2023-04-18 NOTE — Telephone Encounter (Signed)
Called patient per Dr Fransico Him to come in tomorrow to discuss biopsy results. I left her a VM. Overbook where needed

## 2023-04-18 NOTE — Telephone Encounter (Signed)
04/20/23 appt confirmed by pt

## 2023-04-19 ENCOUNTER — Ambulatory Visit (INDEPENDENT_AMBULATORY_CARE_PROVIDER_SITE_OTHER): Payer: 59 | Admitting: "Endocrinology

## 2023-04-19 ENCOUNTER — Encounter: Payer: Self-pay | Admitting: "Endocrinology

## 2023-04-19 VITALS — BP 128/86 | HR 76 | Ht 63.0 in | Wt 349.8 lb

## 2023-04-19 DIAGNOSIS — E049 Nontoxic goiter, unspecified: Secondary | ICD-10-CM | POA: Diagnosis not present

## 2023-04-19 DIAGNOSIS — E119 Type 2 diabetes mellitus without complications: Secondary | ICD-10-CM

## 2023-04-19 DIAGNOSIS — R899 Unspecified abnormal finding in specimens from other organs, systems and tissues: Secondary | ICD-10-CM | POA: Insufficient documentation

## 2023-04-19 DIAGNOSIS — I10 Essential (primary) hypertension: Secondary | ICD-10-CM | POA: Diagnosis not present

## 2023-04-19 DIAGNOSIS — Z7984 Long term (current) use of oral hypoglycemic drugs: Secondary | ICD-10-CM

## 2023-04-19 NOTE — Progress Notes (Signed)
04/19/2023, 4:36 PM   Endocrinology follow-up note  Subjective:    Patient ID: Norma Horton, female    DOB: 1970-06-23, PCP Gilmore Laroche, FNP   Past Medical History:  Diagnosis Date   Anxiety    Depression    Pt has had suicide ideations in the past.   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   Past Surgical History:  Procedure Laterality Date   BIOPSY THYROID  03/2023   CHOLECYSTECTOMY     TUBAL LIGATION     Social History   Socioeconomic History   Marital status: Married    Spouse name: Not on file   Number of children: 4   Years of education: Not on file   Highest education level: Not on file  Occupational History   Not on file  Tobacco Use   Smoking status: Never   Smokeless tobacco: Never  Vaping Use   Vaping status: Never Used  Substance and Sexual Activity   Alcohol use: Yes    Comment: occ   Drug use: No   Sexual activity: Yes    Birth control/protection: Surgical, Post-menopausal    Comment: tubal  Other Topics Concern   Not on file  Social History Narrative   Lives with her husband and her children    Social Drivers of Health   Financial Resource Strain: Medium Risk (02/03/2022)   Overall Financial Resource Strain (CARDIA)    Difficulty of Paying Living Expenses: Somewhat hard  Food Insecurity: No Food Insecurity (02/03/2022)   Hunger Vital Sign    Worried About Running Out of Food in the Last Year: Never true    Ran Out of Food in the Last Year: Never true  Transportation Needs: No Transportation Needs (02/03/2022)   PRAPARE - Administrator, Civil Service (Medical): No    Lack of Transportation (Non-Medical): No  Physical Activity: Inactive (02/03/2022)   Exercise Vital Sign    Days of Exercise per Week: 0 days    Minutes of Exercise per Session: 0 min  Stress: Stress Concern Present (02/03/2022)   [name] of  Occupational Health - Occupational Stress Questionnaire    Feeling of Stress : Very much  Social Connections: Moderately Isolated (02/03/2022)   Social Connection and Isolation Panel [NHANES]    Frequency of Communication with Friends and Family: More than three times a week    Frequency of Social Gatherings with Friends and Family: Twice a week    Attends Religious Services: Never    Database administrator or Organizations: No    Attends Engineer, structural: Never    Marital Status: Married   Family History  Problem Relation Age of Onset   Breast cancer Maternal Grandmother    Heart attack Father    Hypertension Father    COPD Mother    Diabetes Mellitus II Brother    Throat cancer Brother    Cerebral aneurysm Sister    Cervical cancer Sister    Cervical cancer Sister    Heart attack Sister    Diabetes Daughter    Asthma Daughter  Cystic fibrosis Daughter    Hypertension Other    Outpatient Encounter Medications as of 04/19/2023  Medication Sig   albuterol (VENTOLIN HFA) 108 (90 Base) MCG/ACT inhaler INHALE 2 PUFFS BY MOUTH EVERY 6 HOURS AS NEEDED FOR WHEEZING AND FOR SHORTNESS OF BREATH   BREYNA 80-4.5 MCG/ACT inhaler Inhale 2 puffs by mouth twice daily   budesonide (RHINOCORT ALLERGY) 32 MCG/ACT nasal spray Place 1 spray into both nostrils 2 (two) times daily.   calamine lotion Apply 1 Application topically as needed for itching.   chlorthalidone (HYGROTON) 25 MG tablet Take 1 tablet (25 mg total) by mouth daily.   clonazePAM (KLONOPIN) 0.5 MG tablet Take 0.5 mg by mouth 3 (three) times daily as needed.   gabapentin (NEURONTIN) 100 MG capsule Take 1 capsule (100 mg total) by mouth at bedtime.   Insulin Pen Needle (BD PEN NEEDLE NANO U/F) 32G X 4 MM MISC 1 each by Does not apply route 4 (four) times daily.   metFORMIN (GLUCOPHAGE) 1000 MG tablet Take 1 tablet (1,000 mg total) by mouth 2 (two) times daily with a meal.   naproxen (NAPROSYN) 500 MG tablet TAKE 1  TABLET BY MOUTH TWICE DAILY WITH A MEAL   pantoprazole (PROTONIX) 40 MG tablet Take 1 tablet (40 mg total) by mouth as needed.   Semaglutide,0.25 or 0.5MG /DOS, (OZEMPIC, 0.25 OR 0.5 MG/DOSE,) 2 MG/3ML SOPN Inject 0.25 mg into the skin once a week.   sulfamethoxazole-trimethoprim (BACTRIM DS) 800-160 MG tablet Take 1 tablet by mouth 2 (two) times daily.   valACYclovir (VALTREX) 500 MG tablet Take 500 mg by mouth 1 day or 1 dose.   Vitamin D, Ergocalciferol, (DRISDOL) 1.25 MG (50000 UNIT) CAPS capsule Take 1 capsule (50,000 Units total) by mouth every 7 (seven) days.   No facility-administered encounter medications on file as of 04/19/2023.   ALLERGIES: Allergies  Allergen Reactions   Fenoprofen Calcium     VACCINATION STATUS: Immunization History  Administered Date(s) Administered   Tdap 05/23/1998, 11/16/2021   Zoster Recombinant(Shingrix) 01/21/2022, 04/22/2022    HPI Norma Horton is 52 y.o. female who presents today with a medical history as above. she is being seen in follow-up after she was seen in consultation for multinodular goiter requested by Gilmore Laroche, FNP.  See notes from previous visit.  She has history of multinodular goiter dating back to 2017.   She did have biopsy of a  right lobe nodule where results were reported to be atypia of undetermined significance.  Her recent repeat thyroid ultrasound in July 2024  did not show significant change in the size of the nodule-6.4 x 4.7 x 3cm. She was sent for repeat FNA after her last visit. Her FNA was sent for Afirma, which resulted in ~50% risk of malignancy. She was called in before her scheduled appointment to dicuss her biopsy results.  Patient denies dysphagia, voice change, nor shortness of breath at this time.     She has no family history of thyroid dysfunction or thyroid malignancy.  She continues to have euthyroid state on thyroid function tests. Her other medical problems include type 2 diabetes on  metformin 1000 mg p.o. twice daily.  Her recent A1c was 7.1%.   she was recently approved for Ozempic , did not start yet. She has hypertension on treatment with hydrochlorothiazide.   Review of Systems  Constitutional:  No recent weight change, no fatigue, no subjective hyperthermia, no subjective hypothermia Eyes: no blurry vision, no xerophthalmia  Objective:       04/19/2023    2:52 PM 03/20/2023    9:30 AM 03/06/2023   10:10 AM  Vitals with BMI  Height 5\' 3"   5\' 3"   Weight 349 lbs 13 oz  357 lbs  BMI 61.98  63.26  Systolic 128 172 161  Diastolic 86 82 84  Pulse 76 70 64    BP 128/86   Pulse 76   Ht 5\' 3"  (1.6 m)   Wt (!) 349 lb 12.8 oz (158.7 kg)   LMP 08/30/2016   BMI 61.96 kg/m   Wt Readings from Last 3 Encounters:  04/19/23 (!) 349 lb 12.8 oz (158.7 kg)  03/06/23 (!) 357 lb (161.9 kg)  01/31/23 (!) 354 lb 1.9 oz (160.6 kg)    Physical Exam  Constitutional:  Body mass index is 61.96 kg/m.,  not in acute distress, normal state of mind Eyes: PERRLA, EOMI, no exophthalmos ENT: moist mucous membranes, + gross thyromegaly, no gross cervical lymphadenopathy   CMP ( most recent) CMP     Component Value Date/Time   NA 138 03/03/2023 0915   K 4.6 03/03/2023 0915   CL 101 03/03/2023 0915   CO2 24 03/03/2023 0915   GLUCOSE 118 (H) 03/03/2023 0915   GLUCOSE 135 (H) 11/06/2010 2348   BUN 14 03/03/2023 0915   CREATININE 0.66 03/03/2023 0915   CALCIUM 9.5 03/03/2023 0915   PROT 7.3 03/03/2023 0915   ALBUMIN 4.1 03/03/2023 0915   AST 25 03/03/2023 0915   ALT 36 (H) 03/03/2023 0915   ALKPHOS 97 03/03/2023 0915   BILITOT 0.5 03/03/2023 0915   GFRNONAA >60 11/06/2010 2348   GFRAA >60 11/06/2010 2348    Thyroid ultrasound on November 19, 2015 FINDINGS: Right thyroid lobe   Measurements: 6.8 x 3.8 x 4.5 cm. Right upper pole predominately solid nodule measures 4.9 x 3.7 x 4.5 cm   Left thyroid lobe : Measurements: 4.9 x 1.5 x 1.4 cm. Heterogeneous tissue  without focal  nodule.   Isthmus : Thickness: 0.3 cm.  No nodules visualized.   Lymphadenopathy: None visualized.   IMPRESSION: Solitary large right lobe nodule measures 4.9 cm. Findings meet consensus criteria for biopsy. Ultrasound-guided fine needle aspiration should be considered, as per the consensus statement: Management of Thyroid Nodules Detected at [place]: Society of Radiologists in Ultrasound Consensus Conference Statement. Radiology 2005; [id number].  Final needle aspiration biopsy of thyroid nodules in August 2017 COMMENT: THE SPECIMEN IS HYPERCELLULAR AND CONSISTS OF SMALL AND MEDIUM SIZED GROUPS OF FOLLICULAR  EPITHELIAL CELLS WITH CYTOLOGIC ATYPIA, INCLUDING NUCLEAR ENLARGEMENT AND OVERLAP.    Patient did not want surgery.  Repeat thyroid ultrasound was performed on January 21, 2022 showing   Isthmus: 0.3 cm ,previously 0.3 cm   Right lobe: 7.7 x 3.9 x 4.7 cm ,previously 6.8 x 3.8 x 4.5 cm,    Left lobe: 5.2 x 1.1 x 1.5 cm ,previously 4.9 x 1.5 x 1.4 cm  Nodule in the right lobe of her thyroid size: 6.4 cm; Other 2 dimensions: 4.8 x 3.8 cm, previously,  4.9 x 3.7 x 4.5 cm  FNA 03/20/23 Clinical History: 6.4 cm RML; TI-RADS risk category: TR4 (4-6 pts.)  Specimen Submitted:  A. THYROID, RML, FINE NEEDLE ASPIRATION:    FINAL MICROSCOPIC DIAGNOSIS:  - Atypia of undetermined significance (Bethesda category III)   Afirma: GSC Classifier Oletha Blend Classifier - Suspicious ( ~ 50% risk of malignancy).  Diabetic Labs (most recent): Lab Results  Component Value Date  HGBA1C 7.1 (H) 03/03/2023   HGBA1C 7.0 (H) 10/31/2022   HGBA1C 7.3 (H) 07/25/2022     Lab Results  Component Value Date   TSH 3.280 03/03/2023   TSH 3.390 03/03/2023   TSH 2.920 10/31/2022   TSH 3.640 08/10/2022   TSH 2.300 07/25/2022   TSH 2.750 04/22/2022   TSH 3.900 01/21/2022   TSH 2.660 11/16/2021   FREET4 1.17 03/03/2023   FREET4 1.16 03/03/2023   FREET4 1.15 10/31/2022   FREET4  1.22 08/10/2022   FREET4 1.28 07/25/2022   FREET4 1.21 04/22/2022   FREET4 1.15 01/21/2022   FREET4 1.14 11/16/2021      Assessment & Plan:   1.  Euthyroid nodular goiter 2.  Morbid obesity   3.  Hypertension   4.  Type 2 diabetes  5. Vitamin D deficiency   - Based on these reviews, she has history of nodular goiter, growing overall, and growing nodule on the right lobe.  This nodule was previously biopsied with bethesda 3 findings in 2017.  After she was approached for a repeat biopsy of a 6.4 cm nodule in July 2024, she opted to avoid it.  She is reapproached due to the fact that she reports occasional compressive symptoms. Her biopsy result shows ~ 50% risk of malignancy based on Arirma GSC. She is approached for options of therapy. No gross nodular lesions on left ( contralateral lobe).  She opted for total thyroidectomy. 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 I will send her to Dr. Gerrit Friends for same. She will be considered for remnant ablation based on her surgical outcomes.   In light of her euthyroid presentation, she will not need intervention with thyroid hormone or antithyroid medications at this time.    -For her diabetes type 2 , hypertension, she is advised to continue on her current medications as well as follow-up with her PCP.  She is advised to continue metformin 1000 mg p.o. twice daily.  She was recently approved for Ozempic, advised to hold until her thyroid surgery.    She is on chlorthalidone 25 mg p.o. daily, and vitamin D2 50,000 units weekly.  - she is advised to maintain close follow up with Gilmore Laroche, FNP for primary care needs.   I spent   26 minutes in the care of the patient today including review of labs from Thyroid Function, CMP, and other relevant labs ; imaging/biopsy records (current and previous including abstractions from other facilities); face-to-face time discussing  her lab results and symptoms, medications doses,  her options of short and long term treatment based on the latest standards of care / guidelines;   and documenting the encounter.  Norma Horton  participated in the discussions, expressed understanding, and voiced agreement with the above plans.  All questions were answered to her satisfaction. she is encouraged to contact clinic should she have any questions or concerns prior to her return visit.   Follow up plan: Return for Keep Reg. Appt. with Pre-visit Labs, F/U with Labs after Surgery.   Marquis Lunch, MD [facility] [address] [place], [place] [address] Phone: [phone]  Fax: [phone]     04/19/2023, 4:36 PM  This note was partially dictated with voice recognition software. Similar sounding words can be transcribed inadequately or may not  be corrected upon review.

## 2023-04-20 ENCOUNTER — Ambulatory Visit (INDEPENDENT_AMBULATORY_CARE_PROVIDER_SITE_OTHER): Payer: PRIVATE HEALTH INSURANCE | Admitting: Psychiatry

## 2023-04-20 ENCOUNTER — Encounter (HOSPITAL_COMMUNITY): Payer: Self-pay | Admitting: Psychiatry

## 2023-04-20 DIAGNOSIS — F411 Generalized anxiety disorder: Secondary | ICD-10-CM

## 2023-04-20 DIAGNOSIS — F4000 Agoraphobia, unspecified: Secondary | ICD-10-CM | POA: Insufficient documentation

## 2023-04-20 DIAGNOSIS — F4310 Post-traumatic stress disorder, unspecified: Secondary | ICD-10-CM

## 2023-04-20 DIAGNOSIS — F332 Major depressive disorder, recurrent severe without psychotic features: Secondary | ICD-10-CM | POA: Diagnosis not present

## 2023-04-20 DIAGNOSIS — E559 Vitamin D deficiency, unspecified: Secondary | ICD-10-CM

## 2023-04-20 DIAGNOSIS — F603 Borderline personality disorder: Secondary | ICD-10-CM | POA: Diagnosis not present

## 2023-04-20 DIAGNOSIS — F5104 Psychophysiologic insomnia: Secondary | ICD-10-CM

## 2023-04-20 DIAGNOSIS — Z9151 Personal history of suicidal behavior: Secondary | ICD-10-CM | POA: Diagnosis not present

## 2023-04-20 DIAGNOSIS — F4001 Agoraphobia with panic disorder: Secondary | ICD-10-CM

## 2023-04-20 DIAGNOSIS — Z6981 Encounter for mental health services for victim of other abuse: Secondary | ICD-10-CM

## 2023-04-20 MED ORDER — SERTRALINE HCL 50 MG PO TABS: 50.0000 mg | ORAL_TABLET | Freq: Every day | ORAL | 1 refills | Status: DC

## 2023-04-20 MED ORDER — SERTRALINE HCL 25 MG PO TABS: 25.0000 mg | ORAL_TABLET | Freq: Every day | ORAL | 0 refills | Status: DC
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
## 2023-05-25 ENCOUNTER — Encounter (HOSPITAL_COMMUNITY): Payer: Self-pay | Admitting: Psychiatry

## 2023-05-25 ENCOUNTER — Telehealth (HOSPITAL_COMMUNITY): Payer: No Typology Code available for payment source | Admitting: Psychiatry

## 2023-05-25 DIAGNOSIS — F4310 Post-traumatic stress disorder, unspecified: Secondary | ICD-10-CM

## 2023-05-25 DIAGNOSIS — F4000 Agoraphobia, unspecified: Secondary | ICD-10-CM

## 2023-05-25 DIAGNOSIS — F603 Borderline personality disorder: Secondary | ICD-10-CM | POA: Diagnosis not present

## 2023-05-25 DIAGNOSIS — F332 Major depressive disorder, recurrent severe without psychotic features: Secondary | ICD-10-CM

## 2023-05-25 DIAGNOSIS — F411 Generalized anxiety disorder: Secondary | ICD-10-CM | POA: Diagnosis not present

## 2023-05-25 MED ORDER — SERTRALINE HCL 50 MG PO TABS: 50.0000 mg | ORAL_TABLET | Freq: Every day | ORAL | 1 refills | Status: DC

## 2023-05-25 NOTE — Patient Instructions (Signed)
We increased the Zoloft to 50 mg once daily today.
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
## 2023-05-28 ENCOUNTER — Other Ambulatory Visit: Payer: Self-pay | Admitting: Nurse Practitioner

## 2023-05-28 DIAGNOSIS — K219 Gastro-esophageal reflux disease without esophagitis: Secondary | ICD-10-CM

## 2023-06-01 ENCOUNTER — Other Ambulatory Visit: Payer: Self-pay | Admitting: "Endocrinology

## 2023-06-05 ENCOUNTER — Ambulatory Visit (INDEPENDENT_AMBULATORY_CARE_PROVIDER_SITE_OTHER): Payer: Self-pay | Admitting: Family Medicine

## 2023-06-05 ENCOUNTER — Encounter: Payer: Self-pay | Admitting: Family Medicine

## 2023-06-05 VITALS — BP 135/85 | HR 70 | Ht 63.0 in | Wt 353.0 lb

## 2023-06-05 DIAGNOSIS — E1169 Type 2 diabetes mellitus with other specified complication: Secondary | ICD-10-CM

## 2023-06-05 DIAGNOSIS — J4540 Moderate persistent asthma, uncomplicated: Secondary | ICD-10-CM

## 2023-06-05 DIAGNOSIS — K219 Gastro-esophageal reflux disease without esophagitis: Secondary | ICD-10-CM

## 2023-06-05 DIAGNOSIS — F411 Generalized anxiety disorder: Secondary | ICD-10-CM

## 2023-06-05 DIAGNOSIS — E038 Other specified hypothyroidism: Secondary | ICD-10-CM

## 2023-06-05 DIAGNOSIS — I10 Essential (primary) hypertension: Secondary | ICD-10-CM

## 2023-06-05 DIAGNOSIS — Z7985 Long-term (current) use of injectable non-insulin antidiabetic drugs: Secondary | ICD-10-CM

## 2023-06-05 DIAGNOSIS — Z7984 Long term (current) use of oral hypoglycemic drugs: Secondary | ICD-10-CM

## 2023-06-05 DIAGNOSIS — E559 Vitamin D deficiency, unspecified: Secondary | ICD-10-CM

## 2023-06-05 DIAGNOSIS — G2581 Restless legs syndrome: Secondary | ICD-10-CM

## 2023-06-05 DIAGNOSIS — E119 Type 2 diabetes mellitus without complications: Secondary | ICD-10-CM

## 2023-06-05 DIAGNOSIS — E7849 Other hyperlipidemia: Secondary | ICD-10-CM

## 2023-06-05 MED ORDER — UBRELVY 100 MG PO TABS: 100.0000 mg | ORAL_TABLET | Freq: Every day | ORAL | Status: DC | PRN

## 2023-06-05 MED ORDER — ALBUTEROL SULFATE HFA 108 (90 BASE) MCG/ACT IN AERS: 2.0000 | INHALATION_SPRAY | Freq: Four times a day (QID) | RESPIRATORY_TRACT | 1 refills | Status: DC | PRN

## 2023-06-05 MED ORDER — PANTOPRAZOLE SODIUM 40 MG PO TBEC: 40.0000 mg | DELAYED_RELEASE_TABLET | Freq: Every day | ORAL | 1 refills | Status: DC

## 2023-06-05 MED ORDER — VITAMIN D (ERGOCALCIFEROL) 1.25 MG (50000 UNIT) PO CAPS: 50000.0000 [IU] | ORAL_CAPSULE | ORAL | 1 refills | Status: AC

## 2023-06-05 MED ORDER — BUDESONIDE-FORMOTEROL FUMARATE 80-4.5 MCG/ACT IN AERO: 2.0000 | INHALATION_SPRAY | Freq: Two times a day (BID) | RESPIRATORY_TRACT | 3 refills | Status: DC

## 2023-06-05 MED ORDER — SERTRALINE HCL 50 MG PO TABS: 50.0000 mg | ORAL_TABLET | Freq: Every day | ORAL | 1 refills | Status: DC

## 2023-06-05 NOTE — Assessment & Plan Note (Signed)
Stable Denies SI/HI Refill sent    01/31/2023    9:41 AM 11/29/2022    1:50 PM 11/11/2022    9:50 AM 10/31/2022    9:35 AM  GAD 7 : Generalized Anxiety Score  Nervous, Anxious, on Edge 0 1 1 3   Control/stop worrying 3 3 3 3   Worry too much - different things 3 3 3 3   Trouble relaxing 0 3 3 3   Restless 0 0 0 3  Easily annoyed or irritable 0 2 2 3   Afraid - awful might happen 0 0 0 3  Total GAD 7 Score 6 12 12 21   Anxiety Difficulty Not difficult at all Somewhat difficult Somewhat difficult Not difficult at all

## 2023-06-05 NOTE — Assessment & Plan Note (Signed)
Stable with Protonix 40 mg PRN She denies dyspepsia and heartburn  Encouraged to continue treatment regimen GERD diet encouraged

## 2023-06-05 NOTE — Assessment & Plan Note (Signed)
Denies polyuria, polyphagia, polydipsia She takes metformin at 1000 mg twice daily and glimepiride 2 mg daily Recommend avoiding simple carbohydrates, including cakes, sweet desserts, ice cream, soda (diet or regular), sweet tea, candies, chips, cookies, store-bought juices, alcohol in excess of 1-2 drinks a day, lemonade, artificial sweeteners, donuts, coffee creamers, and sugar-free products.  I recommend avoiding greasy, fatty foods with increased physical activity.  Lab Results  Component Value Date   HGBA1C 7.1 (H) 03/03/2023

## 2023-06-05 NOTE — Assessment & Plan Note (Signed)
 Refills sent

## 2023-06-05 NOTE — Patient Instructions (Addendum)

## 2023-06-05 NOTE — Assessment & Plan Note (Signed)
Encouraged to increase his intake of vitamin D-rich foods such as fatty fish (e.g., salmon, mackerel, and sardines), fortified dairy products, egg yolks, and fortified cereals.  Refills sent

## 2023-06-05 NOTE — Progress Notes (Signed)
Established Patient Office Visit  Subjective:  Patient ID: Norma Horton, female    DOB: 09/29/1970  Age: 53 y.o. MRN: [id number]  CC:  Chief Complaint  Patient presents with   Follow-up    4 mo DM f/u     HPI Norma Horton is a 53 y.o. female with past medical history of hypertension, GERD, type II diabetes presents for f/u of  chronic medical conditions. For the details of today's visit, please refer to the assessment and plan.     Past Medical History:  Diagnosis Date   Anxiety    Depression    Pt has had suicide ideations in the past.   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    Past Surgical History:  Procedure Laterality Date   BIOPSY THYROID  03/2023   CHOLECYSTECTOMY     TUBAL LIGATION      Family History  Problem Relation Age of Onset   Breast cancer Maternal Grandmother    Heart attack Father    Hypertension Father    COPD Mother    Diabetes Mellitus II Brother    Throat cancer Brother    Cerebral aneurysm Sister    Cervical cancer Sister    Cervical cancer Sister    Heart attack Sister    Diabetes Daughter    Asthma Daughter    Cystic fibrosis Daughter    Hypertension Other     Social History   Socioeconomic History   Marital status: Married    Spouse name: Not on file   Number of children: 4   Years of education: Not on file   Highest education level: Not on file  Occupational History   Not on file  Tobacco Use   Smoking status: Never   Smokeless tobacco: Never  Vaping Use   Vaping status: Never Used  Substance and Sexual Activity   Alcohol use: Not Currently    Comment: Denied use as of 04/20/2023   Drug use: No   Sexual activity: Yes    Birth control/protection: Surgical, Post-menopausal    Comment: tubal  Other Topics Concern   Not on file  Social History Narrative   Lives with her husband and her children    Social Drivers of Health   Financial Resource Strain:  Medium Risk (02/03/2022)   Overall Financial Resource Strain (CARDIA)    Difficulty of Paying Living Expenses: Somewhat hard  Food Insecurity: No Food Insecurity (02/03/2022)   Hunger Vital Sign    Worried About Running Out of Food in the Last Year: Never true    Ran Out of Food in the Last Year: Never true  Transportation Needs: No Transportation Needs (02/03/2022)   PRAPARE - Administrator, Civil Service (Medical): No    Lack of Transportation (Non-Medical): No  Physical Activity: Inactive (02/03/2022)   Exercise Vital Sign    Days of Exercise per Week: 0 days    Minutes of Exercise per Session: 0 min  Stress: Stress Concern Present (02/03/2022)   [name]    Feeling of Stress : Very much  Social Connections: Moderately Isolated (02/03/2022)   Social Connection and Isolation Panel [NHANES]    Frequency of Communication with Friends and Family: More than three times a week    Frequency of Social Gatherings with Friends and Family: Twice a week  Attends Religious Services: Never    Active Member of Clubs or Organizations: No    Attends Banker Meetings: Never    Marital Status: Married  Catering manager Violence: Not At Risk (02/03/2022)   Humiliation, Afraid, Rape, and Kick questionnaire    Fear of Current or Ex-Partner: No    Emotionally Abused: No    Physically Abused: No    Sexually Abused: No    Outpatient Medications Prior to Visit  Medication Sig Dispense Refill   chlorthalidone (HYGROTON) 25 MG tablet Take 1 tablet (25 mg total) by mouth daily. 90 tablet 2   Cyanocobalamin (B-12 PO) Take by mouth daily.     gabapentin (NEURONTIN) 100 MG capsule Take 1 capsule (100 mg total) by mouth at bedtime. 30 capsule 2   Insulin Pen Needle (BD PEN NEEDLE NANO U/F) 32G X 4 MM MISC 1 each by Does not apply route 4 (four) times daily. 100 each 1   metFORMIN (GLUCOPHAGE) 1000 MG tablet Take 1  tablet (1,000 mg total) by mouth 2 (two) times daily with a meal. 180 tablet 3   Semaglutide,0.25 or 0.5MG /DOS, (OZEMPIC, 0.25 OR 0.5 MG/DOSE,) 2 MG/3ML SOPN Inject 0.25 mg into the skin once a week. 6 mL 1   valACYclovir (VALTREX) 500 MG tablet Take 500 mg by mouth 1 day or 1 dose.     albuterol (VENTOLIN HFA) 108 (90 Base) MCG/ACT inhaler INHALE 2 PUFFS BY MOUTH EVERY 6 HOURS AS NEEDED FOR WHEEZING AND FOR SHORTNESS OF BREATH 7 each 0   BREYNA 80-4.5 MCG/ACT inhaler Inhale 2 puffs by mouth twice daily 11 g 0   naproxen (NAPROSYN) 500 MG tablet TAKE 1 TABLET BY MOUTH TWICE DAILY WITH A MEAL 30 tablet 0   pantoprazole (PROTONIX) 40 MG tablet Take 1 tablet by mouth once daily 30 tablet 0   sertraline (ZOLOFT) 50 MG tablet Take 1 tablet (50 mg total) by mouth daily. 30 tablet 1   Vitamin D, Ergocalciferol, (DRISDOL) 1.25 MG (50000 UNIT) CAPS capsule Take 1 capsule (50,000 Units total) by mouth every 7 (seven) days. 20 capsule 1   No facility-administered medications prior to visit.    Allergies  Allergen Reactions   Fenoprofen Calcium     ROS Review of Systems  Constitutional:  Negative for chills and fever.  Eyes:  Negative for visual disturbance.  Respiratory:  Negative for chest tightness and shortness of breath.   Neurological:  Negative for dizziness and headaches.      Objective:    Physical Exam HENT:     Head: Normocephalic.     Mouth/Throat:     Mouth: Mucous membranes are moist.  Cardiovascular:     Rate and Rhythm: Normal rate.     Heart sounds: Normal heart sounds.  Pulmonary:     Effort: Pulmonary effort is normal.     Breath sounds: Normal breath sounds.  Neurological:     Mental Status: She is alert.     BP 135/85   Pulse 70   Ht 5\' 3"  (1.6 m)   Wt (!) 353 lb 0.6 oz (160.1 kg)   LMP 08/30/2016   SpO2 91%   BMI 62.54 kg/m  Wt Readings from Last 3 Encounters:  06/05/23 (!) 353 lb 0.6 oz (160.1 kg)  04/19/23 (!) 349 lb 12.8 oz (158.7 kg)  03/06/23 (!)  357 lb (161.9 kg)    Lab Results  Component Value Date   TSH 3.280 03/03/2023   TSH 3.390 03/03/2023  Lab Results  Component Value Date   WBC 8.7 03/03/2023   HGB 14.9 03/03/2023   HCT 44.6 03/03/2023   MCV 94 03/03/2023   PLT 269 03/03/2023   Lab Results  Component Value Date   NA 138 03/03/2023   K 4.6 03/03/2023   CO2 24 03/03/2023   GLUCOSE 118 (H) 03/03/2023   BUN 14 03/03/2023   CREATININE 0.66 03/03/2023   BILITOT 0.5 03/03/2023   ALKPHOS 97 03/03/2023   AST 25 03/03/2023   ALT 36 (H) 03/03/2023   PROT 7.3 03/03/2023   ALBUMIN 4.1 03/03/2023   CALCIUM 9.5 03/03/2023   EGFR 104 03/03/2023   Lab Results  Component Value Date   CHOL 162 03/03/2023   Lab Results  Component Value Date   HDL 57 03/03/2023   Lab Results  Component Value Date   LDLCALC 84 03/03/2023   Lab Results  Component Value Date   TRIG 119 03/03/2023   Lab Results  Component Value Date   CHOLHDL 2.8 03/03/2023   Lab Results  Component Value Date   HGBA1C 7.1 (H) 03/03/2023      Assessment & Plan:  Essential hypertension, benign Assessment & Plan: Controlled She takes chlorthalidone 25 mg daily She reports compliance with treatment regimen  A low-sodium diet of less than 2300 mg daily is recommended, along with increased physical activity of moderate intensity, aiming for 150 minutes weekly.  BP Readings from Last 3 Encounters:  06/05/23 135/85  04/19/23 128/86  03/20/23 (!) 172/82      Type 2 diabetes mellitus without complication, without long-term current use of insulin (HCC) Assessment & Plan: Denies polyuria, polyphagia, polydipsia She takes metformin at 1000 mg twice daily and glimepiride 2 mg daily Recommend avoiding simple carbohydrates, including cakes, sweet desserts, ice cream, soda (diet or regular), sweet tea, candies, chips, cookies, store-bought juices, alcohol in excess of 1-2 drinks a day, lemonade, artificial sweeteners, donuts, coffee creamers, and  sugar-free products.  I recommend avoiding greasy, fatty foods with increased physical activity.  Lab Results  Component Value Date   HGBA1C 7.1 (H) 03/03/2023     Orders: -     Hemoglobin A1c  Restless leg syndrome Assessment & Plan: The patient complains of restless legs, which worsen at bedtime. She is currently on gabapentin, but her iron panel will be assessed today. She is encouraged to continue taking gabapentin as prescribed. She is also encouraged to engage in moderate-intensity exercise, maintain a consistent sleep schedule, and limit caffeine, alcohol, and nicotine. Additionally, she is advised to stay hydrated, as dehydration can worsen symptoms. The patient expresses understanding.   Orders: -     Iron, TIBC and Ferritin Panel  Gastroesophageal reflux disease, unspecified whether esophagitis present Assessment & Plan: Stable with Protonix 40 mg PRN She denies dyspepsia and heartburn  Encouraged to continue treatment regimen GERD diet encouraged  Orders: -     Pantoprazole Sodium; Take 1 tablet (40 mg total) by mouth daily.  Dispense: 60 tablet; Refill: 1  Generalized anxiety disorder Assessment & Plan: Stable Denies SI/HI Refill sent    01/31/2023    9:41 AM 11/29/2022    1:50 PM 11/11/2022    9:50 AM 10/31/2022    9:35 AM  GAD 7 : Generalized Anxiety Score  Nervous, Anxious, on Edge 0 1 1 3   Control/stop worrying 3 3 3 3   Worry too much - different things 3 3 3 3   Trouble relaxing 0 3 3 3   Restless 0 0  0 3  Easily annoyed or irritable 0 2 2 3   Afraid - awful might happen 0 0 0 3  Total GAD 7 Score 6 12 12 21   Anxiety Difficulty Not difficult at all Somewhat difficult Somewhat difficult Not difficult at all      Orders: -     Sertraline HCl; Take 1 tablet (50 mg total) by mouth daily.  Dispense: 60 tablet; Refill: 1  Vitamin D deficiency Assessment & Plan: Encouraged to increase his intake of vitamin D-rich foods such as fatty fish (e.g., salmon,  mackerel, and sardines), fortified dairy products, egg yolks, and fortified cereals.  Refills sent  Orders: -     Vitamin D (Ergocalciferol); Take 1 capsule (50,000 Units total) by mouth every 7 (seven) days.  Dispense: 20 capsule; Refill: 1 -     VITAMIN D 25 Hydroxy (Vit-D Deficiency, Fractures)  Moderate persistent asthma without complication Assessment & Plan: Refills sent  Orders: -     Albuterol Sulfate HFA; Inhale 2 puffs into the lungs every 6 (six) hours as needed for wheezing or shortness of breath.  Dispense: 6.7 each; Refill: 1 -     Budesonide-Formoterol Fumarate; Inhale 2 puffs into the lungs 2 (two) times daily.  Dispense: 10.2 g; Refill: 3  TSH (thyroid-stimulating hormone deficiency) -     TSH + free T4  Other hyperlipidemia -     Lipid panel -     CMP14+EGFR -     CBC with Differential/Platelet   Note: This chart has been completed using Engineer, civil (consulting) software, and while attempts have been made to ensure accuracy, certain words and phrases may not be transcribed as intended.   Follow-up: Return in about 4 months (around 10/03/2023).   Gilmore Laroche, FNP

## 2023-06-05 NOTE — Assessment & Plan Note (Signed)
Controlled She takes chlorthalidone 25 mg daily She reports compliance with treatment regimen  A low-sodium diet of less than 2300 mg daily is recommended, along with increased physical activity of moderate intensity, aiming for 150 minutes weekly.  BP Readings from Last 3 Encounters:  06/05/23 135/85  04/19/23 128/86  03/20/23 (!) 172/82

## 2023-06-05 NOTE — Assessment & Plan Note (Signed)
The patient complains of restless legs, which worsen at bedtime. She is currently on gabapentin, but her iron panel will be assessed today. She is encouraged to continue taking gabapentin as prescribed. She is also encouraged to engage in moderate-intensity exercise, maintain a consistent sleep schedule, and limit caffeine, alcohol, and nicotine. Additionally, she is advised to stay hydrated, as dehydration can worsen symptoms. The patient expresses understanding.

## 2023-07-03 ENCOUNTER — Other Ambulatory Visit: Payer: Self-pay | Admitting: Family Medicine

## 2023-07-03 DIAGNOSIS — R600 Localized edema: Secondary | ICD-10-CM

## 2023-07-05 ENCOUNTER — Telehealth: Payer: Self-pay

## 2023-07-05 ENCOUNTER — Ambulatory Visit: Payer: 59 | Admitting: "Endocrinology

## 2023-07-05 ENCOUNTER — Encounter: Payer: Self-pay | Admitting: "Endocrinology

## 2023-07-05 ENCOUNTER — Ambulatory Visit (INDEPENDENT_AMBULATORY_CARE_PROVIDER_SITE_OTHER): Payer: Self-pay | Admitting: "Endocrinology

## 2023-07-05 VITALS — BP 110/82 | HR 72 | Ht 63.0 in | Wt 347.2 lb

## 2023-07-05 DIAGNOSIS — E049 Nontoxic goiter, unspecified: Secondary | ICD-10-CM

## 2023-07-05 DIAGNOSIS — R899 Unspecified abnormal finding in specimens from other organs, systems and tissues: Secondary | ICD-10-CM | POA: Diagnosis not present

## 2023-07-05 NOTE — Progress Notes (Signed)
 07/05/2023, 10:05 AM   Endocrinology follow-up note  Subjective:    Patient ID: Norma Horton, female    DOB: 06-02-1970, PCP Gilmore Laroche, FNP   Past Medical History:  Diagnosis Date   Anxiety    Depression    Pt has had suicide ideations in the past.   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   Past Surgical History:  Procedure Laterality Date   BIOPSY THYROID  03/2023   CHOLECYSTECTOMY     TUBAL LIGATION     Social History   Socioeconomic History   Marital status: Married    Spouse name: Not on file   Number of children: 4   Years of education: Not on file   Highest education level: Not on file  Occupational History   Not on file  Tobacco Use   Smoking status: Never   Smokeless tobacco: Never  Vaping Use   Vaping status: Never Used  Substance and Sexual Activity   Alcohol use: Not Currently    Comment: Denied use as of 04/20/2023   Drug use: No   Sexual activity: Yes    Birth control/protection: Surgical, Post-menopausal    Comment: tubal  Other Topics Concern   Not on file  Social History Narrative   Lives with her husband and her children    Social Drivers of Health   Financial Resource Strain: Medium Risk (02/03/2022)   Overall Financial Resource Strain (CARDIA)    Difficulty of Paying Living Expenses: Somewhat hard  Food Insecurity: No Food Insecurity (02/03/2022)   Hunger Vital Sign    Worried About Running Out of Food in the Last Year: Never true    Ran Out of Food in the Last Year: Never true  Transportation Needs: No Transportation Needs (02/03/2022)   PRAPARE - Administrator, Civil Service (Medical): No    Lack of Transportation (Non-Medical): No  Physical Activity: Inactive (02/03/2022)   Exercise Vital Sign    Days of Exercise per Week: 0 days    Minutes of Exercise per Session: 0 min  Stress: Stress Concern Present  (02/03/2022)   [name]    Feeling of Stress : Very much  Social Connections: Moderately Isolated (02/03/2022)   Social Connection and Isolation Panel [NHANES]    Frequency of Communication with Friends and Family: More than three times a week    Frequency of Social Gatherings with Friends and Family: Twice a week    Attends Religious Services: Never    Database administrator or Organizations: No    Attends Engineer, structural: Never    Marital Status: Married   Family History  Problem Relation Age of Onset   Breast cancer Maternal Grandmother    Heart attack Father    Hypertension Father    COPD Mother    Diabetes Mellitus II Brother    Throat cancer Brother    Cerebral aneurysm Sister    Cervical cancer Sister    Cervical cancer Sister    Heart attack Sister    Diabetes Daughter  Asthma Daughter    Cystic fibrosis Daughter    Hypertension Other    Outpatient Encounter Medications as of 07/05/2023  Medication Sig   albuterol (VENTOLIN HFA) 108 (90 Base) MCG/ACT inhaler Inhale 2 puffs into the lungs every 6 (six) hours as needed for wheezing or shortness of breath.   budesonide-formoterol (BREYNA) 80-4.5 MCG/ACT inhaler Inhale 2 puffs into the lungs 2 (two) times daily.   chlorthalidone (HYGROTON) 25 MG tablet Take 1 tablet by mouth once daily   Cyanocobalamin (B-12 PO) Take by mouth daily.   gabapentin (NEURONTIN) 100 MG capsule Take 1 capsule (100 mg total) by mouth at bedtime.   Insulin Pen Needle (BD PEN NEEDLE NANO U/F) 32G X 4 MM MISC 1 each by Does not apply route 4 (four) times daily.   metFORMIN (GLUCOPHAGE) 1000 MG tablet Take 1 tablet (1,000 mg total) by mouth 2 (two) times daily with a meal.   pantoprazole (PROTONIX) 40 MG tablet Take 1 tablet (40 mg total) by mouth daily.   Semaglutide,0.25 or 0.5MG /DOS, (OZEMPIC, 0.25 OR 0.5 MG/DOSE,) 2 MG/3ML SOPN Inject 0.25 mg into the skin once a week.  (Patient not taking: Reported on 07/05/2023)   sertraline (ZOLOFT) 50 MG tablet Take 1 tablet (50 mg total) by mouth daily.   valACYclovir (VALTREX) 500 MG tablet Take 500 mg by mouth 1 day or 1 dose.   Vitamin D, Ergocalciferol, (DRISDOL) 1.25 MG (50000 UNIT) CAPS capsule Take 1 capsule (50,000 Units total) by mouth every 7 (seven) days.   No facility-administered encounter medications on file as of 07/05/2023.   ALLERGIES: Allergies  Allergen Reactions   Fenoprofen Calcium     VACCINATION STATUS: Immunization History  Administered Date(s) Administered   Tdap 05/23/1998, 11/16/2021   Zoster Recombinant(Shingrix) 01/21/2022, 04/22/2022    HPI Norma Horton is 53 y.o. female who presents today with a medical history as above. she is being seen in follow-up after she was seen in consultation for multinodular goiter requested by Gilmore Laroche, FNP.  See notes from previous visit.  She has history of multinodular goiter dating back to 2017.   She did have biopsy of a  right lobe nodule where results were reported to be atypia of undetermined significance.  Her recent repeat thyroid ultrasound in July 2024  did not show significant change in the size of the nodule-6.4 x 4.7 x 3cm. She was sent for repeat FNA after her last visit. Her FNA was sent for Afirma, which resulted in ~50% risk of malignancy. In December 2024, she was called in before her scheduled appointment to dicuss her biopsy results. She was approached for surgical treatment and she agreed for a referral to a Careers adviser.  Unfortunately, the surgeon was not in her network.  She did not call this clinic to inform us.  She is returning with her daughter to help her with her decisions. She presents with intermittent dysphagia and positional shortness of breath.  Patient denies  voice change.   She has no family history of thyroid dysfunction or thyroid malignancy.  She continues to have euthyroid state on thyroid function  tests. Her other medical problems include type 2 diabetes on metformin 1000 mg p.o. twice daily.  Her recent A1c was 7.1%.  She is getting cared for by her PCP.  She has hypertension on treatment with hydrochlorothiazide.   Review of Systems  Constitutional:  No recent weight change, no fatigue, no subjective hyperthermia, no subjective hypothermia Eyes: no blurry vision, no xerophthalmia  Objective:       07/05/2023    9:22 AM 06/05/2023    9:50 AM 04/19/2023    2:52 PM  Vitals with BMI  Height 5\' 3"  5\' 3"  5\' 3"   Weight 347 lbs 3 oz 353 lbs 1 oz 349 lbs 13 oz  BMI 61.52 62.55 61.98  Systolic 110 135 952  Diastolic 82 85 86  Pulse 72 70 76    BP 110/82   Pulse 72   Ht 5\' 3"  (1.6 m)   Wt (!) 347 lb 3.2 oz (157.5 kg)   LMP 08/30/2016   BMI 61.50 kg/m   Wt Readings from Last 3 Encounters:  07/05/23 (!) 347 lb 3.2 oz (157.5 kg)  06/05/23 (!) 353 lb 0.6 oz (160.1 kg)  04/19/23 (!) 349 lb 12.8 oz (158.7 kg)    Physical Exam  Constitutional:  Body mass index is 61.5 kg/m.,  not in acute distress, normal state of mind Eyes: PERRLA, EOMI, no exophthalmos ENT: moist mucous membranes, + gross thyromegaly, no gross cervical lymphadenopathy   CMP ( most recent) CMP     Component Value Date/Time   NA 138 03/03/2023 0915   K 4.6 03/03/2023 0915   CL 101 03/03/2023 0915   CO2 24 03/03/2023 0915   GLUCOSE 118 (H) 03/03/2023 0915   GLUCOSE 135 (H) 11/06/2010 2348   BUN 14 03/03/2023 0915   CREATININE 0.66 03/03/2023 0915   CALCIUM 9.5 03/03/2023 0915   PROT 7.3 03/03/2023 0915   ALBUMIN 4.1 03/03/2023 0915   AST 25 03/03/2023 0915   ALT 36 (H) 03/03/2023 0915   ALKPHOS 97 03/03/2023 0915   BILITOT 0.5 03/03/2023 0915   GFRNONAA >60 11/06/2010 2348   GFRAA >60 11/06/2010 2348    Thyroid ultrasound on November 19, 2015 FINDINGS: Right thyroid lobe   Measurements: 6.8 x 3.8 x 4.5 cm. Right upper pole predominately solid nodule measures 4.9 x 3.7 x 4.5 cm   Left  thyroid lobe : Measurements: 4.9 x 1.5 x 1.4 cm. Heterogeneous tissue without focal  nodule.   Isthmus : Thickness: 0.3 cm.  No nodules visualized.   Lymphadenopathy: None visualized.   IMPRESSION: Solitary large right lobe nodule measures 4.9 cm. Findings meet consensus criteria for biopsy. Ultrasound-guided fine needle aspiration should be considered, as per the consensus statement: Management of Thyroid Nodules Detected at [place]: Society of Radiologists in Ultrasound Consensus Conference Statement. Radiology 2005; [id number].  Final needle aspiration biopsy of thyroid nodules in August 2017 COMMENT: THE SPECIMEN IS HYPERCELLULAR AND CONSISTS OF SMALL AND MEDIUM SIZED GROUPS OF FOLLICULAR  EPITHELIAL CELLS WITH CYTOLOGIC ATYPIA, INCLUDING NUCLEAR ENLARGEMENT AND OVERLAP.    Patient did not want surgery.  Repeat thyroid ultrasound was performed on January 21, 2022 showing   Isthmus: 0.3 cm ,previously 0.3 cm   Right lobe: 7.7 x 3.9 x 4.7 cm ,previously 6.8 x 3.8 x 4.5 cm,    Left lobe: 5.2 x 1.1 x 1.5 cm ,previously 4.9 x 1.5 x 1.4 cm  Nodule in the right lobe of her thyroid size: 6.4 cm; Other 2 dimensions: 4.8 x 3.8 cm, previously,  4.9 x 3.7 x 4.5 cm  FNA 03/20/23 Clinical History: 6.4 cm RML; TI-RADS risk category: TR4 (4-6 pts.)  Specimen Submitted:  A. THYROID, RML, FINE NEEDLE ASPIRATION:    FINAL MICROSCOPIC DIAGNOSIS:  - Atypia of undetermined significance (Bethesda category III)   Afirma: GSC Classifier Oletha Blend Classifier - Suspicious ( ~ 50% risk of malignancy).  Diabetic Labs (  most recent): Lab Results  Component Value Date   HGBA1C 7.1 (H) 03/03/2023   HGBA1C 7.0 (H) 10/31/2022   HGBA1C 7.3 (H) 07/25/2022     Lab Results  Component Value Date   TSH 3.280 03/03/2023   TSH 3.390 03/03/2023   TSH 2.920 10/31/2022   TSH 3.640 08/10/2022   TSH 2.300 07/25/2022   TSH 2.750 04/22/2022   TSH 3.900 01/21/2022   TSH 2.660 11/16/2021   FREET4 1.17  03/03/2023   FREET4 1.16 03/03/2023   FREET4 1.15 10/31/2022   FREET4 1.22 08/10/2022   FREET4 1.28 07/25/2022   FREET4 1.21 04/22/2022   FREET4 1.15 01/21/2022   FREET4 1.14 11/16/2021      Assessment & Plan:   1.  Euthyroid nodular goiter 2.  Morbid obesity   3.  Hypertension   4.  Type 2 diabetes  5. Vitamin D deficiency   - Based on these reviews, she has history of nodular goiter, growing overall, and growing nodule on the right lobe.  This right lobe nodule was previously biopsied with bethesda 3 findings in 2017.  After she was approached for a repeat biopsy of a 6.4 cm nodule in July 2024, she opted to avoid it.  She was re-approached due to the fact that she reported occasional compressive symptoms. Hide biopsy results on March 20, 2023 was consistent with atypia of undetermined significance.  A sample was sent for molecular testing with Afirma gene sequencing classifier which showed approximately 50% risk of malignancy.  After discussing her options, she agreed for total thyroidectomy, however did not get this surgery because the surgeon was out of her network. She failed to notify us and returns for follow-up. She remains a surgical candidate, approached again with her daughter helping her with decision.  She is approached for options of therapy.  She will be sent to [facility] Dr. Lovell Sheehan /Dr. Henreitta Leber.  No gross nodular lesions on left ( contralateral lobe).  She opted for total thyroidectomy. 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  She will be considered for remnant ablation based on her surgical outcomes.   In light of her euthyroid presentation, she will not need intervention with thyroid hormone or antithyroid medications at this time.    -For her diabetes type 2 , hypertension, she is advised to continue on her current medications as well as follow-up with her PCP.   She is advised to continue metformin 1000 mg p.o. twice daily.  She was recently approved for Ozempic, advised to hold until her thyroid surgery.  She is on chlorthalidone 25 mg p.o. daily, and vitamin D2 50,000 units weekly.  She will return for follow-up after her surgery in 8 to 9 weeks.  - she is advised to maintain close follow up with Gilmore Laroche, FNP for primary care needs.    I spent  25  minutes in the care of the patient today including review of labs from Thyroid Function, CMP, and other relevant labs ; imaging/biopsy records (current and previous including abstractions from other facilities); face-to-face time discussing  her lab results and symptoms, medications doses, her options of short and long term treatment based on the latest standards of care / guidelines;   and documenting the encounter.  Norma Horton and her daughter participated in the discussions, expressed understanding, and voiced agreement with the above plans.  All questions were answered to her satisfaction. she is encouraged to  contact clinic should she have any questions or concerns prior to her return visit.   Follow up plan: Return in about 9 weeks (around 09/06/2023) for F/U with Labs after Surgery.   Marquis Lunch, MD [facility] [address] [place], [place] [address] Phone: [phone]  Fax: [phone]     07/05/2023, 10:05 AM  This note was partially dictated with voice recognition software. Similar sounding words can be transcribed inadequately or may not  be corrected upon review.

## 2023-07-05 NOTE — Telephone Encounter (Signed)
 Good Morning! Dr. Lovell Sheehan is out of the office for a few weeks. I have scheduled this patient for consult on 08/10/2023. We will need the CT of her neck obtained prior to this appointment. If the CT has not been performed prior to the appointment, it will delay her scheduling. Thank you.  Per Dr.Jenkins' office.

## 2023-07-11 ENCOUNTER — Telehealth (HOSPITAL_COMMUNITY): Payer: No Typology Code available for payment source | Admitting: Psychiatry

## 2023-07-12 ENCOUNTER — Encounter (HOSPITAL_COMMUNITY): Payer: Self-pay | Admitting: Radiology

## 2023-07-12 ENCOUNTER — Ambulatory Visit (HOSPITAL_COMMUNITY)
Admission: RE | Admit: 2023-07-12 | Discharge: 2023-07-12 | Disposition: A | Discharge status: Home / Self Care | Source: Ambulatory Visit | Attending: "Endocrinology | Admitting: "Endocrinology

## 2023-07-12 DIAGNOSIS — E049 Nontoxic goiter, unspecified: Secondary | ICD-10-CM | POA: Insufficient documentation

## 2023-07-12 DIAGNOSIS — R899 Unspecified abnormal finding in specimens from other organs, systems and tissues: Secondary | ICD-10-CM | POA: Diagnosis present

## 2023-07-12 LAB — POCT I-STAT CREATININE: Creatinine, Ser: 0.8 mg/dL (ref 0.44–1.00)

## 2023-07-12 MED ORDER — IOHEXOL 300 MG/ML  SOLN
75.0000 mL | Freq: Once | INTRAMUSCULAR | Status: AC | PRN
  Administered 2023-07-12: 75 mL via INTRAVENOUS

## 2023-07-18 ENCOUNTER — Telehealth (HOSPITAL_COMMUNITY): Admitting: Psychiatry

## 2023-07-25 ENCOUNTER — Encounter (HOSPITAL_COMMUNITY): Payer: Self-pay | Admitting: Psychiatry

## 2023-07-25 ENCOUNTER — Telehealth (INDEPENDENT_AMBULATORY_CARE_PROVIDER_SITE_OTHER): Admitting: Psychiatry

## 2023-07-25 DIAGNOSIS — F332 Major depressive disorder, recurrent severe without psychotic features: Secondary | ICD-10-CM | POA: Diagnosis not present

## 2023-07-25 DIAGNOSIS — F411 Generalized anxiety disorder: Secondary | ICD-10-CM | POA: Diagnosis not present

## 2023-07-25 DIAGNOSIS — F603 Borderline personality disorder: Secondary | ICD-10-CM

## 2023-07-25 DIAGNOSIS — F4310 Post-traumatic stress disorder, unspecified: Secondary | ICD-10-CM

## 2023-07-25 DIAGNOSIS — F4000 Agoraphobia, unspecified: Secondary | ICD-10-CM

## 2023-07-25 DIAGNOSIS — F5104 Psychophysiologic insomnia: Secondary | ICD-10-CM

## 2023-07-25 MED ORDER — SERTRALINE HCL 50 MG PO TABS: 75.0000 mg | ORAL_TABLET | Freq: Every day | ORAL | 2 refills | Status: DC
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
## 2023-07-25 NOTE — Patient Instructions (Signed)
 We increased the Zoloft to 75 mg daily today.

## 2023-08-10 ENCOUNTER — Encounter: Payer: Self-pay | Admitting: General Surgery

## 2023-08-10 ENCOUNTER — Ambulatory Visit (INDEPENDENT_AMBULATORY_CARE_PROVIDER_SITE_OTHER): Admitting: General Surgery

## 2023-08-10 VITALS — BP 141/90 | HR 66 | Temp 98.1°F | Resp 16 | Ht 63.0 in | Wt 343.0 lb

## 2023-08-10 DIAGNOSIS — E041 Nontoxic single thyroid nodule: Secondary | ICD-10-CM | POA: Diagnosis not present

## 2023-08-11 NOTE — Progress Notes (Signed)
 Norma Horton; [id number]; [birth year]   HPI Patient is a 53 year old white female who was referred to my care by Dr. Fransico Him for evaluation and treatment of a right thyroid nodule.  The right thyroid nodules been present for some time.  A recent biopsy was performed which revealed follicular cells and a 50% chance that a malignancy could be present.  Patient states she does have intermittent dysphagia.  She denies any voice changes.  She denies any heat intolerance.  She has had a history of a multinodular goiter.  She does have some pressure sensation on her trachea when she lies flat. Past Medical History:  Diagnosis Date   Anxiety    Depression    Pt has had suicide ideations in the past.   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    Past Surgical History:  Procedure Laterality Date   BIOPSY THYROID  03/2023   CHOLECYSTECTOMY     TUBAL LIGATION      Family History  Problem Relation Age of Onset   Breast cancer Maternal Grandmother    Heart attack Father    Hypertension Father    COPD Mother    Diabetes Mellitus II Brother    Throat cancer Brother    Cerebral aneurysm Sister    Cervical cancer Sister    Cervical cancer Sister    Heart attack Sister    Diabetes Daughter    Asthma Daughter    Cystic fibrosis Daughter    Hypertension Other     Current Outpatient Medications on File Prior to Visit  Medication Sig Dispense Refill   albuterol (VENTOLIN HFA) 108 (90 Base) MCG/ACT inhaler Inhale 2 puffs into the lungs every 6 (six) hours as needed for wheezing or shortness of breath. 6.7 each 1   budesonide-formoterol (BREYNA) 80-4.5 MCG/ACT inhaler Inhale 2 puffs into the lungs 2 (two) times daily. 10.2 g 3   chlorthalidone (HYGROTON) 25 MG tablet Take 1 tablet by mouth once daily 90 tablet 0   Cyanocobalamin (B-12 PO) Take by mouth daily.     gabapentin (NEURONTIN) 100 MG capsule Take 1 capsule (100 mg total) by mouth at  bedtime. 30 capsule 2   Insulin Pen Needle (BD PEN NEEDLE NANO U/F) 32G X 4 MM MISC 1 each by Does not apply route 4 (four) times daily. 100 each 1   metFORMIN (GLUCOPHAGE) 1000 MG tablet Take 1 tablet (1,000 mg total) by mouth 2 (two) times daily with a meal. 180 tablet 3   pantoprazole (PROTONIX) 40 MG tablet Take 1 tablet (40 mg total) by mouth daily. 60 tablet 1   sertraline (ZOLOFT) 50 MG tablet Take 1.5 tablets (75 mg total) by mouth daily. 45 tablet 2   valACYclovir (VALTREX) 500 MG tablet Take 500 mg by mouth 1 day or 1 dose.     Vitamin D, Ergocalciferol, (DRISDOL) 1.25 MG (50000 UNIT) CAPS capsule Take 1 capsule (50,000 Units total) by mouth every 7 (seven) days. 20 capsule 1   Semaglutide,0.25 or 0.5MG /DOS, (OZEMPIC, 0.25 OR 0.5 MG/DOSE,) 2 MG/3ML SOPN Inject 0.25 mg into the skin once a week. (Patient not taking: Reported on 08/10/2023) 6 mL 1   No current facility-administered medications on file prior to visit.    Allergies  Allergen Reactions   Fenoprofen Calcium     Social History   Substance and Sexual Activity  Alcohol Use Not Currently  Comment: Denied use as of 04/20/2023    Social History   Tobacco Use  Smoking Status Never  Smokeless Tobacco Never    Review of Systems  Constitutional: Negative.   HENT: Negative.    Eyes:  Positive for pain.  Respiratory:  Positive for cough.   Cardiovascular: Negative.   Gastrointestinal:  Positive for heartburn.  Genitourinary:  Positive for frequency.  Musculoskeletal:  Positive for joint pain and neck pain.  Skin: Negative.   Neurological:  Positive for dizziness and sensory change.  Endo/Heme/Allergies: Negative.   Psychiatric/Behavioral: Negative.      Objective   Vitals:   08/10/23 0909  BP: (!) 141/90  Pulse: 66  Resp: 16  Temp: 98.1 F (36.7 C)  SpO2: 92%    Physical Exam Vitals reviewed.  Constitutional:      Appearance: Normal appearance. She is obese. She is not ill-appearing.  HENT:      Head: Normocephalic and atraumatic.  Neck:     Vascular: No carotid bruit.     Comments: Prominent thyroid gland, right greater than left.  Trachea appears midline. Cardiovascular:     Rate and Rhythm: Normal rate and regular rhythm.     Heart sounds: Normal heart sounds. No murmur heard.    No friction rub. No gallop.  Pulmonary:     Effort: Pulmonary effort is normal. No respiratory distress.     Breath sounds: Normal breath sounds. No stridor. No wheezing, rhonchi or rales.  Musculoskeletal:     Cervical back: Neck supple. No rigidity or tenderness.  Lymphadenopathy:     Cervical: No cervical adenopathy.  Skin:    General: Skin is warm and dry.  Neurological:     Mental Status: She is alert and oriented to person, place, and time.   Ultrasound report reviewed, FNA reviewed.  Right thyroid nodule 6.4 cm in size and nearly involving the whole lobe.  Dr. Isidoro Donning notes reviewed.  Assessment  Right thyroid nodule, multinodular goiter Morbid obesity, diabetes mellitus Plan  Patient is scheduled for total thyroidectomy on 08/25/2023.  The risks and benefits of the procedure including bleeding, infection, nerve injury, and the possibility of dysphagia were fully explained to the patient, who gave informed consent.  She does realize she will be on thyroid supplementation for the rest of her life.  She was also be on calcium supplementation postoperatively.

## 2023-08-11 NOTE — H&P (Addendum)
 Norma Horton; [id number]; February 18, 1971   HPI Patient is a 53 year old white female who was referred to my care by Dr. Fransico Him for evaluation and treatment of a right thyroid nodule.  The right thyroid nodules been present for some time.  A recent biopsy was performed which revealed follicular cells and a 50% chance that a malignancy could be present.  Patient states she does have intermittent dysphagia.  She denies any voice changes.  She denies any heat intolerance.  She has had a history of a multinodular goiter.  She does have some pressure sensation on her trachea when she lies flat. Past Medical History:  Diagnosis Date   Anxiety    Depression    Pt has had suicide ideations in the past.   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    Past Surgical History:  Procedure Laterality Date   BIOPSY THYROID  03/2023   CHOLECYSTECTOMY     TUBAL LIGATION      Family History  Problem Relation Age of Onset   Breast cancer Maternal Grandmother    Heart attack Father    Hypertension Father    COPD Mother    Diabetes Mellitus II Brother    Throat cancer Brother    Cerebral aneurysm Sister    Cervical cancer Sister    Cervical cancer Sister    Heart attack Sister    Diabetes Daughter    Asthma Daughter    Cystic fibrosis Daughter    Hypertension Other     Current Outpatient Medications on File Prior to Visit  Medication Sig Dispense Refill   albuterol (VENTOLIN HFA) 108 (90 Base) MCG/ACT inhaler Inhale 2 puffs into the lungs every 6 (six) hours as needed for wheezing or shortness of breath. 6.7 each 1   budesonide-formoterol (BREYNA) 80-4.5 MCG/ACT inhaler Inhale 2 puffs into the lungs 2 (two) times daily. 10.2 g 3   chlorthalidone (HYGROTON) 25 MG tablet Take 1 tablet by mouth once daily 90 tablet 0   Cyanocobalamin (B-12 PO) Take by mouth daily.     gabapentin (NEURONTIN) 100 MG capsule Take 1 capsule (100 mg total) by mouth at  bedtime. 30 capsule 2   Insulin Pen Needle (BD PEN NEEDLE NANO U/F) 32G X 4 MM MISC 1 each by Does not apply route 4 (four) times daily. 100 each 1   metFORMIN (GLUCOPHAGE) 1000 MG tablet Take 1 tablet (1,000 mg total) by mouth 2 (two) times daily with a meal. 180 tablet 3   pantoprazole (PROTONIX) 40 MG tablet Take 1 tablet (40 mg total) by mouth daily. 60 tablet 1   sertraline (ZOLOFT) 50 MG tablet Take 1.5 tablets (75 mg total) by mouth daily. 45 tablet 2   valACYclovir (VALTREX) 500 MG tablet Take 500 mg by mouth 1 day or 1 dose.     Vitamin D, Ergocalciferol, (DRISDOL) 1.25 MG (50000 UNIT) CAPS capsule Take 1 capsule (50,000 Units total) by mouth every 7 (seven) days. 20 capsule 1   Semaglutide,0.25 or 0.5MG /DOS, (OZEMPIC, 0.25 OR 0.5 MG/DOSE,) 2 MG/3ML SOPN Inject 0.25 mg into the skin once a week. (Patient not taking: Reported on 08/10/2023) 6 mL 1   No current facility-administered medications on file prior to visit.    Allergies  Allergen Reactions   Fenoprofen Calcium     Social History   Substance and Sexual Activity  Alcohol Use Not Currently  Comment: Denied use as of 04/20/2023    Social History   Tobacco Use  Smoking Status Never  Smokeless Tobacco Never    Review of Systems  Constitutional: Negative.   HENT: Negative.    Eyes:  Positive for pain.  Respiratory:  Positive for cough.   Cardiovascular: Negative.   Gastrointestinal:  Positive for heartburn.  Genitourinary:  Positive for frequency.  Musculoskeletal:  Positive for joint pain and neck pain.  Skin: Negative.   Neurological:  Positive for dizziness and sensory change.  Endo/Heme/Allergies: Negative.   Psychiatric/Behavioral: Negative.      Objective   Vitals:   08/10/23 0909  BP: (!) 141/90  Pulse: 66  Resp: 16  Temp: 98.1 F (36.7 C)  SpO2: 92%    Physical Exam Vitals reviewed.  Constitutional:      Appearance: Normal appearance. She is obese. She is not ill-appearing.  HENT:      Head: Normocephalic and atraumatic.  Neck:     Vascular: No carotid bruit.     Comments: Prominent thyroid gland, right greater than left.  Trachea appears midline. Cardiovascular:     Rate and Rhythm: Normal rate and regular rhythm.     Heart sounds: Normal heart sounds. No murmur heard.    No friction rub. No gallop.  Pulmonary:     Effort: Pulmonary effort is normal. No respiratory distress.     Breath sounds: Normal breath sounds. No stridor. No wheezing, rhonchi or rales.  Musculoskeletal:     Cervical back: Neck supple. No rigidity or tenderness.  Lymphadenopathy:     Cervical: No cervical adenopathy.  Skin:    General: Skin is warm and dry.  Neurological:     Mental Status: She is alert and oriented to person, place, and time.   Ultrasound report reviewed, FNA reviewed.  Right thyroid nodule 6.4 cm in size and nearly involving the whole lobe.  Dr. Isidoro Donning notes reviewed.  Assessment  Right thyroid nodule, multinodular goiter Morbid obesity, diabetes mellitus Plan  Patient is scheduled for total thyroidectomy on 08/25/2023.  The risks and benefits of the procedure including bleeding, infection, nerve injury, and the possibility of dysphagia were fully explained to the patient, who gave informed consent.  She does realize she will be on thyroid supplementation for the rest of her life.  She will also be on calcium supplements postoperatively.

## 2023-08-22 NOTE — Patient Instructions (Signed)
 Norma Horton  08/22/2023     @PREFPERIOPPHARMACY @   Your procedure is scheduled on  08/25/2023.   Report to [facility] at  0710  A.M.   Call this number if you have problems the morning of surgery:  [phone]  If you experience any cold or flu symptoms such as cough, fever, chills, shortness of breath, etc. between now and your scheduled surgery, please notify us  at the above number.   Remember:         Your last dose of ozempic  should be on 08/17/2023.        DO NOT take any medications for diabetes the morning of your procedure.   Do not eat after midnight.    You may drink clear liquids until  0510 am on 08/25/2023.    Clear liquids allowed are:                    Water, Juice (No red color; non-citric and without pulp; diabetics please choose diet or no sugar options), Carbonated beverages (diabetics please choose diet or no sugar options), Clear Tea (No creamer, milk, or cream, including half & half and powdered creamer), Black Coffee Only (No creamer, milk or cream, including half & half and powdered creamer), and Clear Sports drink (No red color; diabetics please choose diet or no sugar options)    Take these medicines the morning of surgery with A SIP OF WATER                                           pantoprazole , sertraline .    Do not wear jewelry, make-up or nail polish, including gel polish,  artificial nails, or any other type of covering on natural nails (fingers and  toes).  Do not wear lotions, powders, or perfumes, or deodorant.  Do not shave 48 hours prior to surgery.  Men may shave face and neck.  Do not bring valuables to the hospital.  [facility] is not responsible for any belongings or valuables.  Contacts, dentures or bridgework may not be worn into surgery.  Leave your suitcase in the car.  After surgery it may be brought to your room.  For patients admitted to the hospital, discharge time will be determined by your treatment  team.  Patients discharged the day of surgery will not be allowed to drive home and must have someone with them for 24 hours.    Special instructions:   DO NOT smoke tobacco or vape for 24 hours before your procedure.  Please read over the following fact sheets that you were given. Pain Booklet, Coughing and Deep Breathing, Surgical Site Infection Prevention, Anesthesia Post-op Instructions, and Care and Recovery After Surgery        Thyroidectomy, Care After After a thyroidectomy, it is common to have: Mild pain in your neck or upper body. You may feel pain when you swallow. A swollen neck. A weak or hoarse voice. Slight tingling or numbness around your mouth, or in your fingers or toes. This may last for a few days after surgery. It is caused by low levels of calcium . You may be given calcium  supplements to treat it. Follow these instructions at home:  Medicines Take over-the-counter and prescription medicines only as told by your health care provider. If you were given a sedative during the  procedure, it can affect you for several hours. Do not drive or operate machinery until your provider says that it is safe. Talk with your provider before you take any medicines that contain aspirin or NSAIDs, such as ibuprofen. These medicines increase your risk for bleeding. Take a thyroid  hormone medicine as recommended by your provider. If your whole thyroid  was removed, you will need to take this medicine for the rest of your life. Ask your provider if the medicine prescribed to you: Requires you to avoid driving or using machinery. Can cause constipation. You may need to take these actions to prevent or treat constipation: Drink enough fluid to keep your pee (urine) pale yellow. Take over-the-counter or prescription medicines. Eat foods that are high in fiber, such as beans, whole grains, and fresh fruits and vegetables. Limit foods that are high in fat and processed sugars, such as fried  or sweet foods. Incision care Follow instructions from your provider about how to take care of your incision. Make sure you: Wash your hands with soap and water for at least 20 seconds before and after you change your bandage (dressing). If soap and water are not available, use hand sanitizer. Change your dressing as told by your provider. Leave stitches (sutures), skin glue, or tape strips in place. These skin closures may need to stay in place for 2 weeks or longer. If tape strip edges start to loosen and curl up, you may trim the loose edges. Do not remove tape strips completely unless your provider tells you to do that. Check your incision area every day for signs of infection. Check for: More redness, swelling, or pain. More fluid or blood. Warmth. Pus or a bad smell. Activity Rest as told by your provider. Return to your normal activities as told by your provider. Ask your provider what activities are safe for you. You may have to avoid lifting. Ask your provider how much you can safely lift. Do not jog, swim, or do exercises that take a lot of effort until your provider says that it is safe. Do not play contact sports until your provider says that it is safe. Do not sit for a long time without moving. Get up to take short walks every 1-2 hours. This will improve blood flow and breathing. Ask for help if you feel weak or unsteady. General instructions Start slowly with eating. You may need to have only liquids and soft foods for a few days or as told by your provider. Do not use any products that contain nicotine or tobacco. These products include cigarettes, chewing tobacco, and vaping devices, such as e-cigarettes. These can delay healing after surgery. If you need help quitting, ask your provider. Do not take baths, swim, or use a hot tub until your provider approves. Ask your provider if you may take showers. You may only be allowed to take sponge baths. Keep all follow-up visits.  Your provider will need to monitor the calcium  level in your blood to make sure that it does not become low. Your health care provider may give you more instructions. Make sure you know what you can and cannot do. Contact a health care provider if: You have a fever. You have any signs of infection near your incision. You have trouble talking. You have nausea or vomiting that lasts for longer than 2 days. You get a rash. Get help right away if: You have trouble breathing. You have trouble swallowing. You have a cough that gets worse. You  notice that your speech changes. Or you have hoarseness that gets worse. You have numbness, tingling, or sudden muscle tightening (spasms) in your arms, hands, feet, or face. These symptoms may be an emergency. Get help right away. Call 911. Do not wait to see if the symptoms will go away. Do not drive yourself to the hospital. This information is not intended to replace advice given to you by your health care provider. Make sure you discuss any questions you have with your health care provider. Document Revised: 11/22/2021 Document Reviewed: 11/22/2021 Elsevier Patient Education  2024 [employer].General Anesthesia, Adult, Care After The following information offers guidance on how to care for yourself after your procedure. Your health care provider may also give you more specific instructions. If you have problems or questions, contact your health care provider. What can I expect after the procedure? After the procedure, it is common for people to: Have pain or discomfort at the IV site. Have nausea or vomiting. Have a sore throat or hoarseness. Have trouble concentrating. Feel cold or chills. Feel weak, sleepy, or tired (fatigue). Have soreness and body aches. These can affect parts of the body that were not involved in surgery. Follow these instructions at home: For the time period you were told by your health care provider:  Rest. Do not  participate in activities where you could fall or become injured. Do not drive or use machinery. Do not drink alcohol. Do not take sleeping pills or medicines that cause drowsiness. Do not make important decisions or sign legal documents. Do not take care of children on your own. General instructions Drink enough fluid to keep your urine pale yellow. If you have sleep apnea, surgery and certain medicines can increase your risk for breathing problems. Follow instructions from your health care provider about wearing your sleep device: Anytime you are sleeping, including during daytime naps. While taking prescription pain medicines, sleeping medicines, or medicines that make you drowsy. Return to your normal activities as told by your health care provider. Ask your health care provider what activities are safe for you. Take over-the-counter and prescription medicines only as told by your health care provider. Do not use any products that contain nicotine or tobacco. These products include cigarettes, chewing tobacco, and vaping devices, such as e-cigarettes. These can delay incision healing after surgery. If you need help quitting, ask your health care provider. Contact a health care provider if: You have nausea or vomiting that does not get better with medicine. You vomit every time you eat or drink. You have pain that does not get better with medicine. You cannot urinate or have bloody urine. You develop a skin rash. You have a fever. Get help right away if: You have trouble breathing. You have chest pain. You vomit blood. These symptoms may be an emergency. Get help right away. Call 911. Do not wait to see if the symptoms will go away. Do not drive yourself to the hospital. Summary After the procedure, it is common to have a sore throat, hoarseness, nausea, vomiting, or to feel weak, sleepy, or fatigue. For the time period you were told by your health care provider, do not drive or use  machinery. Get help right away if you have difficulty breathing, have chest pain, or vomit blood. These symptoms may be an emergency. This information is not intended to replace advice given to you by your health care provider. Make sure you discuss any questions you have with your health care provider. Document  Revised: 07/16/2021 Document Reviewed: 07/16/2021 Elsevier Patient Education  2024 [employer] Inc[employer] How to Use Chlorhexidine  at Home in the Shower Chlorhexidine  gluconate (CHG) is a germ-killing (antiseptic) wash that's used to clean the skin. It can get rid of the germs that normally live on the skin and can keep them away for about 24 hours. If you're having surgery, you may be told to shower with CHG at home the night before surgery. This can help lower your risk for infection. To use CHG wash in the shower, follow the steps below. Supplies needed: CHG body wash. Clean washcloth. Clean towel. How to use CHG in the shower Follow these steps unless you're told to use CHG in a different way: Start the shower. Use your normal soap and shampoo to wash your face and hair. Turn off the shower or move out of the shower stream. Pour CHG onto a clean washcloth. Do not use any type of brush or rough sponge. Start at your neck, washing your body down to your toes. Make sure you: Wash the part of your body where the surgery will be done for at least 1 minute. Do not scrub. Do not use CHG on your head or face unless your health care provider tells you to. If it gets into your ears or eyes, rinse them well with water. Do not wash your genitals with CHG. Wash your back and under your arms. Make sure to wash skin folds. Let the CHG sit on your skin for 1-2 minutes or as long as told. Rinse your entire body in the shower, including all body creases and folds. Turn off the shower. Dry off with a clean towel. Do not put anything on your skin afterward, such as powder, lotion, or perfume. Put on  clean clothes or pajamas. If it's the night before surgery, sleep in clean sheets. General tips Use CHG only as told, and follow the instructions on the label. Use the full amount of CHG as told. This is often one bottle. Do not smoke and stay away from flames after using CHG. Your skin may feel sticky after using CHG. This is normal. The sticky feeling will go away as the CHG dries. Do not use CHG: If you have a chlorhexidine  allergy or have reacted to chlorhexidine  in the past. On open wounds or areas of skin that have broken skin, cuts, or scrapes. On babies younger than 73 months of age. Contact a health care provider if: You have questions about using CHG. Your skin gets irritated or itchy. You have a rash after using CHG. You swallow any CHG. Call your local poison control center [phone] in the [place] Your eyes itch badly, or they become very red or swollen. Your hearing changes. You have trouble seeing. If you can't reach your provider, go to an urgent care or emergency room. Do not drive yourself. Get help right away if: You have swelling or tingling in your mouth or throat. You make high-pitched whistling sounds when you breathe, most often when you breathe out (wheeze). You have trouble breathing. These symptoms may be an emergency. Call 911 right away. Do not wait to see if the symptoms will go away. Do not drive yourself to the hospital. This information is not intended to replace advice given to you by your health care provider. Make sure you discuss any questions you have with your health care provider. Document Revised: 11/01/2022 Document Reviewed: 10/28/2021 Elsevier Patient Education  2024 [employer] Inc.How to Use an Incentive  Spirometer An incentive spirometer is a tool that measures how well you are filling your lungs with each breath. Learning to take long, deep breaths using this tool can help you keep your lungs clear and active. This may help to reverse or  lessen your chance of developing breathing (pulmonary) problems, especially infection. You may be asked to use a spirometer: After a surgery. If you have a lung problem or a history of smoking. After a long period of time when you have been unable to move or be active. If the spirometer includes an indicator to show the highest number that you have reached, your health care provider or respiratory therapist will help you set a goal. Keep a log of your progress as told by your health care provider. What are the risks? Breathing too quickly may cause dizziness or cause you to pass out. Take your time so you do not get dizzy or light-headed. If you are in pain, you may need to take pain medicine before doing incentive spirometry. It is harder to take a deep breath if you are having pain. How to use your incentive spirometer  Sit up on the edge of your bed or on a chair. Hold the incentive spirometer so that it is in an upright position. Before you use the spirometer, breathe out normally. Place the mouthpiece in your mouth. Make sure your lips are closed tightly around it. Breathe in slowly and as deeply as you can through your mouth, causing the piston or the ball to rise toward the top of the chamber. Hold your breath for 3-5 seconds, or for as long as possible. If the spirometer includes a coach indicator, use this to guide you in breathing. Slow down your breathing if the indicator goes above the marked areas. Remove the mouthpiece from your mouth and breathe out normally. The piston or ball will return to the bottom of the chamber. Rest for a few seconds, then repeat the steps 10 or more times. Take your time and take a few normal breaths between deep breaths so that you do not get dizzy or light-headed. Do this every 1-2 hours when you are awake. If the spirometer includes a goal marker to show the highest number you have reached (best effort), use this as a goal to work toward during each  repetition. After each set of 10 deep breaths, cough a few times. This will help to make sure that your lungs are clear. If you have an incision on your chest or abdomen from surgery, place a pillow or a rolled-up towel firmly against the incision when you cough. This can help to reduce pain while taking deep breaths and coughing. General tips When you are able to get out of bed: Walk around often. Continue to take deep breaths and cough in order to clear your lungs. Keep using the incentive spirometer until your health care provider says it is okay to stop using it. If you have been in the hospital, you may be told to keep using the spirometer at home. Contact a health care provider if: You are having difficulty using the spirometer. You have trouble using the spirometer as often as instructed. Your pain medicine is not giving enough relief for you to use the spirometer as told. You have a fever. Get help right away if: You develop shortness of breath. You develop a cough with bloody mucus from the lungs. You have fluid or blood coming from an incision site after you cough.  Summary An incentive spirometer is a tool that can help you learn to take long, deep breaths to keep your lungs clear and active. You may be asked to use a spirometer after a surgery, if you have a lung problem or a history of smoking, or if you have been inactive for a long period of time. Use your incentive spirometer as instructed every 1-2 hours while you are awake. If you have an incision on your chest or abdomen, place a pillow or a rolled-up towel firmly against your incision when you cough. This will help to reduce pain. Get help right away if you have shortness of breath, you cough up bloody mucus, or blood comes from your incision when you cough. This information is not intended to replace advice given to you by your health care provider. Make sure you discuss any questions you have with your health care  provider. Document Revised: 02/24/2023 Document Reviewed: 02/24/2023 Elsevier Patient Education  2024 [employer].

## 2023-08-23 ENCOUNTER — Ambulatory Visit (HOSPITAL_COMMUNITY)
Admission: RE | Admit: 2023-08-23 | Discharge: 2023-08-23 | Disposition: A | Discharge status: Home / Self Care | Source: Ambulatory Visit | Attending: General Surgery | Admitting: General Surgery

## 2023-08-23 ENCOUNTER — Encounter (HOSPITAL_COMMUNITY): Payer: Self-pay

## 2023-08-23 ENCOUNTER — Encounter (HOSPITAL_COMMUNITY)
Admission: RE | Admit: 2023-08-23 | Discharge: 2023-08-23 | Disposition: A | Discharge status: Home / Self Care | Source: Ambulatory Visit | Attending: General Surgery | Admitting: General Surgery

## 2023-08-23 VITALS — BP 132/94 | HR 66 | Temp 98.1°F | Resp 18 | Ht 63.0 in | Wt 343.0 lb

## 2023-08-23 DIAGNOSIS — Z01818 Encounter for other preprocedural examination: Secondary | ICD-10-CM | POA: Insufficient documentation

## 2023-08-23 DIAGNOSIS — I10 Essential (primary) hypertension: Secondary | ICD-10-CM

## 2023-08-23 DIAGNOSIS — E041 Nontoxic single thyroid nodule: Secondary | ICD-10-CM | POA: Insufficient documentation

## 2023-08-23 DIAGNOSIS — E119 Type 2 diabetes mellitus without complications: Secondary | ICD-10-CM

## 2023-08-23 DIAGNOSIS — E049 Nontoxic goiter, unspecified: Secondary | ICD-10-CM

## 2023-08-23 DIAGNOSIS — Z6841 Body Mass Index (BMI) 40.0 and over, adult: Secondary | ICD-10-CM | POA: Insufficient documentation

## 2023-08-23 HISTORY — DX: Unspecified asthma, uncomplicated: J45.909

## 2023-08-23 LAB — COMPREHENSIVE METABOLIC PANEL WITH GFR
ALT: 34 U/L (ref 0–44)
AST: 30 U/L (ref 15–41)
Albumin: 3.6 g/dL (ref 3.5–5.0)
Alkaline Phosphatase: 83 U/L (ref 38–126)
Anion gap: 12 (ref 5–15)
BUN: 16 mg/dL (ref 6–20)
CO2: 28 mmol/L (ref 22–32)
Calcium: 9.8 mg/dL (ref 8.9–10.3)
Chloride: 97 mmol/L — ABNORMAL LOW (ref 98–111)
Creatinine, Ser: 0.71 mg/dL (ref 0.44–1.00)
GFR, Estimated: >60 mL/min (ref >60)
Glucose, Bld: 131 mg/dL — ABNORMAL HIGH (ref 70–99)
Potassium: 3 mmol/L — ABNORMAL LOW (ref 3.5–5.1)
Sodium: 137 mmol/L (ref 135–145)
Total Bilirubin: 0.9 mg/dL (ref 0.0–1.2)
Total Protein: 7.8 g/dL (ref 6.5–8.1)

## 2023-08-23 LAB — CBC WITH DIFFERENTIAL/PLATELET
Abs Immature Granulocytes: 0.04 10*3/uL (ref 0.00–0.07)
Basophils Absolute: 0.1 10*3/uL (ref 0.0–0.1)
Basophils Relative: 1 %
Eosinophils Absolute: 0.2 10*3/uL (ref 0.0–0.5)
Eosinophils Relative: 2 %
HCT: 44.3 % (ref 36.0–46.0)
Hemoglobin: 14.6 g/dL (ref 12.0–15.0)
Immature Granulocytes: 0 %
Lymphocytes Relative: 30 %
Lymphs Abs: 3.4 10*3/uL (ref 0.7–4.0)
MCH: 30.8 pg (ref 26.0–34.0)
MCHC: 33 g/dL (ref 30.0–36.0)
MCV: 93.5 fL (ref 80.0–100.0)
Monocytes Absolute: 0.8 10*3/uL (ref 0.1–1.0)
Monocytes Relative: 8 %
Neutro Abs: 6.6 10*3/uL (ref 1.7–7.7)
Neutrophils Relative %: 59 %
Platelets: 303 10*3/uL (ref 150–400)
RBC: 4.74 MIL/uL (ref 3.87–5.11)
RDW: 13.3 % (ref 11.5–15.5)
WBC: 11.2 10*3/uL — ABNORMAL HIGH (ref 4.0–10.5)
nRBC: 0 % (ref 0.0–0.2)

## 2023-08-23 LAB — HEMOGLOBIN A1C
Hgb A1c MFr Bld: 7 % — ABNORMAL HIGH (ref 4.8–5.6)
Mean Plasma Glucose: 154.2 mg/dL

## 2023-08-23 LAB — TSH: TSH: 2.494 u[IU]/mL (ref 0.350–4.500)

## 2023-08-25 ENCOUNTER — Inpatient Hospital Stay (HOSPITAL_COMMUNITY)
Admission: AD | Admit: 2023-08-25 | Discharge: 2023-08-27 | DRG: 626 | Disposition: A | Discharge status: Home / Self Care | Attending: General Surgery | Admitting: General Surgery

## 2023-08-25 ENCOUNTER — Ambulatory Visit (HOSPITAL_COMMUNITY): Payer: Self-pay | Admitting: Certified Registered Nurse Anesthetist

## 2023-08-25 ENCOUNTER — Encounter (HOSPITAL_COMMUNITY): Payer: Self-pay | Admitting: General Surgery

## 2023-08-25 ENCOUNTER — Other Ambulatory Visit: Payer: Self-pay

## 2023-08-25 ENCOUNTER — Encounter (HOSPITAL_COMMUNITY)
Admission: AD | Disposition: A | Discharge status: Home / Self Care | Payer: Self-pay | Source: Home / Self Care | Attending: General Surgery

## 2023-08-25 DIAGNOSIS — Z803 Family history of malignant neoplasm of breast: Secondary | ICD-10-CM

## 2023-08-25 DIAGNOSIS — Z9049 Acquired absence of other specified parts of digestive tract: Secondary | ICD-10-CM

## 2023-08-25 DIAGNOSIS — Z8349 Family history of other endocrine, nutritional and metabolic diseases: Secondary | ICD-10-CM

## 2023-08-25 DIAGNOSIS — J45909 Unspecified asthma, uncomplicated: Secondary | ICD-10-CM

## 2023-08-25 DIAGNOSIS — I10 Essential (primary) hypertension: Secondary | ICD-10-CM | POA: Diagnosis not present

## 2023-08-25 DIAGNOSIS — E042 Nontoxic multinodular goiter: Secondary | ICD-10-CM

## 2023-08-25 DIAGNOSIS — Z01818 Encounter for other preprocedural examination: Principal | ICD-10-CM

## 2023-08-25 DIAGNOSIS — R1310 Dysphagia, unspecified: Secondary | ICD-10-CM | POA: Diagnosis present

## 2023-08-25 DIAGNOSIS — Z6841 Body Mass Index (BMI) 40.0 and over, adult: Secondary | ICD-10-CM

## 2023-08-25 DIAGNOSIS — Z833 Family history of diabetes mellitus: Secondary | ICD-10-CM

## 2023-08-25 DIAGNOSIS — F418 Other specified anxiety disorders: Secondary | ICD-10-CM | POA: Diagnosis not present

## 2023-08-25 DIAGNOSIS — F32A Depression, unspecified: Secondary | ICD-10-CM | POA: Diagnosis present

## 2023-08-25 DIAGNOSIS — Z8249 Family history of ischemic heart disease and other diseases of the circulatory system: Secondary | ICD-10-CM

## 2023-08-25 DIAGNOSIS — Z8049 Family history of malignant neoplasm of other genital organs: Secondary | ICD-10-CM

## 2023-08-25 DIAGNOSIS — Z79899 Other long term (current) drug therapy: Secondary | ICD-10-CM

## 2023-08-25 DIAGNOSIS — Z7984 Long term (current) use of oral hypoglycemic drugs: Secondary | ICD-10-CM

## 2023-08-25 DIAGNOSIS — E041 Nontoxic single thyroid nodule: Secondary | ICD-10-CM

## 2023-08-25 DIAGNOSIS — E119 Type 2 diabetes mellitus without complications: Secondary | ICD-10-CM | POA: Diagnosis present

## 2023-08-25 DIAGNOSIS — Z888 Allergy status to other drugs, medicaments and biological substances status: Secondary | ICD-10-CM

## 2023-08-25 DIAGNOSIS — Z9889 Other specified postprocedural states: Secondary | ICD-10-CM

## 2023-08-25 DIAGNOSIS — Z825 Family history of asthma and other chronic lower respiratory diseases: Secondary | ICD-10-CM

## 2023-08-25 DIAGNOSIS — K219 Gastro-esophageal reflux disease without esophagitis: Secondary | ICD-10-CM | POA: Diagnosis present

## 2023-08-25 DIAGNOSIS — C73 Malignant neoplasm of thyroid gland: Principal | ICD-10-CM | POA: Diagnosis present

## 2023-08-25 DIAGNOSIS — Z808 Family history of malignant neoplasm of other organs or systems: Secondary | ICD-10-CM

## 2023-08-25 DIAGNOSIS — Z7985 Long-term (current) use of injectable non-insulin antidiabetic drugs: Secondary | ICD-10-CM

## 2023-08-25 DIAGNOSIS — Z7951 Long term (current) use of inhaled steroids: Secondary | ICD-10-CM

## 2023-08-25 HISTORY — PX: THYROIDECTOMY: SHX17

## 2023-08-25 LAB — COMPREHENSIVE METABOLIC PANEL WITH GFR
ALT: 60 U/L — ABNORMAL HIGH (ref 0–44)
AST: 67 U/L — ABNORMAL HIGH (ref 15–41)
Albumin: 3.4 g/dL — ABNORMAL LOW (ref 3.5–5.0)
Alkaline Phosphatase: 81 U/L (ref 38–126)
Anion gap: 11 (ref 5–15)
BUN: 18 mg/dL (ref 6–20)
CO2: 28 mmol/L (ref 22–32)
Calcium: 8.8 mg/dL — ABNORMAL LOW (ref 8.9–10.3)
Chloride: 96 mmol/L — ABNORMAL LOW (ref 98–111)
Creatinine, Ser: 0.79 mg/dL (ref 0.44–1.00)
GFR, Estimated: >60 mL/min (ref >60)
Glucose, Bld: 238 mg/dL — ABNORMAL HIGH (ref 70–99)
Potassium: 3.3 mmol/L — ABNORMAL LOW (ref 3.5–5.1)
Sodium: 135 mmol/L (ref 135–145)
Total Bilirubin: 0.8 mg/dL (ref 0.0–1.2)
Total Protein: 7.5 g/dL (ref 6.5–8.1)

## 2023-08-25 LAB — GLUCOSE, CAPILLARY
Glucose-Capillary: 146 mg/dL — ABNORMAL HIGH (ref 70–99)
Glucose-Capillary: 180 mg/dL — ABNORMAL HIGH (ref 70–99)
Glucose-Capillary: 233 mg/dL — ABNORMAL HIGH (ref 70–99)
Glucose-Capillary: 180 mg/dL — ABNORMAL HIGH (ref 70–99)

## 2023-08-25 SURGERY — THYROIDECTOMY
Anesthesia: General | Site: Neck

## 2023-08-25 MED ORDER — LIDOCAINE 2% (20 MG/ML) 5 ML SYRINGE
INTRAMUSCULAR | Status: DC | PRN
Start: 2023-08-25 — End: 2023-08-25
  Administered 2023-08-25: 100 mg via INTRAVENOUS

## 2023-08-25 MED ORDER — SODIUM CHLORIDE 0.9 % IV SOLN
3.0000 g | INTRAVENOUS | Status: AC
  Administered 2023-08-25: 3 g via INTRAVENOUS

## 2023-08-25 MED ORDER — SODIUM CHLORIDE 0.9 % IV SOLN
INTRAVENOUS | Status: AC
  Filled 2023-08-25: qty 3

## 2023-08-25 MED ORDER — ROCURONIUM BROMIDE 10 MG/ML (PF) SYRINGE
PREFILLED_SYRINGE | INTRAVENOUS | Status: DC | PRN
  Administered 2023-08-25: 80 mg via INTRAVENOUS

## 2023-08-25 MED ORDER — OXYCODONE HCL 5 MG/5ML PO SOLN: 5.0000 mg | Freq: Once | ORAL | Status: DC | PRN

## 2023-08-25 MED ORDER — SIMETHICONE 80 MG PO CHEW: 40.0000 mg | CHEWABLE_TABLET | Freq: Four times a day (QID) | ORAL | Status: DC | PRN

## 2023-08-25 MED ORDER — HEMOSTATIC AGENTS (NO CHARGE) OPTIME
TOPICAL | Status: DC | PRN
  Administered 2023-08-25: 1

## 2023-08-25 MED ORDER — CHLORHEXIDINE GLUCONATE CLOTH 2 % EX PADS
6.0000 | MEDICATED_PAD | Freq: Once | CUTANEOUS | Status: DC
Start: 2023-08-25 — End: 2023-08-25
  Administered 2023-08-25: 6 via TOPICAL

## 2023-08-25 MED ORDER — SODIUM CHLORIDE 0.9 % IR SOLN
Status: DC | PRN
  Administered 2023-08-25: 1000 mL

## 2023-08-25 MED ORDER — EPHEDRINE 5 MG/ML INJ
INTRAVENOUS | Status: AC
  Filled 2023-08-25: qty 10

## 2023-08-25 MED ORDER — BUPIVACAINE HCL (PF) 0.5 % IJ SOLN
INTRAMUSCULAR | Status: AC
  Filled 2023-08-25: qty 30

## 2023-08-25 MED ORDER — ONDANSETRON 4 MG PO TBDP: 4.0000 mg | ORAL_TABLET | Freq: Four times a day (QID) | ORAL | Status: DC | PRN

## 2023-08-25 MED ORDER — HYDROMORPHONE HCL 1 MG/ML IJ SOLN
INTRAMUSCULAR | Status: AC
  Filled 2023-08-25: qty 0.5

## 2023-08-25 MED ORDER — LACTATED RINGERS IV SOLN: INTRAVENOUS | Status: DC

## 2023-08-25 MED ORDER — DEXAMETHASONE SODIUM PHOSPHATE 10 MG/ML IJ SOLN
INTRAMUSCULAR | Status: DC | PRN
  Administered 2023-08-25: 10 mg via INTRAVENOUS

## 2023-08-25 MED ORDER — MIDAZOLAM HCL 2 MG/2ML IJ SOLN
INTRAMUSCULAR | Status: DC | PRN
  Administered 2023-08-25: 2 mg via INTRAVENOUS

## 2023-08-25 MED ORDER — SUCCINYLCHOLINE CHLORIDE 200 MG/10ML IV SOSY
PREFILLED_SYRINGE | INTRAVENOUS | Status: DC | PRN
  Administered 2023-08-25: 200 mg via INTRAVENOUS

## 2023-08-25 MED ORDER — ALBUTEROL SULFATE (2.5 MG/3ML) 0.083% IN NEBU: 3.0000 mL | INHALATION_SOLUTION | Freq: Four times a day (QID) | RESPIRATORY_TRACT | Status: DC | PRN

## 2023-08-25 MED ORDER — HYDROMORPHONE HCL 1 MG/ML IJ SOLN
INTRAMUSCULAR | Status: DC | PRN
  Administered 2023-08-25: 1 mg via INTRAVENOUS

## 2023-08-25 MED ORDER — ONDANSETRON HCL 4 MG/2ML IJ SOLN
4.0000 mg | Freq: Once | INTRAMUSCULAR | Status: AC | PRN
  Administered 2023-08-25: 4 mg via INTRAVENOUS

## 2023-08-25 MED ORDER — ENOXAPARIN SODIUM 40 MG/0.4ML IJ SOSY
40.0000 mg | PREFILLED_SYRINGE | INTRAMUSCULAR | Status: DC
  Administered 2023-08-26 – 2023-08-27 (×2): 40 mg via SUBCUTANEOUS
  Filled 2023-08-25 (×2): qty 0.4

## 2023-08-25 MED ORDER — FENTANYL CITRATE PF 50 MCG/ML IJ SOSY: 25.0000 ug | PREFILLED_SYRINGE | INTRAMUSCULAR | Status: DC | PRN

## 2023-08-25 MED ORDER — PANTOPRAZOLE SODIUM 40 MG PO TBEC
40.0000 mg | DELAYED_RELEASE_TABLET | Freq: Every day | ORAL | Status: DC
  Administered 2023-08-26 – 2023-08-27 (×2): 40 mg via ORAL
  Filled 2023-08-25 (×2): qty 1

## 2023-08-25 MED ORDER — KCL IN DEXTROSE-NACL 40-5-0.45 MEQ/L-%-% IV SOLN: INTRAVENOUS | Status: DC

## 2023-08-25 MED ORDER — PROPOFOL 10 MG/ML IV BOLUS
INTRAVENOUS | Status: DC | PRN
  Administered 2023-08-25: 200 mg via INTRAVENOUS

## 2023-08-25 MED ORDER — ACETAMINOPHEN 325 MG PO TABS
650.0000 mg | ORAL_TABLET | Freq: Four times a day (QID) | ORAL | Status: DC | PRN
  Administered 2023-08-25: 650 mg via ORAL
  Filled 2023-08-25: qty 2

## 2023-08-25 MED ORDER — BUPIVACAINE HCL (PF) 0.5 % IJ SOLN
INTRAMUSCULAR | Status: DC | PRN
  Administered 2023-08-25: 19 mL

## 2023-08-25 MED ORDER — MIDAZOLAM HCL 2 MG/2ML IJ SOLN
INTRAMUSCULAR | Status: AC
  Filled 2023-08-25: qty 2

## 2023-08-25 MED ORDER — PROPOFOL 10 MG/ML IV BOLUS
INTRAVENOUS | Status: AC
  Filled 2023-08-25: qty 20

## 2023-08-25 MED ORDER — HYDROMORPHONE HCL 1 MG/ML IJ SOLN
1.0000 mg | INTRAMUSCULAR | Status: DC | PRN
  Administered 2023-08-25 – 2023-08-27 (×7): 1 mg via INTRAVENOUS
  Filled 2023-08-25 (×7): qty 1

## 2023-08-25 MED ORDER — DEXAMETHASONE SODIUM PHOSPHATE 10 MG/ML IJ SOLN
INTRAMUSCULAR | Status: AC
  Filled 2023-08-25: qty 2

## 2023-08-25 MED ORDER — FENTANYL CITRATE (PF) 250 MCG/5ML IJ SOLN
INTRAMUSCULAR | Status: DC | PRN
  Administered 2023-08-25: 100 ug via INTRAVENOUS

## 2023-08-25 MED ORDER — INSULIN ASPART 100 UNIT/ML IJ SOLN
0.0000 [IU] | Freq: Three times a day (TID) | INTRAMUSCULAR | Status: DC
  Administered 2023-08-25: 7 [IU] via SUBCUTANEOUS
  Administered 2023-08-26 – 2023-08-27 (×4): 3 [IU] via SUBCUTANEOUS

## 2023-08-25 MED ORDER — SUGAMMADEX SODIUM 200 MG/2ML IV SOLN
INTRAVENOUS | Status: DC | PRN
Start: 2023-08-25 — End: 2023-08-25
  Administered 2023-08-25: 400 mg via INTRAVENOUS

## 2023-08-25 MED ORDER — GABAPENTIN 100 MG PO CAPS
100.0000 mg | ORAL_CAPSULE | Freq: Every day | ORAL | Status: DC
  Administered 2023-08-25 – 2023-08-26 (×2): 100 mg via ORAL
  Filled 2023-08-25 (×2): qty 1

## 2023-08-25 MED ORDER — OXYCODONE HCL 5 MG PO TABS
5.0000 mg | ORAL_TABLET | ORAL | Status: DC | PRN
  Administered 2023-08-26 – 2023-08-27 (×3): 10 mg via ORAL
  Filled 2023-08-25 (×3): qty 2

## 2023-08-25 MED ORDER — DIPHENHYDRAMINE HCL 25 MG PO CAPS: 25.0000 mg | ORAL_CAPSULE | Freq: Four times a day (QID) | ORAL | Status: DC | PRN

## 2023-08-25 MED ORDER — LIDOCAINE 2% (20 MG/ML) 5 ML SYRINGE
INTRAMUSCULAR | Status: AC
  Filled 2023-08-25: qty 10

## 2023-08-25 MED ORDER — METFORMIN HCL 500 MG PO TABS
1000.0000 mg | ORAL_TABLET | Freq: Two times a day (BID) | ORAL | Status: DC
  Administered 2023-08-25 – 2023-08-27 (×4): 1000 mg via ORAL
  Filled 2023-08-25 (×4): qty 2

## 2023-08-25 MED ORDER — CHLORHEXIDINE GLUCONATE 0.12 % MT SOLN: 15.0000 mL | Freq: Once | OROMUCOSAL | Status: DC

## 2023-08-25 MED ORDER — CHLORTHALIDONE 25 MG PO TABS
25.0000 mg | ORAL_TABLET | Freq: Every day | ORAL | Status: DC
  Administered 2023-08-25 – 2023-08-27 (×3): 25 mg via ORAL
  Filled 2023-08-25 (×3): qty 1

## 2023-08-25 MED ORDER — FENTANYL CITRATE (PF) 250 MCG/5ML IJ SOLN
INTRAMUSCULAR | Status: AC
  Filled 2023-08-25: qty 5

## 2023-08-25 MED ORDER — FLUTICASONE FUROATE-VILANTEROL 100-25 MCG/ACT IN AEPB
1.0000 | INHALATION_SPRAY | Freq: Every day | RESPIRATORY_TRACT | Status: DC
  Administered 2023-08-26 – 2023-08-27 (×2): 1 via RESPIRATORY_TRACT
  Filled 2023-08-25: qty 28

## 2023-08-25 MED ORDER — SERTRALINE HCL 50 MG PO TABS
75.0000 mg | ORAL_TABLET | Freq: Every day | ORAL | Status: DC
  Administered 2023-08-26 – 2023-08-27 (×2): 75 mg via ORAL
  Filled 2023-08-25 (×3): qty 1

## 2023-08-25 MED ORDER — PROPOFOL 500 MG/50ML IV EMUL
INTRAVENOUS | Status: AC
Start: 2023-08-25 — End: ?
  Filled 2023-08-25: qty 300

## 2023-08-25 MED ORDER — LACTATED RINGERS IV SOLN: INTRAVENOUS | Status: DC | PRN

## 2023-08-25 MED ORDER — SUCCINYLCHOLINE CHLORIDE 200 MG/10ML IV SOSY
PREFILLED_SYRINGE | INTRAVENOUS | Status: AC
  Filled 2023-08-25: qty 10

## 2023-08-25 MED ORDER — DIPHENHYDRAMINE HCL 50 MG/ML IJ SOLN: 25.0000 mg | Freq: Four times a day (QID) | INTRAMUSCULAR | Status: DC | PRN

## 2023-08-25 MED ORDER — CHLORHEXIDINE GLUCONATE 0.12 % MT SOLN
OROMUCOSAL | Status: AC
Start: 2023-08-25 — End: 2023-08-25
  Filled 2023-08-25: qty 15

## 2023-08-25 MED ORDER — CHLORHEXIDINE GLUCONATE 0.12 % MT SOLN
15.0000 mL | Freq: Once | OROMUCOSAL | Status: AC
  Administered 2023-08-25: 15 mL via OROMUCOSAL

## 2023-08-25 MED ORDER — ROCURONIUM BROMIDE 10 MG/ML (PF) SYRINGE
PREFILLED_SYRINGE | INTRAVENOUS | Status: AC
  Filled 2023-08-25: qty 10

## 2023-08-25 MED ORDER — CHLORHEXIDINE GLUCONATE CLOTH 2 % EX PADS: 6.0000 | MEDICATED_PAD | Freq: Once | CUTANEOUS | Status: DC

## 2023-08-25 MED ORDER — LEVOTHYROXINE SODIUM 100 MCG PO TABS
100.0000 ug | ORAL_TABLET | Freq: Every day | ORAL | Status: DC
  Administered 2023-08-26 – 2023-08-27 (×2): 100 ug via ORAL
  Filled 2023-08-25 (×2): qty 1

## 2023-08-25 MED ORDER — ACETAMINOPHEN 650 MG RE SUPP: 650.0000 mg | Freq: Four times a day (QID) | RECTAL | Status: DC | PRN

## 2023-08-25 MED ORDER — CALCIUM CARBONATE 1250 (500 CA) MG PO TABS
1.0000 | ORAL_TABLET | Freq: Two times a day (BID) | ORAL | Status: DC
  Administered 2023-08-25 – 2023-08-27 (×4): 1250 mg via ORAL
  Filled 2023-08-25 (×4): qty 1

## 2023-08-25 MED ORDER — ONDANSETRON HCL 4 MG/2ML IJ SOLN
INTRAMUSCULAR | Status: AC
Start: 2023-08-25 — End: ?
  Filled 2023-08-25: qty 4

## 2023-08-25 MED ORDER — ORAL CARE MOUTH RINSE: 15.0000 mL | Freq: Once | OROMUCOSAL | Status: DC

## 2023-08-25 MED ORDER — OXYCODONE HCL 5 MG PO TABS: 5.0000 mg | ORAL_TABLET | Freq: Once | ORAL | Status: DC | PRN

## 2023-08-25 MED ORDER — ONDANSETRON HCL 4 MG/2ML IJ SOLN: 4.0000 mg | Freq: Four times a day (QID) | INTRAMUSCULAR | Status: DC | PRN

## 2023-08-25 SURGICAL SUPPLY — 49 items
APPLICATOR CHLORAPREP 10.5 ORG (MISCELLANEOUS) ×1 IMPLANT
ATTRACTOMAT 16X20 MAGNETIC DRP (DRAPES) ×1 IMPLANT
BLADE SURG 15 STRL LF DISP TIS (BLADE) ×1 IMPLANT
CLOTH BEACON ORANGE TIMEOUT ST (SAFETY) ×1 IMPLANT
COUNTER NDL MAGNETIC 40 RED (SET/KITS/TRAYS/PACK) ×1 IMPLANT
COUNTER NEEDLE MAGNETIC 40 RED (SET/KITS/TRAYS/PACK) ×1 IMPLANT
COVER LIGHT HANDLE STERIS (MISCELLANEOUS) ×2 IMPLANT
DERMABOND ADVANCED .7 DNX12 (GAUZE/BANDAGES/DRESSINGS) ×1 IMPLANT
DISSECTOR SURG LIGASURE 21 (MISCELLANEOUS) IMPLANT
DRAIN CHANNEL RND F F (WOUND CARE) IMPLANT
DRAPE HALF SHEET 40X57 (DRAPES) ×2 IMPLANT
DRAPE LAPAROTOMY 77X122 PED (DRAPES) ×1 IMPLANT
ELECT NDL TIP 2.8 STRL (NEEDLE) ×1 IMPLANT
ELECT NEEDLE TIP 2.8 STRL (NEEDLE) ×1 IMPLANT
ELECTRODE REM PT RTRN 9FT ADLT (ELECTROSURGICAL) ×1 IMPLANT
EVACUATOR SILICONE 100CC (DRAIN) IMPLANT
GAUZE 4X4 16PLY ~~LOC~~+RFID DBL (SPONGE) ×2 IMPLANT
GLOVE BIO SURGEON STRL SZ7 (GLOVE) IMPLANT
GLOVE BIOGEL PI IND STRL 6.5 (GLOVE) IMPLANT
GLOVE BIOGEL PI IND STRL 7.0 (GLOVE) ×2 IMPLANT
GLOVE ECLIPSE 6.5 STRL STRAW (GLOVE) IMPLANT
GLOVE SURG SS PI 6.5 STRL IVOR (GLOVE) IMPLANT
GLOVE SURG SS PI 7.5 STRL IVOR (GLOVE) ×2 IMPLANT
GOWN STRL REUS W/TWL LRG LVL3 (GOWN DISPOSABLE) ×3 IMPLANT
HEMOSTAT SURGICEL 4X8 (HEMOSTASIS) ×1 IMPLANT
KIT TURNOVER KIT A (KITS) ×1 IMPLANT
MANIFOLD NEPTUNE II (INSTRUMENTS) ×1 IMPLANT
MARKER SKIN DUAL TIP RULER LAB (MISCELLANEOUS) ×1 IMPLANT
NDL HYPO 21X1.5 SAFETY (NEEDLE) ×1 IMPLANT
NEEDLE HYPO 21X1.5 SAFETY (NEEDLE) ×1 IMPLANT
NS IRRIG 1000ML POUR BTL (IV SOLUTION) ×1 IMPLANT
PACK SRG BSC III STRL LF ECLPS (CUSTOM PROCEDURE TRAY) ×1 IMPLANT
PAD ARMBOARD POSITIONER FOAM (MISCELLANEOUS) ×1 IMPLANT
PENCIL SMOKE EVACUATOR COATED (MISCELLANEOUS) ×1 IMPLANT
SET BASIN LINEN APH (SET/KITS/TRAYS/PACK) ×1 IMPLANT
SHEARS HARMONIC 9CM CVD (BLADE) ×1 IMPLANT
SPONGE DRAIN TRACH 4X4 STRL 2S (GAUZE/BANDAGES/DRESSINGS) ×1 IMPLANT
SPONGE INTESTINAL PEANUT (DISPOSABLE) ×4 IMPLANT
STAPLER SKIN PROX WIDE 3.9 (STAPLE) ×1 IMPLANT
SUT ETHILON 4 0 PS 2 18 (SUTURE) IMPLANT
SUT MNCRL AB 4-0 PS2 18 (SUTURE) ×1 IMPLANT
SUT SILK 3-0 18XBRD TIE 12 (SUTURE) IMPLANT
SUT VIC AB 2-0 CT1 TAPERPNT 27 (SUTURE) IMPLANT
SUT VIC AB 2-0 CT2 27 (SUTURE) ×1 IMPLANT
SUT VIC AB 3-0 SH 27X BRD (SUTURE) ×1 IMPLANT
SYR 30ML LL (SYRINGE) ×2 IMPLANT
SYR BULB IRRIG 60ML STRL (SYRINGE) IMPLANT
TOWEL OR 17X26 4PK STRL BLUE (TOWEL DISPOSABLE) ×1 IMPLANT
YANKAUER SUCT BULB TIP 10FT TU (MISCELLANEOUS) ×1 IMPLANT

## 2023-08-25 NOTE — Transfer of Care (Signed)
 Immediate Anesthesia Transfer of Care Note  Patient: Norma Horton  Procedure(s) Performed: TOTAL THYROIDECTOMY (Neck)  Patient Location: PACU  Anesthesia Type:General  Level of Consciousness: awake, alert , oriented, and patient cooperative  Airway & Oxygen Therapy: Patient Spontanous Breathing and Patient connected to face mask oxygen  Post-op Assessment: Report given to RN, Post -op Vital signs reviewed and stable, and Patient moving all extremities X 4  Post vital signs: Reviewed and stable  Last Vitals:  Vitals Value Taken Time  BP 166/92 08/25/23 1215  Temp 36.7 C 08/25/23 1206  Pulse 80 08/25/23 1218  Resp 13 08/25/23 1218  SpO2 90 % 08/25/23 1218  Vitals shown include unfiled device data.  Last Pain:  Vitals:   08/25/23 1206  TempSrc:   PainSc: Asleep      Patients Stated Pain Goal: 8 (08/25/23 1206)  Complications: No notable events documented.

## 2023-08-25 NOTE — Interval H&P Note (Signed)
 History and Physical Interval Note:  08/25/2023 9:09 AM  Norma Horton  has presented today for surgery, with the diagnosis of NODULAR GOITER ABNORMAL THYROID  BIOPSY.  The various methods of treatment have been discussed with the patient and family. After consideration of risks, benefits and other options for treatment, the patient has consented to  Procedure(s) with comments: THYROIDECTOMY (N/A) - TOTAL as a surgical intervention.  The patient's history has been reviewed, patient examined, no change in status, stable for surgery.  I have reviewed the patient's chart and labs.  Questions were answered to the patient's satisfaction.     Alanda Allegra

## 2023-08-25 NOTE — Plan of Care (Signed)

## 2023-08-25 NOTE — Anesthesia Procedure Notes (Signed)
 Procedure Name: Intubation Date/Time: 08/25/2023 9:47 AM  Performed by: Beverely Buba, CRNAPre-anesthesia Checklist: Patient identified, Emergency Drugs available, Suction available, Patient being monitored and Timeout performed Patient Re-evaluated:Patient Re-evaluated prior to induction Oxygen Delivery Method: Circle system utilized Preoxygenation: Pre-oxygenation with 100% oxygen Induction Type: IV induction Laryngoscope Size: Glidescope and 3 Grade View: Grade I Tube type: Oral Tube size: 7.0 mm Number of attempts: 1 Airway Equipment and Method: Rigid stylet and Video-laryngoscopy Placement Confirmation: ETT inserted through vocal cords under direct vision, positive ETCO2, CO2 detector and breath sounds checked- equal and bilateral Secured at: 22 cm Tube secured with: Tape Dental Injury: Teeth and Oropharynx as per pre-operative assessment

## 2023-08-25 NOTE — Op Note (Signed)
 Patient:  Norma Horton  DOB:  1971-02-23  MRN:  [id number]   Preop Diagnosis: Multinodular goiter, right thyroid  lobe neoplasm  Postop Diagnosis: Same  Procedure: Total thyroidectomy  Surgeon: Alanda Allegra, MD  Assistant: Danna Duster, DO  Anes: General Endotracheal  Indications: Patient is a 53 year old white female with multinodular goiter has a very large right lobe neoplasm which has a 50% chance of being malignancy.  The patient now presents for total thyroidectomy.  The risks and benefits of the procedure including bleeding, infection, voice changes, nerve injury, and the possibility of malignancy were fully explained to the patient, who gave informed consent.  Procedure note: The patient was placed in the supine position.  After induction of general endotracheal anesthesia, the neck was prepped and draped using the usual sterile technique with ChloraPrep.  Surgical site confirmation was performed.  A transverse incision was made approximately 2 cm above the jugular notch.  The platysma was divided using Bovie electrocautery.  An inferior flap was formed to the jugular notch and a superior flap was formed to the larynx.  The strap muscles were divided longitudinally.  I first expose the left lobe of the thyroid  gland.  It was small and without nodularity.  Using the LigaSure, I was able to divide the vessels in the suspensory ligament of Berry in order to rotate the left lobe up towards the midline.  I was able to identify both parathyroid glands on the left side.  I did excise the left lobe off the trachea using the LigaSure.  It was sent to pathology further examination.  We then turned our attention to the right lobe which was significantly enlarged, displacing normal right thyroid  gland tissue.  Again, the LigaSure was used to divide the vasculature, especially the inferior thyroid  artery and vein and the middle thyroidal artery.  The suspensory ligament of Katheryne Pane was also  divided using the LigaSure.  I was able to excise the large mass in the right lobe, but had to go back to further dissect out normal right lobe thyroid  tissue.  Again, care was taken to avoid the recurrent laryngeal nerve as well as the parathyroid glands which were observed.  The specimen was sent to pathology further examination.  And bleeding was controlled using the harmonic scalpel.  The wound was irrigated with normal saline.  Surgicel was placed in both thyroid  lobe beds.  A drain was then placed beneath the strap muscle and brought through separate stab wound to the right of midline.  This was secured at the skin level using a 4-0 nylon interrupted suture the midline strap muscle was reapproximated loosely using a 2 oh locking Vicryl suture.  The platysma was reapproximated using a 2-0 Vicryl running suture.  0.5% Sensorcaine  was instilled into the surrounding wound.  The skin was closed using a 4-0 Monocryl subcuticular suture.  Dermabond was applied.  All tape and needle counts were correct at the end of the procedure.  The patient was extubated in the operating room and went to recovery room in stable condition.  Complications: None  EBL: 300 cc  Specimen: Left lobe of thyroid  gland, right lobe of thyroid  gland  Drains: Small round drain into thyroid  beds

## 2023-08-25 NOTE — Anesthesia Preprocedure Evaluation (Addendum)
 Anesthesia Evaluation  Patient identified by MRN, date of birth, ID band Patient awake    Reviewed: Allergy & Precautions, H&P , NPO status , Patient's Chart, lab work & pertinent test results, reviewed documented beta blocker date and time   Airway Mallampati: II  TM Distance: >3 FB Neck ROM: full    Dental no notable dental hx.    Pulmonary neg pulmonary ROS, asthma    Pulmonary exam normal breath sounds clear to auscultation       Cardiovascular Exercise Tolerance: Good hypertension,  Rhythm:regular Rate:Normal     Neuro/Psych  PSYCHIATRIC DISORDERS Anxiety Depression     Neuromuscular disease negative neurological ROS  negative psych ROS   GI/Hepatic negative GI ROS, Neg liver ROS,GERD  ,,  Endo/Other  diabetes, Type 2  Class 4 obesity  Renal/GU negative Renal ROS  negative genitourinary   Musculoskeletal   Abdominal   Peds  Hematology negative hematology ROS (+)   Anesthesia Other Findings 1. A 5.1 cm mass centered within the right thyroid  lobe, exerting mild mass effect on the larynx and upper trachea, which remains patent. The mass was recently biopsied and is not significantly changed from the prior thyroid  ultrasound dated 11/17/2022. 2. No suspicious cervical lymphadenopathy. 3. Calcified disc-osteophyte complex at C5-6 results in at least moderate spinal canal stenosis at this level.   Reproductive/Obstetrics negative OB ROS                             Anesthesia Physical Anesthesia Plan  ASA: 3  Anesthesia Plan: General and General ETT   Post-op Pain Management:    Induction:   PONV Risk Score and Plan: Ondansetron  and Scopolamine patch - Pre-op  Airway Management Planned:   Additional Equipment:   Intra-op Plan:   Post-operative Plan:   Informed Consent: I have reviewed the patients History and Physical, chart, labs and discussed the procedure including the  risks, benefits and alternatives for the proposed anesthesia with the patient or authorized representative who has indicated his/her understanding and acceptance.     Dental Advisory Given  Plan Discussed with: CRNA  Anesthesia Plan Comments:        Anesthesia Quick Evaluation

## 2023-08-25 NOTE — Anesthesia Postprocedure Evaluation (Signed)
 Anesthesia Post Note  Patient: Norma Horton  Procedure(s) Performed: TOTAL THYROIDECTOMY (Neck)  Patient location during evaluation: Phase II Anesthesia Type: General Level of consciousness: awake Pain management: pain level controlled Vital Signs Assessment: post-procedure vital signs reviewed and stable Respiratory status: spontaneous breathing and respiratory function stable Cardiovascular status: blood pressure returned to baseline and stable Postop Assessment: no headache and no apparent nausea or vomiting Anesthetic complications: no Comments: Late entry   No notable events documented.   Last Vitals:  Vitals:   08/25/23 1420 08/25/23 1445  BP:  (!) 155/86  Pulse:  80  Resp:  17  Temp: 36.4 C 36.8 C  SpO2:  91%    Last Pain:  Vitals:   08/25/23 1617  TempSrc:   PainSc: 6                  Coretha Dew

## 2023-08-26 ENCOUNTER — Encounter (HOSPITAL_COMMUNITY): Payer: Self-pay | Admitting: General Surgery

## 2023-08-26 DIAGNOSIS — Z8349 Family history of other endocrine, nutritional and metabolic diseases: Secondary | ICD-10-CM | POA: Diagnosis not present

## 2023-08-26 DIAGNOSIS — Z8249 Family history of ischemic heart disease and other diseases of the circulatory system: Secondary | ICD-10-CM | POA: Diagnosis not present

## 2023-08-26 DIAGNOSIS — Z8049 Family history of malignant neoplasm of other genital organs: Secondary | ICD-10-CM | POA: Diagnosis not present

## 2023-08-26 DIAGNOSIS — Z888 Allergy status to other drugs, medicaments and biological substances status: Secondary | ICD-10-CM | POA: Diagnosis not present

## 2023-08-26 DIAGNOSIS — Z9049 Acquired absence of other specified parts of digestive tract: Secondary | ICD-10-CM | POA: Diagnosis not present

## 2023-08-26 DIAGNOSIS — Z803 Family history of malignant neoplasm of breast: Secondary | ICD-10-CM | POA: Diagnosis not present

## 2023-08-26 DIAGNOSIS — Z7985 Long-term (current) use of injectable non-insulin antidiabetic drugs: Secondary | ICD-10-CM | POA: Diagnosis not present

## 2023-08-26 DIAGNOSIS — C73 Malignant neoplasm of thyroid gland: Secondary | ICD-10-CM | POA: Diagnosis present

## 2023-08-26 DIAGNOSIS — Z833 Family history of diabetes mellitus: Secondary | ICD-10-CM | POA: Diagnosis not present

## 2023-08-26 DIAGNOSIS — Z7984 Long term (current) use of oral hypoglycemic drugs: Secondary | ICD-10-CM | POA: Diagnosis not present

## 2023-08-26 DIAGNOSIS — K219 Gastro-esophageal reflux disease without esophagitis: Secondary | ICD-10-CM | POA: Diagnosis present

## 2023-08-26 DIAGNOSIS — I10 Essential (primary) hypertension: Secondary | ICD-10-CM | POA: Diagnosis present

## 2023-08-26 DIAGNOSIS — Z825 Family history of asthma and other chronic lower respiratory diseases: Secondary | ICD-10-CM | POA: Diagnosis not present

## 2023-08-26 DIAGNOSIS — Z808 Family history of malignant neoplasm of other organs or systems: Secondary | ICD-10-CM | POA: Diagnosis not present

## 2023-08-26 DIAGNOSIS — Z7951 Long term (current) use of inhaled steroids: Secondary | ICD-10-CM | POA: Diagnosis not present

## 2023-08-26 DIAGNOSIS — F32A Depression, unspecified: Secondary | ICD-10-CM | POA: Diagnosis present

## 2023-08-26 DIAGNOSIS — E119 Type 2 diabetes mellitus without complications: Secondary | ICD-10-CM | POA: Diagnosis present

## 2023-08-26 DIAGNOSIS — Z79899 Other long term (current) drug therapy: Secondary | ICD-10-CM | POA: Diagnosis not present

## 2023-08-26 DIAGNOSIS — Z6841 Body Mass Index (BMI) 40.0 and over, adult: Secondary | ICD-10-CM | POA: Diagnosis not present

## 2023-08-26 DIAGNOSIS — R1310 Dysphagia, unspecified: Secondary | ICD-10-CM | POA: Diagnosis present

## 2023-08-26 DIAGNOSIS — E042 Nontoxic multinodular goiter: Secondary | ICD-10-CM | POA: Diagnosis present

## 2023-08-26 LAB — CBC
HCT: 39.9 % (ref 36.0–46.0)
Hemoglobin: 12.5 g/dL (ref 12.0–15.0)
MCH: 30.4 pg (ref 26.0–34.0)
MCHC: 31.3 g/dL (ref 30.0–36.0)
MCV: 97.1 fL (ref 80.0–100.0)
Platelets: 269 10*3/uL (ref 150–400)
RBC: 4.11 MIL/uL (ref 3.87–5.11)
RDW: 13.6 % (ref 11.5–15.5)
WBC: 14.1 10*3/uL — ABNORMAL HIGH (ref 4.0–10.5)
nRBC: 0 % (ref 0.0–0.2)

## 2023-08-26 LAB — GLUCOSE, CAPILLARY
Glucose-Capillary: 143 mg/dL — ABNORMAL HIGH (ref 70–99)
Glucose-Capillary: 141 mg/dL — ABNORMAL HIGH (ref 70–99)
Glucose-Capillary: 126 mg/dL — ABNORMAL HIGH (ref 70–99)
Glucose-Capillary: 141 mg/dL — ABNORMAL HIGH (ref 70–99)

## 2023-08-26 LAB — COMPREHENSIVE METABOLIC PANEL WITH GFR
ALT: 46 U/L — ABNORMAL HIGH (ref 0–44)
AST: 38 U/L (ref 15–41)
Albumin: 3 g/dL — ABNORMAL LOW (ref 3.5–5.0)
Alkaline Phosphatase: 65 U/L (ref 38–126)
Anion gap: 11 (ref 5–15)
BUN: 16 mg/dL (ref 6–20)
CO2: 31 mmol/L (ref 22–32)
Calcium: 8.8 mg/dL — ABNORMAL LOW (ref 8.9–10.3)
Chloride: 96 mmol/L — ABNORMAL LOW (ref 98–111)
Creatinine, Ser: 0.65 mg/dL (ref 0.44–1.00)
GFR, Estimated: >60 mL/min (ref >60)
Glucose, Bld: 166 mg/dL — ABNORMAL HIGH (ref 70–99)
Potassium: 3.8 mmol/L (ref 3.5–5.1)
Sodium: 138 mmol/L (ref 135–145)
Total Bilirubin: 0.6 mg/dL (ref 0.0–1.2)
Total Protein: 6.8 g/dL (ref 6.5–8.1)

## 2023-08-26 MED ORDER — MENTHOL 3 MG MT LOZG: 1.0000 | LOZENGE | OROMUCOSAL | Status: DC | PRN

## 2023-08-26 NOTE — Plan of Care (Signed)

## 2023-08-26 NOTE — Plan of Care (Signed)
   Problem: Education: Goal: Knowledge of General Education information will improve Description: Including pain rating scale, medication(s)/side effects and non-pharmacologic comfort measures Outcome: Progressing   Problem: Clinical Measurements: Goal: Ability to maintain clinical measurements within normal limits will improve Outcome: Progressing

## 2023-08-26 NOTE — Progress Notes (Signed)
 1 Day Post-Op  Subjective: Patient having moderate incisional pain.  She does feel she has a scratchy voice.  She has minimal dysphagia.  Objective: Vital signs in last 24 hours: Temp:  [97.5 F (36.4 C)-98.2 F (36.8 C)] 97.9 F (36.6 C) (04/26 0456) Pulse Rate:  [67-85] 70 (04/26 0456) Resp:  [11-24] 16 (04/26 0456) BP: (108-185)/(63-94) 130/66 (04/26 0456) SpO2:  [89 %-100 %] 100 % (04/26 0721) FiO2 (%):  [28 %] 28 % (04/25 1206) Last BM Date : 08/25/23  Intake/Output from previous day: 04/25 0701 - 04/26 0700 In: 1584.7 [P.O.:240; I.V.:1244.7; IV Piggyback:100] Out: 360 [Drains:60; Blood:300] Intake/Output this shift: Total I/O In: 240 [P.O.:240] Out: -   General appearance: alert, cooperative, and fatigued Neck: Incision healing well.  No hematoma present.  Drain in place.  Sanguinous drainage noted. Resp: clear to auscultation bilaterally Cardio: regular rate and rhythm, S1, S2 normal, no murmur, click, rub or gallop  Lab Results:  Recent Labs    08/23/23 1356 08/26/23 0442  WBC 11.2* 14.1*  HGB 14.6 12.5  HCT 44.3 39.9  PLT 303 269   BMET Recent Labs    08/25/23 1638 08/26/23 0442  NA 135 138  K 3.3* 3.8  CL 96* 96*  CO2 28 31  GLUCOSE 238* 166*  BUN 18 16  CREATININE 0.79 0.65  CALCIUM  8.8* 8.8*   PT/INR No results for input(s): "LABPROT", "INR" in the last 72 hours.  Studies/Results: No results found.  Anti-infectives: Anti-infectives (From admission, onward)    Start     Dose/Rate Route Frequency Ordered Stop   08/25/23 0800  ceFAZolin  (ANCEF ) 3 g in sodium chloride  0.9 % 100 mL IVPB        3 g 200 mL/hr over 30 Minutes Intravenous On call to O.R. 08/25/23 0746 08/25/23 1606   08/25/23 0745  sodium chloride  0.9 % with ceFAZolin  (ANCEF ) ADS Med       Note to Pharmacy: Gabino Joe S: cabinet override      08/25/23 0745 08/25/23 1022       Assessment/Plan: s/p Procedure(s): TOTAL THYROIDECTOMY Impression: Patient having moderate  incisional pain and complains of a scratchy throat.  She is able to phonate the E without difficulty.  I have put her on a soft diet.  She has been started on Synthroid and Os-Cal. Plan: Will keep patient for another 24 hours to help control her pain.  Will plan on removing drain tomorrow.  LOS: 0 days    Alanda Allegra 08/26/2023

## 2023-08-27 LAB — CBC
HCT: 40.7 % (ref 36.0–46.0)
Hemoglobin: 12.6 g/dL (ref 12.0–15.0)
MCH: 30.4 pg (ref 26.0–34.0)
MCHC: 31 g/dL (ref 30.0–36.0)
MCV: 98.1 fL (ref 80.0–100.0)
Platelets: 254 10*3/uL (ref 150–400)
RBC: 4.15 MIL/uL (ref 3.87–5.11)
RDW: 13.7 % (ref 11.5–15.5)
WBC: 10.4 10*3/uL (ref 4.0–10.5)
nRBC: 0 % (ref 0.0–0.2)

## 2023-08-27 LAB — COMPREHENSIVE METABOLIC PANEL WITH GFR
ALT: 53 U/L — ABNORMAL HIGH (ref 0–44)
AST: 53 U/L — ABNORMAL HIGH (ref 15–41)
Albumin: 3.2 g/dL — ABNORMAL LOW (ref 3.5–5.0)
Alkaline Phosphatase: 69 U/L (ref 38–126)
Anion gap: 11 (ref 5–15)
BUN: 14 mg/dL (ref 6–20)
CO2: 30 mmol/L (ref 22–32)
Calcium: 8.6 mg/dL — ABNORMAL LOW (ref 8.9–10.3)
Chloride: 95 mmol/L — ABNORMAL LOW (ref 98–111)
Creatinine, Ser: 0.58 mg/dL (ref 0.44–1.00)
GFR, Estimated: >60 mL/min (ref >60)
Glucose, Bld: 145 mg/dL — ABNORMAL HIGH (ref 70–99)
Potassium: 3.3 mmol/L — ABNORMAL LOW (ref 3.5–5.1)
Sodium: 136 mmol/L (ref 135–145)
Total Bilirubin: 0.8 mg/dL (ref 0.0–1.2)
Total Protein: 7 g/dL (ref 6.5–8.1)

## 2023-08-27 LAB — GLUCOSE, CAPILLARY: Glucose-Capillary: 134 mg/dL — ABNORMAL HIGH (ref 70–99)

## 2023-08-27 MED ORDER — OXYCODONE HCL 5 MG PO TABS: 5.0000 mg | ORAL_TABLET | ORAL | 0 refills | Status: DC | PRN

## 2023-08-27 MED ORDER — CALCIUM CARBONATE 1250 (500 CA) MG PO TABS: 1.0000 | ORAL_TABLET | Freq: Two times a day (BID) | ORAL | 1 refills | Status: AC

## 2023-08-27 MED ORDER — LEVOTHYROXINE SODIUM 100 MCG PO TABS: 100.0000 ug | ORAL_TABLET | Freq: Every day | ORAL | 1 refills | Status: DC

## 2023-08-27 NOTE — Progress Notes (Signed)
 Discharge paperwork explained and iv taken out. Patient is currently waiting on a ride to leave. Respiratory therapist went in the patient's room to give treatment. O2 saturation on RA was 83%. Respiratory placed patient back on supplemental O2. Patient did receive IV dilaudid  recently. MD Larrie Po aware.

## 2023-08-27 NOTE — Plan of Care (Signed)
 Rested well during the shift. Received one dose of dilaudid  and one dose of oxycodone p.o. during the shift and did have relief. Drainage noted around insertion site of JP drain only when patient stands to do to the bedside commode. Area wiped with sterile gauze.  Problem: Education: Goal: Knowledge of General Education information will improve Description: Including pain rating scale, medication(s)/side effects and non-pharmacologic comfort measures Outcome: Progressing   Problem: Health Behavior/Discharge Planning: Goal: Ability to manage health-related needs will improve Outcome: Progressing   Problem: Clinical Measurements: Goal: Ability to maintain clinical measurements within normal limits will improve Outcome: Progressing Goal: Will remain free from infection Outcome: Progressing Goal: Diagnostic test results will improve Outcome: Progressing Goal: Respiratory complications will improve Outcome: Progressing Goal: Cardiovascular complication will be avoided Outcome: Progressing   Problem: Activity: Goal: Risk for activity intolerance will decrease Outcome: Progressing   Problem: Nutrition: Goal: Adequate nutrition will be maintained Outcome: Progressing   Problem: Coping: Goal: Level of anxiety will decrease Outcome: Progressing   Problem: Elimination: Goal: Will not experience complications related to bowel motility Outcome: Progressing Goal: Will not experience complications related to urinary retention Outcome: Progressing   Problem: Pain Managment: Goal: General experience of comfort will improve and/or be controlled Outcome: Progressing   Problem: Safety: Goal: Ability to remain free from injury will improve Outcome: Progressing   Problem: Skin Integrity: Goal: Risk for impaired skin integrity will decrease Outcome: Progressing   Problem: Education: Goal: Ability to describe self-care measures that may prevent or decrease complications (Diabetes  Survival Skills Education) will improve Outcome: Progressing Goal: Individualized Educational Video(s) Outcome: Progressing   Problem: Coping: Goal: Ability to adjust to condition or change in health will improve Outcome: Progressing   Problem: Fluid Volume: Goal: Ability to maintain a balanced intake and output will improve Outcome: Progressing   Problem: Health Behavior/Discharge Planning: Goal: Ability to identify and utilize available resources and services will improve Outcome: Progressing Goal: Ability to manage health-related needs will improve Outcome: Progressing   Problem: Metabolic: Goal: Ability to maintain appropriate glucose levels will improve Outcome: Progressing   Problem: Nutritional: Goal: Maintenance of adequate nutrition will improve Outcome: Progressing Goal: Progress toward achieving an optimal weight will improve Outcome: Progressing   Problem: Skin Integrity: Goal: Risk for impaired skin integrity will decrease Outcome: Progressing   Problem: Tissue Perfusion: Goal: Adequacy of tissue perfusion will improve Outcome: Progressing

## 2023-08-27 NOTE — Discharge Summary (Signed)
 Physician Discharge Summary  Patient ID: Norma Horton MRN: [id number] DOB/AGE: November 26, 1970 53 y.o.  Admit date: 08/25/2023 Discharge date: 08/27/2023  Admission Diagnoses: Right thyroid  lobe neoplasm  Discharge Diagnoses:  Principal Problem:   S/P total thyroidectomy Active Problems:   Multinodular goiter   Solitary nodule of right lobe of thyroid  Morbid obesity, non-insulin -dependent diabetes mellitus  Discharged Condition: good  Hospital Course: Patient is a 53 year old white female who underwent a total thyroidectomy on 08/25/2023 for a large right thyroid  lobe neoplasm.  Final pathology is pending.  She tolerated the surgery well.  Her postoperative course was remarkable only for incisional pain.  She had minimal drainage from a surgical drain which was removed on 08/27/2023.  Minimal dysphagia.  She was able to phonate the letter E.  Her calcium  levels remained within normal limits postoperatively.  She is being discharged home on 08/27/2023 in good and improving condition.  Treatments: surgery: Total thyroidectomy on 08/25/2023  Discharge Exam: Blood pressure (!) 151/81, pulse 82, temperature 97.6 F (36.4 C), temperature source Oral, resp. rate (!) 22, height 5\' 3"  (1.6 m), weight (!) 155.6 kg, last menstrual period 08/30/2016, SpO2 96%. General appearance: alert, cooperative, and no distress Neck: Incision healing well.  No swelling present. Resp: clear to auscultation bilaterally Cardio: regular rate and rhythm, S1, S2 normal, no murmur, click, rub or gallop  Disposition: Discharge disposition: 01-Home or Self Care       Discharge Instructions     Diet - low sodium heart healthy   Complete by: As directed    Increase activity slowly   Complete by: As directed       Allergies as of 08/27/2023       Reactions   Fenoprofen Calcium          Medication List     TAKE these medications    albuterol  108 (90 Base) MCG/ACT inhaler Commonly known as: VENTOLIN   HFA Inhale 2 puffs into the lungs every 6 (six) hours as needed for wheezing or shortness of breath.   B-12 PO Take 1 tablet by mouth daily.   BD Pen Needle Nano U/F 32G X 4 MM Misc Generic drug: Insulin  Pen Needle 1 each by Does not apply route 4 (four) times daily.   budesonide -formoterol  80-4.5 MCG/ACT inhaler Commonly known as: Breyna  Inhale 2 puffs into the lungs 2 (two) times daily.   calcium  carbonate 1250 (500 Ca) MG tablet Commonly known as: OS-CAL - dosed in mg of elemental calcium  Take 1 tablet (1,250 mg total) by mouth 2 (two) times daily with a meal.   chlorthalidone  25 MG tablet Commonly known as: HYGROTON  Take 1 tablet by mouth once daily   gabapentin  100 MG capsule Commonly known as: NEURONTIN  Take 1 capsule (100 mg total) by mouth at bedtime.   levothyroxine 100 MCG tablet Commonly known as: SYNTHROID Take 1 tablet (100 mcg total) by mouth daily at 6 (six) AM. Start taking on: August 28, 2023   metFORMIN  1000 MG tablet Commonly known as: GLUCOPHAGE  Take 1 tablet (1,000 mg total) by mouth 2 (two) times daily with a meal.   oxyCODONE 5 MG immediate release tablet Commonly known as: Roxicodone Take 1 tablet (5 mg total) by mouth every 4 (four) hours as needed.   Ozempic  (0.25 or 0.5 MG/DOSE) 2 MG/3ML Sopn Generic drug: Semaglutide (0.25 or 0.5MG /DOS) Inject 0.25 mg into the skin once a week.   pantoprazole  40 MG tablet Commonly known as: PROTONIX  Take 1 tablet (40 mg total) by mouth  daily.   sertraline  50 MG tablet Commonly known as: ZOLOFT  Take 1.5 tablets (75 mg total) by mouth daily.   valACYclovir 500 MG tablet Commonly known as: VALTREX Take 500 mg by mouth daily.   Vitamin D  (Ergocalciferol ) 1.25 MG (50000 UNIT) Caps capsule Commonly known as: DRISDOL  Take 1 capsule (50,000 Units total) by mouth every 7 (seven) days.        Follow-up Information     Alanda Allegra, MD. Schedule an appointment as soon as possible for a visit on 08/31/2023.    Specialty: General Surgery Contact information: [id number] [place] [address] [phone]                 Signed: Alanda Allegra 08/27/2023, 8:14 AM

## 2023-08-30 LAB — SURGICAL PATHOLOGY

## 2023-08-31 ENCOUNTER — Ambulatory Visit (INDEPENDENT_AMBULATORY_CARE_PROVIDER_SITE_OTHER): Admitting: General Surgery

## 2023-08-31 ENCOUNTER — Encounter: Payer: Self-pay | Admitting: General Surgery

## 2023-08-31 VITALS — BP 155/96 | HR 63 | Temp 98.0°F | Resp 18 | Ht 63.0 in | Wt 336.0 lb

## 2023-08-31 DIAGNOSIS — Z09 Encounter for follow-up examination after completed treatment for conditions other than malignant neoplasm: Secondary | ICD-10-CM

## 2023-08-31 MED ORDER — OXYCODONE HCL 5 MG PO TABS: 5.0000 mg | ORAL_TABLET | ORAL | 0 refills | Status: DC | PRN

## 2023-08-31 NOTE — Patient Instructions (Signed)
 Get labs at [facility] the week prior to appointment with Dr.Nida~ Thursday November 02, 2023 @ 9AM

## 2023-08-31 NOTE — Progress Notes (Signed)
 Subjective:     Norma Horton  Patient here for postoperative visit, status post total thyroidectomy.  Patient still has a little bit of dysphagia and a cough.  Her voice has mostly returned to her normal preoperative state.  She denies any twitching around the mouth or hands.  She is taking her Synthroid  and calcium  supplements.  She is having moderate incisional pain. Objective:    BP (!) 155/96   Pulse 63   Temp 98 F (36.7 C) (Oral)   Resp 18   Ht 5\' 3"  (1.6 m)   Wt (!) 336 lb (152.4 kg)   LMP 08/30/2016   SpO2 93%   BMI 59.52 kg/m   General:  alert, cooperative, and no distress  Neck: Incision healing well.  Normal fullness beneath the incision from the platysma being reattached.  No hematoma present.    Results Surgical pathology (Order [id number]) MyChart Results Release  MyChart Status: Active  Results Release   Surgical pathology Order: [id number]  Status: Edited Result - FINAL     Next appt: 09/14/2023 at 09:45 AM in General Surgery Alanda Allegra, MD)   Test Result Released: Yes (not seen)   0 Result Notes    Component Ref Range & Units (hover) 6 d ago  SURGICAL PATHOLOGY SURGICAL PATHOLOGY CASE: [id number] PATIENT: Norma Horton Surgical Pathology Report     Clinical History: Nodular goiter abnormal thyroid  biopsy (crm)     FINAL MICROSCOPIC DIAGNOSIS:  A. THYROID , LEFT, LOBECTOMY: - Thyroid  follicular hyperplasia/adenomatous nodule - No malignancy identified  B. THYROID , RIGHT, LOBECTOMY: - Papillary thyroid  carcinoma, classic type, 0.9 cm - Tumor is present at inked edge - No lymphovascular invasion identified - Thyroid  follicular nodular disease/adenomatous nodules - See oncology table  ONCOLOGY TABLE:    THYROID  GLAND, CARCINOMA: Resection  Procedure: Lobectomy Tumor Focality: Unifocal Tumor Site: Right lobe Tumor Size: 0.9 cm Histologic Type: Papillary thyroid  carcinoma, classic subtype Angioinvasion: Not  identified Lymphatic Invasion: Not identified Extrathyroidal Extension: Not identified Margin Status:      Margin(s) Involved by Carcinoma: Tumor present at inked edge Regional Lymph Node Status: Not applicable (no regional lymph nodes submitted or found) Distant Metastasis:      Distant Site(s) Involved: Not applicable Pathologic Stage Classification (pTNM, AJCC 8th Edition): pT1a, pN not assigned Ancillary Studies: Can be performed upon request Representative Tumor Block: B2 Comment(s): None (v4.3.0.0)     GROSS DESCRIPTION:  A: Specimen: Received in formalin labeled thyroid  gland left lobe Specimen integrity: Intact, without clinical orientation Size and weight: 3.5 x 2.9 x 2.2 cm, 6.5 g External surface: Red-brown, slightly roughened, with mild adhesions and cautery artifact on the posterior aspect.  The presumed isthmus resection margin is inked yellow, and the remaining specimen is inked black. Parenchyma: sectioning reveals red-brown grossly unremarkable cut surfaces. Block Summary: Representative sections are submitted in 3 cassettes.  B: Specimen: Received in formalin labeled thyroid  gland right lobe Specimen integrity: Disrupted, without clinical orientation Size and weight: Thyroid  gland is disrupted, and measures 5.0 x 3.7 x 2.7 cm in aggregate.  A separate focally disrupted nodule is present, measuring 7.0 x 5.0 x 3.9 cm.  The thyroid  and separate nodule have a combined weight of 66 g. External surface: The actual thyroid  tissue is disrupted, with an area of red-brown, flattened tissue identified suggestive of surface overlying the separate nodule.  The presumed outer surface of the thyroid  gland is inked black.  The outer surface of the separate nodule is disrupted, with a minimal  amount of possible thyroid  tissue present. The outer surface is inked blue. Lesion: Sectioning through the large separate nodule reveals a tan-pink, focally calcified glistening  cut surface.  Identified within the thyroid  parenchyma is a 0.8 cm in greatest dimension tan-pink glistening, well-circumscribed nodule.  Within the smaller portion of thyroid  parenchyma is a 0.9 cm in greatest dimension tan-white, focally calcified nodule. Uninvolved parenchyma: Red-brown Block Summary: 23 blocks submitted 1 = tan-pink nodule within thyroid  parenchyma 2 = tan-white nodule within thyroid  parenchyma, following decalcification 3-23 = large separate nodule to include entire capsule (block 3 submitted following decalcification)   Jeffrey Mini 08/25/2023)    Final Diagnosis performed by Dagmar Drones, MD.   Electronically signed 08/30/2023 Technical component performed at [facility], [address], [place], [place] [address].  Professional component performed at [facility], [address] [place], [place] [address].  Immunohistochemistry Technical component (if applicable) was performed at [facility]. [address], [place], [place] [address].   IMMUNOHISTOCHEMISTRY DISCLAIMER (if applicable): Some of these immunohistochemical stains may have been developed and the performance characteristics determine by [facility]. Some may not have been cleared or approved by the U.S. Food and Drug Administration. The FDA has determined that such clearance or approval is not necessary. This test is used for clinical purposes. It should not be regarded as investigational or for research. This laboratory is certified under the Clinical Laboratory Improvement Amendments of 1988 (CLIA-88) as qualified to perform high complexity clinical laboratory testing.  The controls stained appropriately.   IHC stains are performed on formalin fixed, paraffin embedded tissue using a 3,3"diaminobenzidine (DAB) chromogen and Leica Bond Autostainer System. The staining intensity of the nucleus is score manually and is  reported as the percentage of tumor cell nuclei demonstrating specific nuclear staining. The specimens are fixed in 10% Neutral Formalin for at least 6 hours and up to 72hrs. These tests are validated on decalcified tissue. Results should be interpreted with caution given the possibility of false negative results on decalcified specimens. Antibody Clones are as follows ER-clone [id number], PR-clone 16, Ki67- clone MM1. Some of these immunohistochemical stains may have been developed and the performance characteristics determined by [facility].  Resulting Agency [facility]        Specimen Collected: 08/25/23 10:42 Last Resulted: 08/30/23 14:34        Assessment:    Doing well postoperatively. Moderate incisional pain.  She does have a cough which may be secondary to sinus drainage.    Plan:   I have reordered her oxycodone  for pain.  She will take a decongestant to help with her sinus drainage.  She was instructed to continue taking the Synthroid  and calcium  supplements.  I will see her in 2 weeks for follow-up.  Arrangements have been made for follow-up with Dr. [place] Antonio.  She is aware that she was diagnosed with papillary thyroid  cancer.

## 2023-09-01 ENCOUNTER — Telehealth: Payer: Self-pay | Admitting: Family Medicine

## 2023-09-01 NOTE — Telephone Encounter (Signed)
 FMLA paperwork completed and faxed to [employer] at [phone].  Out of work starting on 08/25/2023 and may return to work on 09/18/2023 without restrictions.

## 2023-09-05 ENCOUNTER — Encounter (HOSPITAL_COMMUNITY): Payer: Self-pay | Admitting: Psychiatry

## 2023-09-05 ENCOUNTER — Telehealth (INDEPENDENT_AMBULATORY_CARE_PROVIDER_SITE_OTHER): Admitting: Psychiatry

## 2023-09-05 DIAGNOSIS — F332 Major depressive disorder, recurrent severe without psychotic features: Secondary | ICD-10-CM | POA: Diagnosis not present

## 2023-09-05 DIAGNOSIS — F411 Generalized anxiety disorder: Secondary | ICD-10-CM

## 2023-09-05 DIAGNOSIS — F5104 Psychophysiologic insomnia: Secondary | ICD-10-CM

## 2023-09-05 DIAGNOSIS — E559 Vitamin D deficiency, unspecified: Secondary | ICD-10-CM

## 2023-09-05 DIAGNOSIS — F4310 Post-traumatic stress disorder, unspecified: Secondary | ICD-10-CM

## 2023-09-05 DIAGNOSIS — F4000 Agoraphobia, unspecified: Secondary | ICD-10-CM

## 2023-09-05 DIAGNOSIS — F603 Borderline personality disorder: Secondary | ICD-10-CM

## 2023-09-05 MED ORDER — SERTRALINE HCL 100 MG PO TABS: 100.0000 mg | ORAL_TABLET | Freq: Every day | ORAL | 5 refills | Status: DC

## 2023-09-05 NOTE — Patient Instructions (Addendum)
 We increased the zoloft  (sertraline ) to 100mg  once daily today.  To establish with a new psychiatrist local options would include [facility] in [place] and [facility] in [place].  There is also a [facility] in [place].  The [place] office for [facility] may also be able to do telehealth with you.
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
## 2023-09-14 ENCOUNTER — Ambulatory Visit (INDEPENDENT_AMBULATORY_CARE_PROVIDER_SITE_OTHER): Admitting: General Surgery

## 2023-09-14 ENCOUNTER — Encounter: Payer: Self-pay | Admitting: General Surgery

## 2023-09-14 VITALS — BP 146/86 | HR 71 | Temp 97.9°F | Resp 16 | Ht 63.0 in | Wt 345.0 lb

## 2023-09-14 DIAGNOSIS — Z09 Encounter for follow-up examination after completed treatment for conditions other than malignant neoplasm: Secondary | ICD-10-CM

## 2023-09-14 NOTE — Progress Notes (Signed)
 Subjective:     Norma Horton  Patient is here for postoperative visit, status post total thyroidectomy.  She has some incisional pain along the right side of her neck, but is much improved.  She has no voice changes.  She still overall has a cough with some sinus drainage.  She denies any fever or chills.  She denies any arm or periorbital twitching.  She is taking her Synthroid . Objective:    BP (!) 146/86   Pulse 71   Temp 97.9 F (36.6 C) (Oral)   Resp 16   Ht 5\' 3"  (1.6 m)   Wt (!) 345 lb (156.5 kg)   LMP 08/30/2016   SpO2 93%   BMI 61.11 kg/m   General:  alert, cooperative, and no distress  Neck incision healing well.  No hematoma present.     Assessment:    Doing well postoperatively.    Plan:   I told the patient she should follow-up with her primary care physician concerning her sinusitis and occasional cough.  She does have an appointment to follow-up with Dr. Monte Antonio on July 3.  Follow-up here as needed.  Patient may return to work tomorrow without restrictions.

## 2023-10-05 ENCOUNTER — Telehealth: Payer: Self-pay | Admitting: *Deleted

## 2023-10-05 ENCOUNTER — Ambulatory Visit (INDEPENDENT_AMBULATORY_CARE_PROVIDER_SITE_OTHER): Payer: Self-pay | Admitting: Family Medicine

## 2023-10-05 ENCOUNTER — Encounter: Payer: Self-pay | Admitting: Family Medicine

## 2023-10-05 VITALS — BP 144/76 | HR 66 | Resp 16 | Ht 63.0 in | Wt 339.4 lb

## 2023-10-05 DIAGNOSIS — F411 Generalized anxiety disorder: Secondary | ICD-10-CM | POA: Diagnosis not present

## 2023-10-05 DIAGNOSIS — T8149XA Infection following a procedure, other surgical site, initial encounter: Secondary | ICD-10-CM | POA: Diagnosis not present

## 2023-10-05 MED ORDER — CEPHALEXIN 500 MG PO CAPS: 500.0000 mg | ORAL_CAPSULE | Freq: Four times a day (QID) | ORAL | 0 refills | Status: AC

## 2023-10-05 NOTE — Assessment & Plan Note (Signed)
 The patient has an incisional infection and is advised to start taking Keflex (cephalexin) 500 mg orally four times a day for 7 days. The patient should monitor for worsening signs of infection, including increased redness, swelling, warmth, pain, drainage, or fever. If symptoms worsen or do not improve, they are advised to follow up with their surgeon as soon as possible.

## 2023-10-05 NOTE — Telephone Encounter (Signed)
 Surgical Date: 08/25/2023 Procedure: Total Thyroidectomy  Received call from patient [phone]~ telephone.   Patient states that she has infection to incision on neck. States that she was seen at PCP and advised to follow up with Dr. Larrie Po. ABTx prescribed by PCP.   Advised to take ABTx as directed. Advised if area of concern worsens to go to ER for evaluation. Advised if area does not improve with ABTx to call by the first of the week to schedule appointment with provider.   Verbalized understanding.

## 2023-10-05 NOTE — Assessment & Plan Note (Signed)
 Stable on Zoloft  100 mg daily Denies SI/HI and AVH Encouraged to continue treatment regimen as is  Nonpharmacologic intervention discussed

## 2023-10-05 NOTE — Progress Notes (Signed)
 Established Patient Office Visit  Subjective:  Patient ID: Norma Horton, female    DOB: [birth year]  Age: 53 y.o. MRN: [id number]  CC:  Chief Complaint  Patient presents with   Recurrent Skin Infections    States she has an area on her neck that she wants checked     HPI Norma Horton is a 53 y.o. female with past medical history of HTN, Anxiety, GERD presents for f/u of  chronic medical conditions. For the details of today's visit, please refer to the assessment and plan.   Past Medical History:  Diagnosis Date   Anxiety    Asthma    Depression    Pt has had suicide ideations in the past.   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    Past Surgical History:  Procedure Laterality Date   BIOPSY THYROID   03/2023   CHOLECYSTECTOMY     THYROIDECTOMY N/A 08/25/2023   Procedure: TOTAL THYROIDECTOMY;  Surgeon: Alanda Allegra, MD;  Location: [facility];  Service: General;  Laterality: N/A;  TOTAL   TUBAL LIGATION      Family History  Problem Relation Age of Onset   Breast cancer Maternal Grandmother    Heart attack Father    Hypertension Father    COPD Mother    Diabetes Mellitus II Brother    Throat cancer Brother    Cerebral aneurysm Sister    Cervical cancer Sister    Cervical cancer Sister    Heart attack Sister    Diabetes Daughter    Asthma Daughter    Cystic fibrosis Daughter    Hypertension Other     Social History   Socioeconomic History   Marital status: Married    Spouse name: Not on file   Number of children: 4   Years of education: Not on file   Highest education level: Not on file  Occupational History   Not on file  Tobacco Use   Smoking status: Never   Smokeless tobacco: Never  Vaping Use   Vaping status: Never Used  Substance and Sexual Activity   Alcohol use: Not Currently    Comment: Denied use as of 04/20/2023   Drug use: No   Sexual activity: Yes    Birth control/protection:  Surgical, Post-menopausal    Comment: tubal  Other Topics Concern   Not on file  Social History Narrative   Lives with her husband and her children    Social Drivers of Health   Financial Resource Strain: Medium Risk (02/03/2022)   Overall Financial Resource Strain (CARDIA)    Difficulty of Paying Living Expenses: Somewhat hard  Food Insecurity: No Food Insecurity (08/25/2023)   Hunger Vital Sign    Worried About Running Out of Food in the Last Year: Never true    Ran Out of Food in the Last Year: Never true  Transportation Needs: No Transportation Needs (08/25/2023)   PRAPARE - Administrator, Civil Service (Medical): No    Lack of Transportation (Non-Medical): No  Physical Activity: Inactive (02/03/2022)   Exercise Vital Sign    Days of Exercise per Week: 0 days    Minutes of Exercise per Session: 0 min  Stress: Stress Concern Present (02/03/2022)   Norma Horton of Occupational Health - Occupational Stress Questionnaire    Feeling of Stress : Very much  Social Connections: Moderately Isolated (02/03/2022)  Social Advertising account executive [NHANES]    Frequency of Communication with Friends and Family: More than three times a week    Frequency of Social Gatherings with Friends and Family: Twice a week    Attends Religious Services: Never    Database administrator or Organizations: No    Attends Banker Meetings: Never    Marital Status: Married  Catering manager Violence: Not At Risk (08/25/2023)   Humiliation, Afraid, Rape, and Kick questionnaire    Fear of Current or Ex-Partner: No    Emotionally Abused: No    Physically Abused: No    Sexually Abused: No    Outpatient Medications Prior to Visit  Medication Sig Dispense Refill   albuterol  (VENTOLIN  HFA) 108 (90 Base) MCG/ACT inhaler Inhale 2 puffs into the lungs every 6 (six) hours as needed for wheezing or shortness of breath. 6.7 each 1   budesonide -formoterol  (BREYNA ) 80-4.5 MCG/ACT  inhaler Inhale 2 puffs into the lungs 2 (two) times daily. 10.2 g 3   calcium  carbonate (OS-CAL - DOSED IN MG OF ELEMENTAL CALCIUM ) 1250 (500 Ca) MG tablet Take 1 tablet (1,250 mg total) by mouth 2 (two) times daily with a meal. 60 tablet 1   chlorthalidone  (HYGROTON ) 25 MG tablet Take 1 tablet by mouth once daily 90 tablet 0   Cyanocobalamin (B-12 PO) Take 1 tablet by mouth daily.     gabapentin  (NEURONTIN ) 100 MG capsule Take 1 capsule (100 mg total) by mouth at bedtime. 30 capsule 2   Insulin  Pen Needle (BD PEN NEEDLE NANO U/F) 32G X 4 MM MISC 1 each by Does not apply route 4 (four) times daily. 100 each 1   levothyroxine  (SYNTHROID ) 100 MCG tablet Take 1 tablet (100 mcg total) by mouth daily at 6 (six) AM. 90 tablet 1   metFORMIN  (GLUCOPHAGE ) 1000 MG tablet Take 1 tablet (1,000 mg total) by mouth 2 (two) times daily with a meal. 180 tablet 3   oxyCODONE  (ROXICODONE ) 5 MG immediate release tablet Take 1 tablet (5 mg total) by mouth every 4 (four) hours as needed. 20 tablet 0   pantoprazole  (PROTONIX ) 40 MG tablet Take 1 tablet (40 mg total) by mouth daily. 60 tablet 1   Semaglutide ,0.25 or 0.5MG /DOS, (OZEMPIC , 0.25 OR 0.5 MG/DOSE,) 2 MG/3ML SOPN Inject 0.25 mg into the skin once a week. 6 mL 1   sertraline  (ZOLOFT ) 100 MG tablet Take 1 tablet (100 mg total) by mouth daily. 30 tablet 5   valACYclovir (VALTREX) 500 MG tablet Take 500 mg by mouth daily.     Vitamin D , Ergocalciferol , (DRISDOL ) 1.25 MG (50000 UNIT) CAPS capsule Take 1 capsule (50,000 Units total) by mouth every 7 (seven) days. 20 capsule 1   No facility-administered medications prior to visit.    Allergies  Allergen Reactions   Fenoprofen Calcium      ROS Review of Systems  Constitutional:  Negative for chills and fever.  Eyes:  Negative for visual disturbance.  Respiratory:  Negative for chest tightness and shortness of breath.   Skin:  Positive for wound (incision (neck)).  Neurological:  Negative for dizziness and  headaches.      Objective:     Physical Exam HENT:     Head: Normocephalic.     Mouth/Throat:     Mouth: Mucous membranes are moist.  Cardiovascular:     Rate and Rhythm: Normal rate.     Heart sounds: Normal heart sounds.  Pulmonary:     Effort:  Pulmonary effort is normal.     Breath sounds: Normal breath sounds.  Skin:    Findings: Erythema (neck incision/ tender to palpation) present.  Neurological:     Mental Status: She is alert.     BP (!) 144/76   Pulse 66   Resp 16   Ht 5\' 3"  (1.6 m)   Wt (!) 339 lb 6.4 oz (154 kg)   LMP 08/30/2016   SpO2 92%   BMI 60.12 kg/m  Wt Readings from Last 3 Encounters:  10/05/23 (!) 339 lb 6.4 oz (154 kg)  09/14/23 (!) 345 lb (156.5 kg)  08/31/23 (!) 336 lb (152.4 kg)    Lab Results  Component Value Date   TSH 2.494 08/23/2023   Lab Results  Component Value Date   WBC 10.4 08/27/2023   HGB 12.6 08/27/2023   HCT 40.7 08/27/2023   MCV 98.1 08/27/2023   PLT 254 08/27/2023   Lab Results  Component Value Date   NA 136 08/27/2023   K 3.3 (L) 08/27/2023   CO2 30 08/27/2023   GLUCOSE 145 (H) 08/27/2023   BUN 14 08/27/2023   CREATININE 0.58 08/27/2023   BILITOT 0.8 08/27/2023   ALKPHOS 69 08/27/2023   AST 53 (H) 08/27/2023   ALT 53 (H) 08/27/2023   PROT 7.0 08/27/2023   ALBUMIN 3.2 (L) 08/27/2023   CALCIUM  8.6 (L) 08/27/2023   ANIONGAP 11 08/27/2023   EGFR 104 03/03/2023   Lab Results  Component Value Date   CHOL 162 03/03/2023   Lab Results  Component Value Date   HDL 57 03/03/2023   Lab Results  Component Value Date   LDLCALC 84 03/03/2023   Lab Results  Component Value Date   TRIG 119 03/03/2023   Lab Results  Component Value Date   CHOLHDL 2.8 03/03/2023   Lab Results  Component Value Date   HGBA1C 7.0 (H) 08/23/2023      Assessment & Plan:  Infected incision Assessment & Plan: The patient has an incisional infection and is advised to start taking Keflex (cephalexin) 500 mg orally four  times a day for 7 days. The patient should monitor for worsening signs of infection, including increased redness, swelling, warmth, pain, drainage, or fever. If symptoms worsen or do not improve, they are advised to follow up with their surgeon as soon as possible.   Orders: -     Cephalexin; Take 1 capsule (500 mg total) by mouth 4 (four) times daily for 7 days.  Dispense: 28 capsule; Refill: 0  Generalized anxiety disorder Assessment & Plan: Stable on Zoloft  100 mg daily Denies SI/HI and AVH Encouraged to continue treatment regimen as is  Nonpharmacologic intervention discussed    Note: This chart has been completed using Engineer, civil (consulting) software, and while attempts have been made to ensure accuracy, certain words and phrases may not be transcribed as intended.    Follow-up: Return in about 4 months (around 02/04/2024).   Giavanna Kang, FNP

## 2023-10-05 NOTE — Patient Instructions (Addendum)
 I appreciate the opportunity to provide care to you today!    Follow up:  4 months  Please start taking Keflex (cephalexin) 500 mg orally four times a day for 7 days.  Please monitor for worsening signs of infection, including increased redness, swelling, warmth, pain, drainage, or fever.  If symptoms worsen or do not improve, Please follow up with your surgeon as soon as possible.   Please follow up if your symptoms worsen or fail to improve.    Please continue to a heart-healthy diet and increase your physical activities. Try to exercise for at least five days a week.    It was a pleasure to see you and I look forward to continuing to work together on your health and well-being. Please do not hesitate to call the office if you need care or have questions about your care.  In case of emergency, please visit the Emergency Department for urgent care, or contact our clinic at [phone] to schedule an appointment. We're here to help you!   Have a wonderful day and week. With Gratitude, Faaris Arizpe MSN, FNP-BC

## 2023-10-09 ENCOUNTER — Other Ambulatory Visit: Payer: Self-pay | Admitting: Family Medicine

## 2023-10-09 DIAGNOSIS — R600 Localized edema: Secondary | ICD-10-CM

## 2023-10-11 ENCOUNTER — Encounter: Payer: Self-pay | Admitting: Family Medicine

## 2023-10-11 ENCOUNTER — Ambulatory Visit: Admitting: Family Medicine

## 2023-10-11 ENCOUNTER — Ambulatory Visit: Payer: Self-pay

## 2023-10-11 ENCOUNTER — Ambulatory Visit (HOSPITAL_COMMUNITY)
Admission: RE | Admit: 2023-10-11 | Discharge: 2023-10-11 | Disposition: A | Discharge status: Home / Self Care | Source: Ambulatory Visit | Attending: Family Medicine | Admitting: Family Medicine

## 2023-10-11 VITALS — BP 161/84 | HR 67 | Resp 16 | Ht 63.0 in | Wt 342.0 lb

## 2023-10-11 DIAGNOSIS — H1010 Acute atopic conjunctivitis, unspecified eye: Secondary | ICD-10-CM

## 2023-10-11 DIAGNOSIS — J309 Allergic rhinitis, unspecified: Secondary | ICD-10-CM

## 2023-10-11 DIAGNOSIS — W19XXXA Unspecified fall, initial encounter: Secondary | ICD-10-CM

## 2023-10-11 DIAGNOSIS — I10 Essential (primary) hypertension: Secondary | ICD-10-CM

## 2023-10-11 DIAGNOSIS — M25562 Pain in left knee: Secondary | ICD-10-CM | POA: Insufficient documentation

## 2023-10-11 MED ORDER — MONTELUKAST SODIUM 10 MG PO TABS: 10.0000 mg | ORAL_TABLET | Freq: Every day | ORAL | 0 refills | Status: AC

## 2023-10-11 MED ORDER — TRAMADOL HCL 50 MG PO TABS: 50.0000 mg | ORAL_TABLET | Freq: Four times a day (QID) | ORAL | 0 refills | Status: AC | PRN

## 2023-10-11 NOTE — Patient Instructions (Signed)
 F/u with PCP re uncontrolled blood pressure and acute knee injury in 3 to 5 weweeks  Montelukast is prescribed for allergies  Pls get X ray of your knee today  Tramadol is prescribed for pain, try to limit to no more than 3 per day  Thanks for choosing [facility], we consider it a privelige to serve you.

## 2023-10-11 NOTE — Assessment & Plan Note (Addendum)
 Acute injury reports pain rated at an 8, X ray and limited tramadol , doubt fracture

## 2023-10-11 NOTE — Telephone Encounter (Signed)
 Patient scheduled.

## 2023-10-11 NOTE — Telephone Encounter (Signed)
 FYI Only or Action Required?: FYI only for provider  Patient was last seen in primary care on 10/05/2023 by Zarwolo, Gloria, FNP. Called Nurse Triage reporting Fall and Knee Pain. Symptoms began several days ago. Interventions attempted: Prescription medications: Oxycodone . Symptoms are: unchanged.  Triage Disposition: See PCP When Office is Open (Within 3 Days)  Patient/caregiver understands and will follow disposition?: Yes                 Copied from CRM [id number]. Topic: Clinical - Red Word Triage >> Oct 11, 2023 10:23 AM Baldemar Lev wrote: Red Word that prompted transfer to Nurse Triage: Pt fell Sunday and injured her knee. Can hardly walk Severe pain Reason for Disposition  [1] MODERATE pain (e.g., interferes with normal activities, limping) AND [2] present > 3 days  Answer Assessment - Initial Assessment Questions 1. LOCATION and RADIATION: Where is the pain located?      L knee, back  2. QUALITY: What does the pain feel like?  (e.g., sharp, dull, aching, burning)     Sharp  3. SEVERITY: How bad is the pain? What does it keep you from doing?   (Scale 1-10; or mild, moderate, severe)   -  MILD (1-3): doesn't interfere with normal activities    -  MODERATE (4-7): interferes with normal activities (e.g., work or school) or awakens from sleep, limping    -  SEVERE (8-10): excruciating pain, unable to do any normal activities, unable to walk     8/10  4. ONSET: When did the pain start? Does it come and go, or is it there all the time?     Sunday -- pt states she went down because the ground was slick while weed-eating and landed in the mud; denies head strike 5. RECURRENT: Have you had this pain before? If Yes, ask: When, and what happened then?     No  6. SETTING: Has there been any recent work, exercise or other activity that involved that part of the body?      Fall  7. AGGRAVATING FACTORS: What makes the knee pain worse? (e.g., walking, climbing  stairs, running)     Walking  8. ASSOCIATED SYMPTOMS: Is there any swelling or redness of the knee?     It looks like it's swollen, a little red but not bad 9. OTHER SYMPTOMS: Do you have any other symptoms? (e.g., chest pain, difficulty breathing, fever, calf pain)      Can barely walk, states she is hobbling  Pt states she took a pain pill last night (oxycodone ), helped   Denies numbness/weakness/tingling  Protocols used: Knee Pain-A-AH

## 2023-10-17 ENCOUNTER — Ambulatory Visit: Payer: Self-pay | Admitting: Family Medicine

## 2023-10-17 ENCOUNTER — Encounter: Payer: Self-pay | Admitting: Family Medicine

## 2023-10-17 DIAGNOSIS — W19XXXA Unspecified fall, initial encounter: Secondary | ICD-10-CM | POA: Insufficient documentation

## 2023-10-17 DIAGNOSIS — H1010 Acute atopic conjunctivitis, unspecified eye: Secondary | ICD-10-CM | POA: Insufficient documentation

## 2023-10-17 NOTE — Assessment & Plan Note (Signed)
 X ray of left knee due to uncontrolled pain s/p direct trauma with fall

## 2023-10-17 NOTE — Progress Notes (Signed)
   Norma Horton     MRN: [id number]      DOB: 1971/04/07  Chief Complaint  Patient presents with   Norma Horton on Sunday while weed eating and landed on her left knee. Has been hurting since.     HPI Norma Horton is here with above concern. Though she is able to weight brear on the knee , the pain is bothersome C/o uncontrolled allergy symptoms and requests medication for this also  ROS Denies recent fever or chills. C/o  sinus pressure, nasal congestion,  denies ear pain or sore throat. Denies chest congestion, productive cough or wheezing. Denies chest pains, palpitations and leg swelling Denies headaches, seizures, numbness, or tingling. Denies uncontrolled  depression, anxiety or insomnia. Denies skin break down or rash.   PE  BP (!) 161/84   Pulse 67   Resp 16   Ht 5' 3 (1.6 m)   Wt (!) 342 lb (155.1 kg)   LMP 08/30/2016   SpO2 95%   BMI 60.58 kg/m   Patient alert and oriented and in no cardiopulmonary distress.  HEENT: No facial asymmetry, EOMI,     Neck supple .  Chest: Clear to auscultation bilaterally.  CVS: S1, S2 no murmurs, no S3.Regular rate.  ABD: Soft non tender.   Ext: No edema  MS: decreased ROM spine, , hips and knees.Tender, swelling and fcrepitus noted on left knee  Skin: Intact, no ulcerations or rash noted.  Psych: Good eye contact, normal affect. Memory intact not anxious or depressed appearing.  CNS: CN 2-12 intact, power,  normal throughout.no focal deficits noted.   Assessment & Plan  Knee pain, left Acute injury reports pain rated at an 8, X ray and limited tramadol , doubt fracture  Essential hypertension, benign Uncontrolled at consuiistently elevated PCP top re evaluate and adjust medication, in pain at visit , no changes made DASH diet and commitment to daily physical activity for a minimum of 30 minutes discussed and encouraged, as a part of hypertension management. The importance of attaining a healthy weight is also  discussed.     10/11/2023    4:00 PM 10/05/2023    8:53 AM 09/14/2023    9:22 AM 08/31/2023   10:03 AM 08/27/2023    3:42 AM 08/26/2023    8:13 PM 08/26/2023    2:33 PM  BP/Weight  Systolic BP 161 144 146 155 151 104 128  Diastolic BP 84 76 86 96 81 70 78  Wt. (Lbs) 342 339.4 345 336     BMI 60.58 kg/m2 60.12 kg/m2 61.11 kg/m2 59.52 kg/m2          Fall X ray of left knee due to uncontrolled pain s/p direct trauma with fall  Allergic rhinoconjunctivitis Uncontrolled symptoms, start montelukast  daily

## 2023-10-17 NOTE — Assessment & Plan Note (Signed)
 Uncontrolled symptoms, start montelukast  daily

## 2023-10-17 NOTE — Assessment & Plan Note (Addendum)
 Uncontrolled at consuiistently elevated PCP top re evaluate and adjust medication, in pain at visit , no changes made DASH diet and commitment to daily physical activity for a minimum of 30 minutes discussed and encouraged, as a part of hypertension management. The importance of attaining a healthy weight is also discussed.     10/11/2023    4:00 PM 10/05/2023    8:53 AM 09/14/2023    9:22 AM 08/31/2023   10:03 AM 08/27/2023    3:42 AM 08/26/2023    8:13 PM 08/26/2023    2:33 PM  BP/Weight  Systolic BP 161 144 146 155 151 104 128  Diastolic BP 84 76 86 96 81 70 78  Wt. (Lbs) 342 339.4 345 336     BMI 60.58 kg/m2 60.12 kg/m2 61.11 kg/m2 59.52 kg/m2

## 2023-11-02 ENCOUNTER — Ambulatory Visit: Admitting: "Endocrinology

## 2023-11-03 LAB — COMPREHENSIVE METABOLIC PANEL WITH GFR
ALT: 41 IU/L — ABNORMAL HIGH (ref 0–32)
AST: 28 IU/L (ref 0–40)
Albumin: 4.2 g/dL (ref 3.8–4.9)
Alkaline Phosphatase: 120 IU/L (ref 44–121)
BUN/Creatinine Ratio: 17 (ref 9–23)
BUN: 14 mg/dL (ref 6–24)
Bilirubin Total: 0.5 mg/dL (ref 0.0–1.2)
CO2: 25 mmol/L (ref 20–29)
Calcium: 9.9 mg/dL (ref 8.7–10.2)
Chloride: 95 mmol/L — ABNORMAL LOW (ref 96–106)
Creatinine, Ser: 0.82 mg/dL (ref 0.57–1.00)
Globulin, Total: 3.3 g/dL (ref 1.5–4.5)
Glucose: 139 mg/dL — ABNORMAL HIGH (ref 70–99)
Potassium: 3.9 mmol/L (ref 3.5–5.2)
Sodium: 137 mmol/L (ref 134–144)
Total Protein: 7.5 g/dL (ref 6.0–8.5)
eGFR: 84 mL/min/1.73 (ref >59)

## 2023-11-03 LAB — T4, FREE: Free T4: 1.33 ng/dL (ref 0.82–1.77)

## 2023-11-03 LAB — TSH: TSH: 10 u[IU]/mL — ABNORMAL HIGH (ref 0.450–4.500)

## 2023-11-13 ENCOUNTER — Encounter: Payer: Self-pay | Admitting: "Endocrinology

## 2023-11-13 ENCOUNTER — Ambulatory Visit (INDEPENDENT_AMBULATORY_CARE_PROVIDER_SITE_OTHER): Admitting: "Endocrinology

## 2023-11-13 VITALS — BP 126/78 | HR 64 | Ht 63.0 in | Wt 337.0 lb

## 2023-11-13 DIAGNOSIS — E119 Type 2 diabetes mellitus without complications: Secondary | ICD-10-CM

## 2023-11-13 DIAGNOSIS — I10 Essential (primary) hypertension: Secondary | ICD-10-CM | POA: Diagnosis not present

## 2023-11-13 DIAGNOSIS — C73 Malignant neoplasm of thyroid gland: Secondary | ICD-10-CM | POA: Diagnosis not present

## 2023-11-13 DIAGNOSIS — E890 Postprocedural hypothyroidism: Secondary | ICD-10-CM | POA: Diagnosis not present

## 2023-11-13 DIAGNOSIS — Z6841 Body Mass Index (BMI) 40.0 and over, adult: Secondary | ICD-10-CM

## 2023-11-13 DIAGNOSIS — Z7984 Long term (current) use of oral hypoglycemic drugs: Secondary | ICD-10-CM

## 2023-11-13 LAB — POCT GLYCOSYLATED HEMOGLOBIN (HGB A1C): HbA1c, POC (controlled diabetic range): 7.2 % — AB (ref 0.0–7.0)

## 2023-11-13 MED ORDER — LEVOTHYROXINE SODIUM 150 MCG PO TABS: 150.0000 ug | ORAL_TABLET | Freq: Every day | ORAL | 1 refills | Status: DC

## 2023-11-13 NOTE — Patient Instructions (Signed)

## 2023-11-14 NOTE — Progress Notes (Signed)
 11/13/2023   Endocrinology follow-up note  Subjective:    Patient ID: Norma Horton, female    DOB: 1970-09-29, PCP Zarwolo, Gloria, FNP   Past Medical History:  Diagnosis Date   Anxiety    Asthma    Depression    Pt has had suicide ideations in the past.   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   Past Surgical History:  Procedure Laterality Date   BIOPSY THYROID   03/2023   CHOLECYSTECTOMY     THYROIDECTOMY N/A 08/25/2023   Procedure: TOTAL THYROIDECTOMY;  Surgeon: Mavis Anes, MD;  Location: [facility];  Service: General;  Laterality: N/A;  TOTAL   TUBAL LIGATION     Social History   Socioeconomic History   Marital status: Married    Spouse name: Not on file   Number of children: 4   Years of education: Not on file   Highest education level: Not on file  Occupational History   Not on file  Tobacco Use   Smoking status: Never   Smokeless tobacco: Never  Vaping Use   Vaping status: Never Used  Substance and Sexual Activity   Alcohol use: Not Currently    Comment: Denied use as of 04/20/2023   Drug use: No   Sexual activity: Yes    Birth control/protection: Surgical, Post-menopausal    Comment: tubal  Other Topics Concern   Not on file  Social History Narrative   Lives with her husband and her children    Social Drivers of Health   Financial Resource Strain: Medium Risk (02/03/2022)   Overall Financial Resource Strain (CARDIA)    Difficulty of Paying Living Expenses: Somewhat hard  Food Insecurity: No Food Insecurity (08/25/2023)   Hunger Vital Sign    Worried About Running Out of Food in the Last Year: Never true    Ran Out of Food in the Last Year: Never true  Transportation Needs: No Transportation Needs (08/25/2023)   PRAPARE - Administrator, Civil Service (Medical): No    Lack of Transportation (Non-Medical): No  Physical Activity: Inactive  (02/03/2022)   Exercise Vital Sign    Days of Exercise per Week: 0 days    Minutes of Exercise per Session: 0 min  Stress: Stress Concern Present (02/03/2022)   [name]    Feeling of Stress : Very much  Social Connections: Moderately Isolated (02/03/2022)   Social Connection and Isolation Panel    Frequency of Communication with Friends and Family: More than three times a week    Frequency of Social Gatherings with Friends and Family: Twice a week    Attends Religious Services: Never    Database administrator or Organizations: No    Attends Engineer, structural: Never    Marital Status: Married   Family History  Problem Relation Age of Onset   Breast cancer Maternal Grandmother    Heart attack Father    Hypertension Father    COPD Mother    Diabetes Mellitus II Brother    Throat cancer Brother    Cerebral aneurysm Sister    Cervical cancer Sister  Cervical cancer Sister    Heart attack Sister    Diabetes Daughter    Asthma Daughter    Cystic fibrosis Daughter    Hypertension Other    Outpatient Encounter Medications as of 11/13/2023  Medication Sig   albuterol  (VENTOLIN  HFA) 108 (90 Base) MCG/ACT inhaler Inhale 2 puffs into the lungs every 6 (six) hours as needed for wheezing or shortness of breath.   budesonide -formoterol  (BREYNA ) 80-4.5 MCG/ACT inhaler Inhale 2 puffs into the lungs 2 (two) times daily.   calcium  carbonate (OS-CAL - DOSED IN MG OF ELEMENTAL CALCIUM ) 1250 (500 Ca) MG tablet Take 1 tablet (1,250 mg total) by mouth 2 (two) times daily with a meal.   chlorthalidone  (HYGROTON ) 25 MG tablet Take 1 tablet by mouth once daily   Cyanocobalamin (B-12 PO) Take 1 tablet by mouth daily.   gabapentin  (NEURONTIN ) 100 MG capsule Take 1 capsule (100 mg total) by mouth at bedtime.   Insulin  Pen Needle (BD PEN NEEDLE NANO U/F) 32G X 4 MM MISC 1 each by Does not apply route 4 (four) times daily.    levothyroxine  (SYNTHROID ) 150 MCG tablet Take 1 tablet (150 mcg total) by mouth daily before breakfast.   metFORMIN  (GLUCOPHAGE ) 1000 MG tablet Take 1 tablet (1,000 mg total) by mouth 2 (two) times daily with a meal.   montelukast  (SINGULAIR ) 10 MG tablet Take 1 tablet (10 mg total) by mouth at bedtime.   oxyCODONE  (ROXICODONE ) 5 MG immediate release tablet Take 1 tablet (5 mg total) by mouth every 4 (four) hours as needed.   pantoprazole  (PROTONIX ) 40 MG tablet Take 1 tablet (40 mg total) by mouth daily.   Semaglutide ,0.25 or 0.5MG /DOS, (OZEMPIC , 0.25 OR 0.5 MG/DOSE,) 2 MG/3ML SOPN Inject 0.25 mg into the skin once a week. (Patient not taking: Reported on 10/11/2023)   sertraline  (ZOLOFT ) 100 MG tablet Take 1 tablet (100 mg total) by mouth daily.   valACYclovir (VALTREX) 500 MG tablet Take 500 mg by mouth daily.   Vitamin D , Ergocalciferol , (DRISDOL ) 1.25 MG (50000 UNIT) CAPS capsule Take 1 capsule (50,000 Units total) by mouth every 7 (seven) days.   [DISCONTINUED] levothyroxine  (SYNTHROID ) 100 MCG tablet Take 1 tablet (100 mcg total) by mouth daily at 6 (six) AM.   No facility-administered encounter medications on file as of 11/13/2023.   ALLERGIES: Allergies  Allergen Reactions   Fenoprofen Calcium      VACCINATION STATUS: Immunization History  Administered Date(s) Administered   Tdap 05/23/1998, 11/16/2021   Zoster Recombinant(Shingrix ) 01/21/2022, 04/22/2022    HPI Norma Horton is 53 y.o. female who presents today with a medical history as above. she is being seen in follow-up after she was seen in consultation for multinodular goiter requested by Zarwolo, Gloria, FNP.  She is status post total thyroidectomy for thyroid  malignancy on August 25, 2023. See notes from previous visit.  She has history of multinodular goiter dating back to 2017.   She did have biopsy of a  right lobe nodule where results were reported to be atypia of undetermined significance.  Her recent repeat  thyroid  ultrasound in July 2024  did not show significant change in the size of the nodule-6.4 x 4.7 x 3cm. She was sent for repeat FNA after her last visit. Her FNA was sent for Afirma, which resulted in ~50% risk of malignancy.  She was approached for surgical treatment and her significant Histuss, she agreed for a referral to a Careers adviser.   She underwent colon polyp 20 on 07/25/2023  which revealed 0.9 cm classic carcinoma lymphovascular invasion.  However, tumor was present at inked H.    She presents with improvement in her previously noted dysphagia, occasional shortness of breath. She is currently on levothyroxine  100 mcg p.o. daily before breakfast.  Her previsit thyroid  function tests are consistent with under replacement.  Patient denies  voice change.   She has no family history of thyroid  dysfunction or thyroid  malignancy.  Her other medical problems include type 2 diabetes on metformin  1000 mg p.o. twice daily.  Her point-of-care A1c today is 7.2%, stable compared to her previous presentations.  She has hypertension on treatment with hydrochlorothiazide .   Review of Systems  Constitutional:  No recent weight change, no fatigue, no subjective hyperthermia, no subjective hypothermia Eyes: no blurry vision, no xerophthalmia    Objective:       11/13/2023    9:10 AM 10/11/2023    4:00 PM 10/05/2023    8:53 AM  Vitals with BMI  Height 5' 3 5' 3 5' 3  Weight 337 lbs 342 lbs 339 lbs 6 oz  BMI 59.71 60.6 60.14  Systolic 126 161 855  Diastolic 78 84 76  Pulse 64 67 66    BP 126/78   Pulse 64   Ht 5' 3 (1.6 m)   Wt (!) 337 lb (152.9 kg)   LMP 08/30/2016   BMI 59.70 kg/m   Wt Readings from Last 3 Encounters:  11/13/23 (!) 337 lb (152.9 kg)  10/11/23 (!) 342 lb (155.1 kg)  10/05/23 (!) 339 lb 6.4 oz (154 kg)    Physical Exam  Constitutional:  Body mass index is 59.7 kg/m.,  not in acute distress, normal state of mind Eyes: PERRLA, EOMI, no exophthalmos ENT: moist  mucous membranes, + gross thyromegaly, no gross cervical lymphadenopathy   CMP ( most recent) CMP     Component Value Date/Time   NA 137 11/02/2023 0900   K 3.9 11/02/2023 0900   CL 95 (L) 11/02/2023 0900   CO2 25 11/02/2023 0900   GLUCOSE 139 (H) 11/02/2023 0900   GLUCOSE 145 (H) 08/27/2023 0441   BUN 14 11/02/2023 0900   CREATININE 0.82 11/02/2023 0900   CALCIUM  9.9 11/02/2023 0900   PROT 7.5 11/02/2023 0900   ALBUMIN 4.2 11/02/2023 0900   AST 28 11/02/2023 0900   ALT 41 (H) 11/02/2023 0900   ALKPHOS 120 11/02/2023 0900   BILITOT 0.5 11/02/2023 0900   GFRNONAA >60 08/27/2023 0441   GFRAA >60 11/06/2010 2348    Thyroid  ultrasound on November 19, 2015 FINDINGS: Right thyroid  lobe   Measurements: 6.8 x 3.8 x 4.5 cm. Right upper pole predominately solid nodule measures 4.9 x 3.7 x 4.5 cm   Left thyroid  lobe : Measurements: 4.9 x 1.5 x 1.4 cm. Heterogeneous tissue without focal  nodule.   Isthmus : Thickness: 0.3 cm.  No nodules visualized.   Lymphadenopathy: None visualized.   IMPRESSION: Solitary large right lobe nodule measures 4.9 cm. Findings meet consensus criteria for biopsy. Ultrasound-guided fine needle aspiration should be considered, as per the consensus statement: Management of Thyroid  Nodules Detected at US : Society of Radiologists in Ultrasound Consensus Conference Statement. Radiology 2005; [id number].  Final needle aspiration biopsy of thyroid  nodules in August 2017 COMMENT: THE SPECIMEN IS HYPERCELLULAR AND CONSISTS OF SMALL AND MEDIUM SIZED GROUPS OF FOLLICULAR  EPITHELIAL CELLS WITH CYTOLOGIC ATYPIA, INCLUDING NUCLEAR ENLARGEMENT AND OVERLAP.    Patient did not want surgery.  Repeat thyroid  ultrasound was performed on January 21, 2022 showing   Isthmus: 0.3 cm ,previously 0.3 cm   Right lobe: 7.7 x 3.9 x 4.7 cm ,previously 6.8 x 3.8 x 4.5 cm,    Left lobe: 5.2 x 1.1 x 1.5 cm ,previously 4.9 x 1.5 x 1.4 cm  Nodule in the right lobe of her  thyroid  size: 6.4 cm; Other 2 dimensions: 4.8 x 3.8 cm, previously,  4.9 x 3.7 x 4.5 cm  FNA 03/20/23 Clinical History: 6.4 cm RML; TI-RADS risk category: TR4 (4-6 pts.)  Specimen Submitted:  A. THYROID , RML, FINE NEEDLE ASPIRATION:    FINAL MICROSCOPIC DIAGNOSIS:  - Atypia of undetermined significance (Bethesda category III)   Afirma: GSC Classifier Candia Classifier - Suspicious ( ~ 50% risk of malignancy).   Thyroidectomy on August 25, 2023 FINAL MICROSCOPIC DIAGNOSIS:   A. THYROID , LEFT, LOBECTOMY:  - Thyroid  follicular hyperplasia/adenomatous nodule  - No malignancy identified   B. THYROID , RIGHT, LOBECTOMY:  - Papillary thyroid  carcinoma, classic type, 0.9 cm  - Tumor is present at inked edge  - No lymphovascular invasion identified  - Thyroid  follicular nodular disease/adenomatous nodules  - See oncology table  Diabetic Labs (most recent): Lab Results  Component Value Date   HGBA1C 7.2 (A) 11/13/2023   HGBA1C 7.0 (H) 08/23/2023   HGBA1C 7.1 (H) 03/03/2023     Lab Results  Component Value Date   TSH 10.000 (H) 11/02/2023   TSH 2.494 08/23/2023   TSH 3.280 03/03/2023   TSH 3.390 03/03/2023   TSH 2.920 10/31/2022   TSH 3.640 08/10/2022   TSH 2.300 07/25/2022   TSH 2.750 04/22/2022   TSH 3.900 01/21/2022   TSH 2.660 11/16/2021   FREET4 1.33 11/02/2023   FREET4 1.17 03/03/2023   FREET4 1.16 03/03/2023   FREET4 1.15 10/31/2022   FREET4 1.22 08/10/2022   FREET4 1.28 07/25/2022   FREET4 1.21 04/22/2022   FREET4 1.15 01/21/2022   FREET4 1.14 11/16/2021      Assessment & Plan:   1.  Thyroid  malignancy   2.  Left Hemi hypothyroidism  3.  Morbid obesity   4  Hypertension   5.  Type 2 diabetes  6. Vitamin D  deficiency   - Based on these reviews, she has history of nodular goiter, growing overall, and growing nodule on the right lobe.  This right lobe nodule was previously biopsied with bethesda 3 findings in 2017.  After she was approached for a repeat  biopsy of a 6.4 cm nodule in July 2024, she opted to avoid it.  She was re-approached due to the fact that she reported occasional compressive symptoms. Hide biopsy results on March 20, 2023 was consistent with atypia of undetermined significance.  A sample was sent for molecular testing with Afirma gene sequencing classifier which showed approximately 50% risk of malignancy. 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  I had a long discussion with her about treatment course and expectations for differentiated thyroid  cancer. She is low to intermediate risk for tumor recurrence. Considering this patient's follow-up behavior, was offered adjuvant treatment with radioactive iodine thyroid  remnant ablation stimulated by Thyrogen. This treatment will be scheduled at [facility] nuclear medicine in the next several days.  She will have thyroglobulin and thyroglobulin body measurements before her next visit.  - For her postsurgical hypothyroidism she will need a higher dose of levothyroxine .  I discussed and increased her levothyroxine  to 150 mcg p.o. daily before breakfast.   -  We discussed about the correct intake of her thyroid  hormone, on empty stomach at fasting, with water, separated by at least 30 minutes from breakfast and other medications,  and separated by more than 4 hours from calcium , iron, multivitamins, acid reflux medications (PPIs). -Patient is made aware of the fact that thyroid  hormone replacement is needed for life, dose to be adjusted by periodic monitoring of thyroid  function tests.   -For her diabetes type 2 complicated by morbid obesity, she will benefit from GLP-1 receptor agonists which she will be considered for after completion of her thyroid  cancer treatment.  In the meantime, she is advised to continue metformin  1000 mg p.o. twice daily.  Her point-of-care A1c is 7.2% today.  - Her  blood pressure was controlled to target.  She is advised to continue chlorthalidone  25 mg p.o. daily at breakfast.  She is also encouraged to continue on her vitamin D2 50,000 units weekly.   - she is advised to maintain close follow up with Zarwolo, Gloria, FNP for primary care needs.    I spent  41  minutes in the care of the patient today including review of labs from Thyroid  Function, CMP, and other relevant labs ; imaging/biopsy records (current and previous including abstractions from other facilities); face-to-face time discussing  her lab results and symptoms, medications doses, her options of short and long term treatment based on the latest standards of care / guidelines;   and documenting the encounter.  Norma Horton  participated in the discussions, expressed understanding, and voiced agreement with the above plans.  All questions were answered to her satisfaction. she is encouraged to contact clinic should she have any questions or concerns prior to her return visit.    Follow up plan: Return in about 5 weeks (around 12/18/2023) for F/U with Whole Body Scan w/Thyrogen, F/U with Pre-visit Labs.   Ranny Earl, MD [facility] [address] [place], [place] [address] Phone: [phone]  Fax: [phone]     11/14/2023, 8:14 AM  This note was partially dictated with voice recognition software. Similar sounding words can be transcribed inadequately or may not  be corrected upon review.

## 2023-11-30 NOTE — Written Directive (Addendum)
 MOLECULAR IMAGING AND THERAPEUTICS WRITTEN DIRECTIVE   PATIENT NAME: Norma Horton  PT DOB:   April 15, 1971                                              MRN: [id number]  ---------------------------------------------------------------------------------------------------------------------   I-131 THYROID  CANCER THERAPY   RADIOPHARMACEUTICAL:  Iodine-131 Capsule    PRESCRIBED DOSE FOR ADMINISTRATION: 70 mCi   ROUTE OFADMINISTRATION:  PO   DIAGNOSIS: Papillary thyroid  carcinoma    REFERRING PHYSICIAN: Dr. Lenis HORNING STIMULATION OR HORMONE WITHDRAW: Thyrogen Stimulation   REMNANT ABLATION OR ADJUVANT THERAPY: Adjuvant Therapy   DATE OF THYROIDECTOMY: 08/25/2023   SURGEON: Dr. Mavis   TSH:   Lab Results  Component Value Date   TSH 10.000 (H) 11/02/2023   TSH 2.494 08/23/2023   TSH 3.280 03/03/2023   TSH 3.390 03/03/2023     PRIOR I-131 THERAPY (Date and Dose):   Pathology:  Cell type: [x]   Papillary  []   Follicular  []   [place]   Largest tumor focus:    0.9  cm  Extrathyroidal Extension?     Yes []   No [x]     Lymphovascular Invasion?  Yes  []   No  [x]     Margins positive ? Yes [x]   No []     Lymph nodes positive? Yes []   No  []       # positive nodes:  0 # negative nodes: 0    TNM staging: pT: 1a        PN:  x       Mx:    ADDITIONAL PHYSICIAN COMMENTS/NOTES  Low to intermediate risk PTC.  Inked marging positive. AUTHORIZED USER SIGNATURE & TIME STAMP: Norleen GORMAN Boxer, MD   11/30/23    12:02 PM

## 2023-12-06 ENCOUNTER — Encounter (HOSPITAL_COMMUNITY)
Admission: RE | Admit: 2023-12-06 | Discharge: 2023-12-06 | Disposition: A | Discharge status: Home / Self Care | Source: Ambulatory Visit | Attending: "Endocrinology | Admitting: "Endocrinology

## 2023-12-06 ENCOUNTER — Encounter (HOSPITAL_COMMUNITY): Payer: Self-pay

## 2023-12-06 DIAGNOSIS — C73 Malignant neoplasm of thyroid gland: Secondary | ICD-10-CM | POA: Diagnosis present

## 2023-12-06 MED ORDER — STERILE WATER FOR INJECTION IJ SOLN
INTRAMUSCULAR | Status: AC
Start: 2023-12-06 — End: 2023-12-06
  Filled 2023-12-06: qty 10

## 2023-12-06 MED ORDER — THYROTROPIN ALFA 0.9 MG IM SOLR
0.9000 mg | INTRAMUSCULAR | Status: AC
  Administered 2023-12-06: 0.9 mg via INTRAMUSCULAR

## 2023-12-06 MED ORDER — THYROTROPIN ALFA 0.9 MG IM SOLR
INTRAMUSCULAR | Status: AC
  Filled 2023-12-06: qty 0.9

## 2023-12-06 MED ORDER — STERILE WATER FOR INJECTION IJ SOLN
1.2000 mL | Freq: Once | INTRAMUSCULAR | Status: AC
  Administered 2023-12-06: 1.2 mL via INTRAMUSCULAR

## 2023-12-07 ENCOUNTER — Encounter (HOSPITAL_COMMUNITY)
Admission: RE | Admit: 2023-12-07 | Discharge: 2023-12-07 | Disposition: A | Discharge status: Home / Self Care | Source: Ambulatory Visit | Attending: "Endocrinology | Admitting: "Endocrinology

## 2023-12-07 DIAGNOSIS — C73 Malignant neoplasm of thyroid gland: Secondary | ICD-10-CM | POA: Diagnosis present

## 2023-12-07 MED ORDER — THYROTROPIN ALFA 0.9 MG IM SOLR
INTRAMUSCULAR | Status: AC
  Filled 2023-12-07: qty 0.9

## 2023-12-07 MED ORDER — STERILE WATER FOR INJECTION IJ SOLN
1.2000 mL | Freq: Once | INTRAMUSCULAR | Status: AC
  Administered 2023-12-07: 1.2 mL via INTRAMUSCULAR

## 2023-12-07 MED ORDER — STERILE WATER FOR INJECTION IJ SOLN
INTRAMUSCULAR | Status: AC
Start: 2023-12-07 — End: 2023-12-07
  Filled 2023-12-07: qty 10

## 2023-12-07 MED ORDER — THYROTROPIN ALFA 0.9 MG IM SOLR
0.9000 mg | INTRAMUSCULAR | Status: AC
  Administered 2023-12-07: 0.9 mg via INTRAMUSCULAR

## 2023-12-08 ENCOUNTER — Encounter (HOSPITAL_COMMUNITY)
Admission: RE | Admit: 2023-12-08 | Discharge: 2023-12-08 | Disposition: A | Discharge status: Home / Self Care | Source: Ambulatory Visit | Attending: "Endocrinology | Admitting: "Endocrinology

## 2023-12-08 ENCOUNTER — Other Ambulatory Visit (HOSPITAL_COMMUNITY)
Admission: RE | Admit: 2023-12-08 | Discharge: 2023-12-08 | Disposition: A | Discharge status: Home / Self Care | Source: Ambulatory Visit | Attending: "Endocrinology | Admitting: "Endocrinology

## 2023-12-08 DIAGNOSIS — C73 Malignant neoplasm of thyroid gland: Secondary | ICD-10-CM | POA: Insufficient documentation

## 2023-12-08 LAB — T4, FREE: Free T4: 1.07 ng/dL (ref 0.61–1.12)

## 2023-12-08 LAB — TSH: TSH: 119.25 u[IU]/mL — ABNORMAL HIGH (ref 0.350–4.500)

## 2023-12-08 MED ORDER — SODIUM IODIDE I 131 CAPSULE
70.0000 | Freq: Once | INTRAVENOUS | Status: AC | PRN
  Administered 2023-12-08: 68.9 via ORAL

## 2023-12-10 LAB — THYROGLOBULIN ANTIBODY: Thyroglobulin Antibody: <1 [IU]/mL (ref 0.0–0.9)

## 2023-12-11 ENCOUNTER — Ambulatory Visit: Admitting: Family Medicine

## 2023-12-13 ENCOUNTER — Ambulatory Visit

## 2023-12-13 ENCOUNTER — Telehealth: Payer: Self-pay | Admitting: Family Medicine

## 2023-12-13 DIAGNOSIS — K219 Gastro-esophageal reflux disease without esophagitis: Secondary | ICD-10-CM | POA: Diagnosis not present

## 2023-12-13 DIAGNOSIS — M792 Neuralgia and neuritis, unspecified: Secondary | ICD-10-CM | POA: Diagnosis not present

## 2023-12-13 DIAGNOSIS — R600 Localized edema: Secondary | ICD-10-CM

## 2023-12-13 DIAGNOSIS — E119 Type 2 diabetes mellitus without complications: Secondary | ICD-10-CM | POA: Diagnosis not present

## 2023-12-13 MED ORDER — PANTOPRAZOLE SODIUM 40 MG PO TBEC: 40.0000 mg | DELAYED_RELEASE_TABLET | Freq: Every day | ORAL | 1 refills | Status: DC

## 2023-12-13 MED ORDER — GABAPENTIN 100 MG PO CAPS: 100.0000 mg | ORAL_CAPSULE | Freq: Every day | ORAL | 2 refills | Status: DC

## 2023-12-13 MED ORDER — CHLORTHALIDONE 25 MG PO TABS: 25.0000 mg | ORAL_TABLET | Freq: Every day | ORAL | 0 refills | Status: DC

## 2023-12-13 MED ORDER — METFORMIN HCL 1000 MG PO TABS: 1000.0000 mg | ORAL_TABLET | Freq: Two times a day (BID) | ORAL | 3 refills | Status: AC

## 2023-12-13 NOTE — Telephone Encounter (Signed)
 FMLA forms  Noted Copied Sleeved Original placed in provider box Copy placed front desk folder
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
## 2023-12-14 ENCOUNTER — Telehealth: Payer: Self-pay

## 2023-12-14 NOTE — Telephone Encounter (Signed)
 Copied from CRM [id number]. Topic: General - Other >> Dec 13, 2023  2:34 PM Harlene ORN wrote: Reason for CRM: Patient's daughter just dropped off paperwork for the PCP to complete.  Please fax the document after when it is completed.

## 2023-12-14 NOTE — Telephone Encounter (Signed)
 Put in providers folder

## 2023-12-15 ENCOUNTER — Other Ambulatory Visit: Payer: Self-pay | Admitting: "Endocrinology

## 2023-12-18 ENCOUNTER — Ambulatory Visit: Admitting: "Endocrinology

## 2023-12-18 ENCOUNTER — Encounter (HOSPITAL_COMMUNITY)
Admission: RE | Admit: 2023-12-18 | Discharge: 2023-12-18 | Disposition: A | Discharge status: Home / Self Care | Source: Ambulatory Visit | Attending: "Endocrinology | Admitting: "Endocrinology

## 2023-12-18 DIAGNOSIS — C73 Malignant neoplasm of thyroid gland: Secondary | ICD-10-CM | POA: Insufficient documentation

## 2023-12-20 LAB — THYROGLOBULIN LEVEL: Thyroglobulin: 7.4 ng/mL

## 2023-12-21 NOTE — Congregational Nurse Program (Signed)
 Pt participated in [place] [employer] event by way of requesting a blood pressure screening  Pt states she is currently on BP meds and monitor her BP at home.  States she had not taken her meds prior to the event   Plan -checked BP and was 151/89 with HR 88 -Provided pt with BP handout on how to improve BP numbers -Provided pt wih blood pressure log as reminder to record bp readings to be able to discuss with her PCP -pt encouraged to self monitor at home

## 2023-12-22 NOTE — Assessment & Plan Note (Signed)
 Denies polyuria, polyphagia, polydipsia She takes metformin  at 1000 mg twice daily and glimepiride  2 mg daily Recommend avoiding simple carbohydrates, including cakes, sweet desserts, ice cream, soda (diet or regular), sweet tea, candies, chips, cookies, store-bought juices, alcohol in excess of 1-2 drinks a day, lemonade, artificial sweeteners, donuts, coffee creamers, and sugar-free products.  I recommend avoiding greasy, fatty foods with increased physical activity.

## 2023-12-22 NOTE — Assessment & Plan Note (Signed)
 Stable with Protonix 40 mg PRN She denies dyspepsia and heartburn  Encouraged to continue treatment regimen GERD diet encouraged

## 2023-12-22 NOTE — Assessment & Plan Note (Addendum)
 Continue with gabapentin  100 mg.  Refills provided today.

## 2023-12-25 ENCOUNTER — Encounter: Payer: Self-pay | Admitting: "Endocrinology

## 2023-12-25 ENCOUNTER — Ambulatory Visit (INDEPENDENT_AMBULATORY_CARE_PROVIDER_SITE_OTHER): Admitting: "Endocrinology

## 2023-12-25 VITALS — BP 132/80 | HR 60 | Ht 63.0 in | Wt 336.6 lb

## 2023-12-25 DIAGNOSIS — I10 Essential (primary) hypertension: Secondary | ICD-10-CM

## 2023-12-25 DIAGNOSIS — E119 Type 2 diabetes mellitus without complications: Secondary | ICD-10-CM

## 2023-12-25 DIAGNOSIS — Z7984 Long term (current) use of oral hypoglycemic drugs: Secondary | ICD-10-CM

## 2023-12-25 DIAGNOSIS — E890 Postprocedural hypothyroidism: Secondary | ICD-10-CM

## 2023-12-25 DIAGNOSIS — C73 Malignant neoplasm of thyroid gland: Secondary | ICD-10-CM

## 2023-12-25 MED ORDER — LEVOTHYROXINE SODIUM 175 MCG PO TABS: 175.0000 ug | ORAL_TABLET | Freq: Every day | ORAL | 1 refills | Status: DC

## 2023-12-25 NOTE — Progress Notes (Signed)
 11/13/2023   Endocrinology follow-up note  Subjective:    Patient ID: Norma Horton, female    DOB: Sep 20, 1970, PCP Zarwolo, Gloria, FNP   Past Medical History:  Diagnosis Date   Anxiety    Asthma    Depression    Pt has had suicide ideations in the past.   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   Past Surgical History:  Procedure Laterality Date   BIOPSY THYROID   03/2023   CHOLECYSTECTOMY     THYROIDECTOMY N/A 08/25/2023   Procedure: TOTAL THYROIDECTOMY;  Surgeon: Mavis Anes, MD;  Location: [facility];  Service: General;  Laterality: N/A;  TOTAL   TUBAL LIGATION     Social History   Socioeconomic History   Marital status: Divorced    Spouse name: Not on file   Number of children: 4   Years of education: Not on file   Highest education level: Not on file  Occupational History   Not on file  Tobacco Use   Smoking status: Never   Smokeless tobacco: Never  Vaping Use   Vaping status: Never Used  Substance and Sexual Activity   Alcohol use: Not Currently    Comment: Denied use as of 04/20/2023   Drug use: No   Sexual activity: Yes    Birth control/protection: Surgical, Post-menopausal    Comment: tubal  Other Topics Concern   Not on file  Social History Narrative   Lives with her husband and her children    Social Drivers of Health   Financial Resource Strain: Medium Risk (02/03/2022)   Overall Financial Resource Strain (CARDIA)    Difficulty of Paying Living Expenses: Somewhat hard  Food Insecurity: No Food Insecurity (08/25/2023)   Hunger Vital Sign    Worried About Running Out of Food in the Last Year: Never true    Ran Out of Food in the Last Year: Never true  Transportation Needs: No Transportation Needs (08/25/2023)   PRAPARE - Administrator, Civil Service (Medical): No    Lack of Transportation (Non-Medical): No  Physical Activity: Inactive  (02/03/2022)   Exercise Vital Sign    Days of Exercise per Week: 0 days    Minutes of Exercise per Session: 0 min  Stress: Stress Concern Present (02/03/2022)   [name]    Feeling of Stress : Very much  Social Connections: Moderately Isolated (02/03/2022)   Social Connection and Isolation Panel    Frequency of Communication with Friends and Family: More than three times a week    Frequency of Social Gatherings with Friends and Family: Twice a week    Attends Religious Services: Never    Database administrator or Organizations: No    Attends Engineer, structural: Never    Marital Status: Married   Family History  Problem Relation Age of Onset   Breast cancer Maternal Grandmother    Heart attack Father    Hypertension Father    COPD Mother    Diabetes Mellitus II Brother    Throat cancer Brother    Cerebral aneurysm Sister    Cervical cancer Sister  Cervical cancer Sister    Heart attack Sister    Diabetes Daughter    Asthma Daughter    Cystic fibrosis Daughter    Hypertension Other    Outpatient Encounter Medications as of 12/25/2023  Medication Sig   albuterol  (VENTOLIN  HFA) 108 (90 Base) MCG/ACT inhaler Inhale 2 puffs into the lungs every 6 (six) hours as needed for wheezing or shortness of breath.   budesonide -formoterol  (BREYNA ) 80-4.5 MCG/ACT inhaler Inhale 2 puffs into the lungs 2 (two) times daily.   calcium  carbonate (OS-CAL - DOSED IN MG OF ELEMENTAL CALCIUM ) 1250 (500 Ca) MG tablet Take 1 tablet (1,250 mg total) by mouth 2 (two) times daily with a meal.   chlorthalidone  (HYGROTON ) 25 MG tablet Take 1 tablet (25 mg total) by mouth daily.   Cyanocobalamin (B-12 PO) Take 1 tablet by mouth daily.   gabapentin  (NEURONTIN ) 100 MG capsule Take 1 capsule (100 mg total) by mouth at bedtime.   Insulin  Pen Needle (BD PEN NEEDLE NANO U/F) 32G X 4 MM MISC 1 each by Does not apply route 4 (four) times  daily.   levothyroxine  (SYNTHROID ) 175 MCG tablet Take 1 tablet (175 mcg total) by mouth daily before breakfast.   metFORMIN  (GLUCOPHAGE ) 1000 MG tablet Take 1 tablet (1,000 mg total) by mouth 2 (two) times daily with a meal.   montelukast  (SINGULAIR ) 10 MG tablet Take 1 tablet (10 mg total) by mouth at bedtime.   oxyCODONE  (ROXICODONE ) 5 MG immediate release tablet Take 1 tablet (5 mg total) by mouth every 4 (four) hours as needed.   pantoprazole  (PROTONIX ) 40 MG tablet Take 1 tablet (40 mg total) by mouth daily.   sertraline  (ZOLOFT ) 100 MG tablet Take 1 tablet (100 mg total) by mouth daily.   valACYclovir (VALTREX) 500 MG tablet Take 500 mg by mouth daily.   Vitamin D , Ergocalciferol , (DRISDOL ) 1.25 MG (50000 UNIT) CAPS capsule Take 1 capsule (50,000 Units total) by mouth every 7 (seven) days.   [DISCONTINUED] levothyroxine  (SYNTHROID ) 150 MCG tablet Take 1 tablet (150 mcg total) by mouth daily before breakfast.   No facility-administered encounter medications on file as of 12/25/2023.   ALLERGIES: Allergies  Allergen Reactions   Fenoprofen Calcium      VACCINATION STATUS: Immunization History  Administered Date(s) Administered   Tdap 05/23/1998, 11/16/2021   Zoster Recombinant(Shingrix ) 01/21/2022, 04/22/2022    HPI Norma Horton is 53 y.o. female who presents today with a medical history as above. she is being seen in follow-up after she was seen in consultation for multinodular goiter requested by Zarwolo, Gloria, FNP.  She is status post total thyroidectomy for thyroid  malignancy on August 25, 2023. See notes from previous visit.  She has history of multinodular goiter dating back to 2017.   She did have biopsy of a  right lobe nodule where results were reported to be atypia of undetermined significance.  Her recent repeat thyroid  ultrasound in July 2024  did not show significant change in the size of the nodule-6.4 x 4.7 x 3cm. She was sent for repeat FNA after her last visit.  Her FNA was sent for Afirma, which resulted in ~50% risk of malignancy.  She was approached for surgical treatment and her significant Histuss, she agreed for a referral to a Careers adviser.   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  However, tumor was present at inked edge.   She is now status post Thyrogen  stimulated remnant ablation with  posttherapy whole-body scan. FINDINGS: Intense uptake within the thyroid  bed. No evidence abnormal radiotracer outside of the thyroid  bed.  Findings suggesting thyroid  remnant within thyroid  bed however cannot completely exclude local adenopathy.  Her stimulated thyroglobulin is not suppressed at 7.4.  No evidence of distant metastasis.    She presents with improvement in her previously noted dysphagia, occasional shortness of breath, and dysphonia. She is currently on levothyroxine  150 mcg p.o. daily before breakfast.  Her previsit thyroid  function tests are consistent with under replacement.    She has no family history of thyroid  dysfunction or thyroid  malignancy.  Her other medical problems include type 2 diabetes on metformin  1000 mg p.o. twice daily.  Her point-of-care A1c today is 7.2%, stable compared to her previous presentations.  She has hypertension on treatment with hydrochlorothiazide .   Review of Systems  Constitutional:  No recent weight change, no fatigue, no subjective hyperthermia, no subjective hypothermia Eyes: no blurry vision, no xerophthalmia    Objective:       12/25/2023    8:38 AM 12/16/2023   11:00 AM 12/13/2023   10:17 AM  Vitals with BMI  Height 5' 3  5' 3  Weight 336 lbs 10 oz  338 lbs  BMI 59.64  59.89  Systolic 132 151 861  Diastolic 80 89 95  Pulse 60 88 69    BP 132/80   Pulse 60   Ht 5' 3 (1.6 m)   Wt (!) 336 lb 9.6 oz (152.7 kg)   LMP 08/30/2016   BMI 59.63 kg/m   Wt Readings from Last 3 Encounters:  12/25/23 (!) 336 lb 9.6 oz (152.7  kg)  12/13/23 (!) 338 lb (153.3 kg)  11/13/23 (!) 337 lb (152.9 kg)    Physical Exam  Constitutional:  Body mass index is 59.63 kg/m.,  not in acute distress, normal state of mind Eyes: PERRLA, EOMI, no exophthalmos ENT: moist mucous membranes, + gross thyromegaly, no gross cervical lymphadenopathy   CMP ( most recent) CMP     Component Value Date/Time   NA 137 11/02/2023 0900   K 3.9 11/02/2023 0900   CL 95 (L) 11/02/2023 0900   CO2 25 11/02/2023 0900   GLUCOSE 139 (H) 11/02/2023 0900   GLUCOSE 145 (H) 08/27/2023 0441   BUN 14 11/02/2023 0900   CREATININE 0.82 11/02/2023 0900   CALCIUM  9.9 11/02/2023 0900   PROT 7.5 11/02/2023 0900   ALBUMIN 4.2 11/02/2023 0900   AST 28 11/02/2023 0900   ALT 41 (H) 11/02/2023 0900   ALKPHOS 120 11/02/2023 0900   BILITOT 0.5 11/02/2023 0900   GFRNONAA >60 08/27/2023 0441   GFRAA >60 11/06/2010 2348    Thyroid  ultrasound on November 19, 2015 FINDINGS: Right thyroid  lobe   Measurements: 6.8 x 3.8 x 4.5 cm. Right upper pole predominately solid nodule measures 4.9 x 3.7 x 4.5 cm   Left thyroid  lobe : Measurements: 4.9 x 1.5 x 1.4 cm. Heterogeneous tissue without focal  nodule.   Isthmus : Thickness: 0.3 cm.  No nodules visualized.   Lymphadenopathy: None visualized.   IMPRESSION: Solitary large right lobe nodule measures 4.9 cm. Findings meet consensus criteria for biopsy. Ultrasound-guided fine needle aspiration should be considered, as per the consensus statement: Management of Thyroid  Nodules Detected at US : Society of Radiologists in Ultrasound Consensus Conference Statement. Radiology 2005; [id number].  Final needle aspiration biopsy of thyroid  nodules in August 2017 COMMENT: THE SPECIMEN IS HYPERCELLULAR AND CONSISTS OF SMALL AND MEDIUM SIZED GROUPS OF FOLLICULAR  EPITHELIAL CELLS WITH CYTOLOGIC ATYPIA, INCLUDING NUCLEAR ENLARGEMENT AND OVERLAP.    Patient did not want surgery.  Repeat thyroid  ultrasound was performed on  January 21, 2022 showing   Isthmus: 0.3 cm ,previously 0.3 cm   Right lobe: 7.7 x 3.9 x 4.7 cm ,previously 6.8 x 3.8 x 4.5 cm,    Left lobe: 5.2 x 1.1 x 1.5 cm ,previously 4.9 x 1.5 x 1.4 cm  Nodule in the right lobe of her thyroid  size: 6.4 cm; Other 2 dimensions: 4.8 x 3.8 cm, previously,  4.9 x 3.7 x 4.5 cm  FNA 03/20/23 Clinical History: 6.4 cm RML; TI-RADS risk category: TR4 (4-6 pts.)  Specimen Submitted:  A. THYROID , RML, FINE NEEDLE ASPIRATION:    FINAL MICROSCOPIC DIAGNOSIS:  - Atypia of undetermined significance (Bethesda category III)   Afirma: GSC Classifier Candia Classifier - Suspicious ( ~ 50% risk of malignancy).   Thyroidectomy on August 25, 2023 FINAL MICROSCOPIC DIAGNOSIS:   A. THYROID , LEFT, LOBECTOMY:  - Thyroid  follicular hyperplasia/adenomatous nodule  - No malignancy identified   B. THYROID , RIGHT, LOBECTOMY:  - Papillary thyroid  carcinoma, classic type, 0.9 cm  - Tumor is present at inked edge  - No lymphovascular invasion identified  - Thyroid  follicular nodular disease/adenomatous nodules  - See oncology table  Diabetic Labs (most recent): Lab Results  Component Value Date   HGBA1C 7.2 (A) 11/13/2023   HGBA1C 7.0 (H) 08/23/2023   HGBA1C 7.1 (H) 03/03/2023     Lab Results  Component Value Date   TSH 119.250 (H) 12/08/2023   TSH 10.000 (H) 11/02/2023   TSH 2.494 08/23/2023   TSH 3.280 03/03/2023   TSH 3.390 03/03/2023   TSH 2.920 10/31/2022   TSH 3.640 08/10/2022   TSH 2.300 07/25/2022   TSH 2.750 04/22/2022   TSH 3.900 01/21/2022   FREET4 1.07 12/08/2023   FREET4 1.33 11/02/2023   FREET4 1.17 03/03/2023   FREET4 1.16 03/03/2023   FREET4 1.15 10/31/2022   FREET4 1.22 08/10/2022   FREET4 1.28 07/25/2022   FREET4 1.21 04/22/2022   FREET4 1.15 01/21/2022   FREET4 1.14 11/16/2021    She is status post Thyrogen  stimulated remnant ablation utilizing 66.9 mCi of I-131 FINDINGS: Intense uptake within the thyroid  bed. No evidence  abnormal radiotracer outside of the thyroid  bed.   IMPRESSION: 1. Findings most consistent with thyroid  remnant within the thyroid  bed. Cannot completely exclude local adenopathy. Recommend following thyroglobulin levels. 2. No evidence of metastatic disease.  Assessment & Plan:   1.  Thyroid  malignancy   2.  Postsurgical hypothyroidism  3.  Morbid obesity   4  Hypertension   5.  Type 2 diabetes  6. Vitamin D  deficiency   - Based on these reviews, she has history of nodular goiter, growing overall, and growing nodule on the right lobe.  This right lobe nodule was previously biopsied with bethesda 3 findings in 2017.  After she was approached for a repeat biopsy of a 6.4 cm nodule in July 2024, she opted to avoid it.  She was re-approached due to the fact that she reported occasional compressive symptoms. Hide biopsy results on March 20, 2023 was consistent with atypia of undetermined significance.  A sample was sent for molecular testing with Afirma gene sequencing classifier which showed approximately 50% risk of malignancy. 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  I had a long discussion with her about treatment course and expectations  for differentiated thyroid  cancer. She is status post treatment with 68.9 mCi of I-131 for thyroid  remnant ablation followed by whole-body scan. She has detectable thyroglobulin at 7.4 and post I-131 whole-body scan revealed possible thyroid  remnant but could not rule out local adenopathy.  No evidence of metastatic disease.  She is offered detailed neck/thyroid  bed ultrasound for better assessment in light of detectable thyroglobulin.  - For her postsurgical hypothyroidism she will need a higher dose of levothyroxine .  I discussed and increased her levothyroxine  to 175 mcg p.o. daily before breakfast.     - We discussed about the correct intake of her thyroid  hormone, on  empty stomach at fasting, with water , separated by at least 30 minutes from breakfast and other medications,  and separated by more than 4 hours from calcium , iron, multivitamins, acid reflux medications (PPIs). -Patient is made aware of the fact that thyroid  hormone replacement is needed for life, dose to be adjusted by periodic monitoring of thyroid  function tests.   -For her diabetes type 2 complicated by morbid obesity, she will benefit from GLP-1 receptor agonists which she will be considered for after completion of her thyroid  cancer treatment.  Her BMI is 59.63. In the meantime, she is advised to continue metformin  1000 mg p.o. twice daily.  Her recent A1c was 7.2%.    - Her blood pressure was controlled to target.  She is advised to continue chlorthalidone  25 mg p.o. daily at breakfast.  She is also encouraged to continue on her vitamin D2 50,000 units weekly.   - she is advised to maintain close follow up with Zarwolo, Gloria, FNP for primary care needs.  I spent  40 minutes in the care of the patient today including review of labs from Thyroid  Function, CMP, and other relevant labs ; imaging/biopsy records (current and previous including abstractions from other facilities); face-to-face time discussing  her lab results and symptoms, medications doses, her options of short and long term treatment based on the latest standards of care / guidelines;   and documenting the encounter.  Norma Horton  participated in the discussions, expressed understanding, and voiced agreement with the above plans.  All questions were answered to her satisfaction. she is encouraged to contact clinic should she have any questions or concerns prior to her return visit.   Follow up plan: Return in about 4 weeks (around 01/22/2024) for Thyroid  / Neck Ultrasound, Fasting Labs  in AM B4 8.   Ranny Earl, MD [facility] [address] [place], [place]  [address] Phone: [phone]  Fax: [phone]     12/25/2023, 8:59 AM  This note was partially dictated with voice recognition software. Similar sounding words can be transcribed inadequately or may not  be corrected upon review.

## 2024-01-05 ENCOUNTER — Ambulatory Visit (HOSPITAL_COMMUNITY)
Admission: RE | Admit: 2024-01-05 | Discharge: 2024-01-05 | Disposition: A | Discharge status: Home / Self Care | Source: Ambulatory Visit | Attending: "Endocrinology | Admitting: "Endocrinology

## 2024-01-05 DIAGNOSIS — C73 Malignant neoplasm of thyroid gland: Secondary | ICD-10-CM | POA: Insufficient documentation

## 2024-01-25 ENCOUNTER — Ambulatory Visit: Admitting: "Endocrinology

## 2024-01-30 ENCOUNTER — Telehealth: Payer: Self-pay | Admitting: Family Medicine

## 2024-01-30 NOTE — Telephone Encounter (Signed)
 FMLA forms  Noted Copied Sleeved Original copy placed in provider box Copy placed in front desk folder

## 2024-01-31 LAB — LIPID PANEL
Chol/HDL Ratio: 3.4 ratio (ref 0.0–4.4)
Cholesterol, Total: 191 mg/dL (ref 100–199)
HDL: 56 mg/dL (ref >39)
LDL Chol Calc (NIH): 112 mg/dL — ABNORMAL HIGH (ref 0–99)
Triglycerides: 133 mg/dL (ref 0–149)
VLDL Cholesterol Cal: 23 mg/dL (ref 5–40)

## 2024-01-31 LAB — COMPREHENSIVE METABOLIC PANEL WITH GFR
ALT: 54 IU/L — ABNORMAL HIGH (ref 0–32)
AST: 62 IU/L — ABNORMAL HIGH (ref 0–40)
Albumin: 4 g/dL (ref 3.8–4.9)
Alkaline Phosphatase: 100 IU/L (ref 49–135)
BUN/Creatinine Ratio: 22 (ref 9–23)
BUN: 16 mg/dL (ref 6–24)
Bilirubin Total: 0.8 mg/dL (ref 0.0–1.2)
CO2: 22 mmol/L (ref 20–29)
Calcium: 10 mg/dL (ref 8.7–10.2)
Chloride: 96 mmol/L (ref 96–106)
Creatinine, Ser: 0.74 mg/dL (ref 0.57–1.00)
Globulin, Total: 3.6 g/dL (ref 1.5–4.5)
Glucose: 148 mg/dL — ABNORMAL HIGH (ref 70–99)
Potassium: 4.3 mmol/L (ref 3.5–5.2)
Sodium: 138 mmol/L (ref 134–144)
Total Protein: 7.6 g/dL (ref 6.0–8.5)
eGFR: 95 mL/min/1.73 (ref >59)

## 2024-01-31 LAB — T4, FREE: Free T4: 1.75 ng/dL (ref 0.82–1.77)

## 2024-01-31 LAB — FIB-4 W/REFLEX TO ELF
FIB-4 Index: 1.65 (ref 0.00–2.67)
Platelets: 281 x10E3/uL (ref 150–450)

## 2024-01-31 LAB — THYROID STIMULATING IMMUNOGLOBULIN: Thyroid Stim Immunoglobulin: <0.1 IU/L (ref 0.00–0.55)

## 2024-01-31 LAB — TSH: TSH: 0.337 u[IU]/mL — ABNORMAL LOW (ref 0.450–4.500)

## 2024-01-31 LAB — ENHANCED LIVER FIBROSIS (ELF): ELF(TM) Score: 9.93 — ABNORMAL HIGH (ref <9.80)

## 2024-01-31 LAB — THYROGLOBULIN ANTIBODY: Thyroglobulin Antibody: <1 [IU]/mL (ref 0.0–0.9)

## 2024-02-01 NOTE — Telephone Encounter (Signed)
 Put in Gloria's box for completion

## 2024-02-08 ENCOUNTER — Ambulatory Visit: Admitting: Family Medicine

## 2024-02-08 ENCOUNTER — Other Ambulatory Visit: Payer: Self-pay | Admitting: "Endocrinology

## 2024-02-08 ENCOUNTER — Encounter: Payer: Self-pay | Admitting: Family Medicine

## 2024-02-08 VITALS — BP 142/80 | HR 61 | Resp 16 | Ht 63.0 in | Wt 330.0 lb

## 2024-02-08 DIAGNOSIS — B349 Viral infection, unspecified: Secondary | ICD-10-CM | POA: Diagnosis not present

## 2024-02-08 DIAGNOSIS — E119 Type 2 diabetes mellitus without complications: Secondary | ICD-10-CM | POA: Diagnosis not present

## 2024-02-08 DIAGNOSIS — J4541 Moderate persistent asthma with (acute) exacerbation: Secondary | ICD-10-CM | POA: Diagnosis not present

## 2024-02-08 DIAGNOSIS — Z1231 Encounter for screening mammogram for malignant neoplasm of breast: Secondary | ICD-10-CM

## 2024-02-08 DIAGNOSIS — I10 Essential (primary) hypertension: Secondary | ICD-10-CM

## 2024-02-08 MED ORDER — PREDNISONE 20 MG PO TABS: 20.0000 mg | ORAL_TABLET | Freq: Every day | ORAL | 0 refills | Status: AC

## 2024-02-08 MED ORDER — PROMETHAZINE-DM 6.25-15 MG/5ML PO SYRP: 5.0000 mL | ORAL_SOLUTION | Freq: Four times a day (QID) | ORAL | 0 refills | Status: AC | PRN

## 2024-02-08 MED ORDER — ALBUTEROL SULFATE HFA 108 (90 BASE) MCG/ACT IN AERS: 2.0000 | INHALATION_SPRAY | Freq: Four times a day (QID) | RESPIRATORY_TRACT | 1 refills | Status: DC | PRN

## 2024-02-08 NOTE — Assessment & Plan Note (Signed)
-  Start promethazine -DM for cough and congestion. -Start prednisone  for 5 days to decrease airway inflammation and congestion.  Supportive Care: -Increase fluid intake to help thin mucus. -Use saline nasal spray or steam inhalation for sinus drainage. -Get adequate rest. -Use a humidifier to ease congestion.  Monitoring: -Monitor for fever, worsening cough, shortness of breath, or chest pain. -If symptoms persist beyond 10 days or worsen, follow up for further evaluation.

## 2024-02-08 NOTE — Assessment & Plan Note (Addendum)
 SABRA

## 2024-02-08 NOTE — Assessment & Plan Note (Signed)
 Encouraged to continue metformin  1000 mg twice daily. Encouraged to decrease intake of high-sugar foods and beverages and increase physical activity

## 2024-02-08 NOTE — Assessment & Plan Note (Signed)
 Uncontrolled  She takes chlorthalidone  25 mg daily She reports compliance with treatment regimen No changes to treatment regimen today Will continue to monitor A low-sodium diet of less than 2300 mg daily is recommended, along with increased physical activity of moderate intensity, aiming for 150 minutes weekly.  BP Readings from Last 3 Encounters:  02/08/24 (!) 142/80  12/25/23 132/80  12/16/23 (!) 151/89

## 2024-02-08 NOTE — Progress Notes (Signed)
 Established Patient Office Visit  Subjective:  Patient ID: Norma Horton, female    DOB: [birth year]  Age: 53 y.o. MRN: [id number]  CC:  Chief Complaint  Patient presents with   Medical Management of Chronic Issues    4 month follow up    Cough    Complains of non productive cough and sinus drainage x4 days. Denies fever. Taking otc mucinex      HPI Norma Horton is a 53 y.o. female with past medical history of asthma, type 2 diabetes, hypertension, hyperlipidemia  presents for f/u of  chronic medical conditions with the above complaints.  For the details of today's visit, please refer to the assessment and plan.     Past Medical History:  Diagnosis Date   Anxiety    Asthma    Depression    Pt has had suicide ideations in the past.   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    Past Surgical History:  Procedure Laterality Date   BIOPSY THYROID   03/2023   CHOLECYSTECTOMY     THYROIDECTOMY N/A 08/25/2023   Procedure: TOTAL THYROIDECTOMY;  Surgeon: Mavis Anes, MD;  Location: [facility];  Service: General;  Laterality: N/A;  TOTAL   TUBAL LIGATION      Family History  Problem Relation Age of Onset   Breast cancer Maternal Grandmother    Heart attack Father    Hypertension Father    COPD Mother    Diabetes Mellitus II Brother    Throat cancer Brother    Cerebral aneurysm Sister    Cervical cancer Sister    Cervical cancer Sister    Heart attack Sister    Diabetes Daughter    Asthma Daughter    Cystic fibrosis Daughter    Hypertension Other     Social History   Socioeconomic History   Marital status: Divorced    Spouse name: Not on file   Number of children: 4   Years of education: Not on file   Highest education level: Not on file  Occupational History   Not on file  Tobacco Use   Smoking status: Never   Smokeless tobacco: Never  Vaping Use   Vaping status: Never Used  Substance and Sexual Activity    Alcohol use: Not Currently    Comment: Denied use as of 04/20/2023   Drug use: No   Sexual activity: Yes    Birth control/protection: Surgical, Post-menopausal    Comment: tubal  Other Topics Concern   Not on file  Social History Narrative   Lives with her husband and her children    Social Drivers of Health   Financial Resource Strain: Medium Risk (02/03/2022)   Overall Financial Resource Strain (CARDIA)    Difficulty of Paying Living Expenses: Somewhat hard  Food Insecurity: No Food Insecurity (08/25/2023)   Hunger Vital Sign    Worried About Running Out of Food in the Last Year: Never true    Ran Out of Food in the Last Year: Never true  Transportation Needs: No Transportation Needs (08/25/2023)   PRAPARE - Administrator, Civil Service (Medical): No    Lack of Transportation (Non-Medical): No  Physical Activity: Inactive (02/03/2022)   Exercise Vital Sign    Days of Exercise per Week: 0 days    Minutes of Exercise per Session: 0 min  Stress: Stress Concern Present (02/03/2022)  [name]    Feeling of Stress : Very much  Social Connections: Moderately Isolated (02/03/2022)   Social Connection and Isolation Panel    Frequency of Communication with Friends and Family: More than three times a week    Frequency of Social Gatherings with Friends and Family: Twice a week    Attends Religious Services: Never    Database administrator or Organizations: No    Attends Banker Meetings: Never    Marital Status: Married  Catering manager Violence: Not At Risk (08/25/2023)   Humiliation, Afraid, Rape, and Kick questionnaire    Fear of Current or Ex-Partner: No    Emotionally Abused: No    Physically Abused: No    Sexually Abused: No    Outpatient Medications Prior to Visit  Medication Sig Dispense Refill   budesonide -formoterol  (BREYNA ) 80-4.5 MCG/ACT inhaler Inhale 2 puffs into the lungs 2  (two) times daily. 10.2 g 3   calcium  carbonate (OS-CAL - DOSED IN MG OF ELEMENTAL CALCIUM ) 1250 (500 Ca) MG tablet Take 1 tablet (1,250 mg total) by mouth 2 (two) times daily with a meal. 60 tablet 1   chlorthalidone  (HYGROTON ) 25 MG tablet Take 1 tablet (25 mg total) by mouth daily. 90 tablet 0   Cyanocobalamin (B-12 PO) Take 1 tablet by mouth daily.     gabapentin  (NEURONTIN ) 100 MG capsule Take 1 capsule (100 mg total) by mouth at bedtime. 30 capsule 2   Insulin  Pen Needle (BD PEN NEEDLE NANO U/F) 32G X 4 MM MISC 1 each by Does not apply route 4 (four) times daily. 100 each 1   levothyroxine  (SYNTHROID ) 175 MCG tablet Take 1 tablet (175 mcg total) by mouth daily before breakfast. 90 tablet 1   metFORMIN  (GLUCOPHAGE ) 1000 MG tablet Take 1 tablet (1,000 mg total) by mouth 2 (two) times daily with a meal. 180 tablet 3   montelukast  (SINGULAIR ) 10 MG tablet Take 1 tablet (10 mg total) by mouth at bedtime. 30 tablet 0   oxyCODONE  (ROXICODONE ) 5 MG immediate release tablet Take 1 tablet (5 mg total) by mouth every 4 (four) hours as needed. 20 tablet 0   pantoprazole  (PROTONIX ) 40 MG tablet Take 1 tablet (40 mg total) by mouth daily. 60 tablet 1   sertraline  (ZOLOFT ) 100 MG tablet Take 1 tablet (100 mg total) by mouth daily. 30 tablet 5   valACYclovir (VALTREX) 500 MG tablet Take 500 mg by mouth daily.     Vitamin D , Ergocalciferol , (DRISDOL ) 1.25 MG (50000 UNIT) CAPS capsule Take 1 capsule (50,000 Units total) by mouth every 7 (seven) days. 20 capsule 1   albuterol  (VENTOLIN  HFA) 108 (90 Base) MCG/ACT inhaler Inhale 2 puffs into the lungs every 6 (six) hours as needed for wheezing or shortness of breath. 6.7 each 1   No facility-administered medications prior to visit.    Allergies  Allergen Reactions   Fenoprofen Calcium      ROS Review of Systems  Constitutional:  Negative for chills and fever.  HENT:  Positive for congestion and rhinorrhea. Negative for postnasal drip, sinus pain and sore  throat.   Eyes:  Negative for visual disturbance.  Respiratory:  Positive for cough and wheezing. Negative for chest tightness and shortness of breath.   Neurological:  Negative for dizziness and headaches.      Objective:    Physical Exam HENT:     Head: Normocephalic.     Mouth/Throat:  Mouth: Mucous membranes are moist.  Cardiovascular:     Rate and Rhythm: Normal rate.     Heart sounds: Normal heart sounds.  Pulmonary:     Effort: Pulmonary effort is normal.     Breath sounds: Wheezing present.  Neurological:     Mental Status: She is alert.     BP (!) 142/80   Pulse 61   Resp 16   Ht 5' 3 (1.6 m)   Wt (!) 330 lb 0.6 oz (149.7 kg)   LMP 08/30/2016   SpO2 96%   BMI 58.46 kg/m  Wt Readings from Last 3 Encounters:  02/08/24 (!) 330 lb 0.6 oz (149.7 kg)  12/25/23 (!) 336 lb 9.6 oz (152.7 kg)  12/13/23 (!) 338 lb (153.3 kg)    Lab Results  Component Value Date   TSH 0.337 (L) 01/24/2024   Lab Results  Component Value Date   WBC 10.4 08/27/2023   HGB 12.6 08/27/2023   HCT 40.7 08/27/2023   MCV 98.1 08/27/2023   PLT 281 01/24/2024   Lab Results  Component Value Date   NA 138 01/24/2024   K 4.3 01/24/2024   CO2 22 01/24/2024   GLUCOSE 148 (H) 01/24/2024   BUN 16 01/24/2024   CREATININE 0.74 01/24/2024   BILITOT 0.8 01/24/2024   ALKPHOS 100 01/24/2024   AST 62 (H) 01/24/2024   ALT 54 (H) 01/24/2024   PROT 7.6 01/24/2024   ALBUMIN 4.0 01/24/2024   CALCIUM  10.0 01/24/2024   ANIONGAP 11 08/27/2023   EGFR 95 01/24/2024   Lab Results  Component Value Date   CHOL 191 01/24/2024   Lab Results  Component Value Date   HDL 56 01/24/2024   Lab Results  Component Value Date   LDLCALC 112 (H) 01/24/2024   Lab Results  Component Value Date   TRIG 133 01/24/2024   Lab Results  Component Value Date   CHOLHDL 3.4 01/24/2024   Lab Results  Component Value Date   HGBA1C 7.2 (A) 11/13/2023      Assessment & Plan:  Essential hypertension,  benign Assessment & Plan: Uncontrolled  She takes chlorthalidone  25 mg daily She reports compliance with treatment regimen No changes to treatment regimen today Will continue to monitor A low-sodium diet of less than 2300 mg daily is recommended, along with increased physical activity of moderate intensity, aiming for 150 minutes weekly.  BP Readings from Last 3 Encounters:  02/08/24 (!) 142/80  12/25/23 132/80  12/16/23 (!) 151/89      Type 2 diabetes mellitus without complication, without long-term current use of insulin  (HCC) Assessment & Plan: Encouraged to continue metformin  1000 mg twice daily. Encouraged to decrease intake of high-sugar foods and beverages and increase physical activity   Orders: -     HM Diabetes Foot Exam  Viral illness Assessment & Plan: -Start promethazine -DM for cough and congestion. -Start prednisone  for 5 days to decrease airway inflammation and congestion.  Supportive Care: -Increase fluid intake to help thin mucus. -Use saline nasal spray or steam inhalation for sinus drainage. -Get adequate rest. -Use a humidifier to ease congestion.  Monitoring: -Monitor for fever, worsening cough, shortness of breath, or chest pain. -If symptoms persist beyond 10 days or worsen, follow up for further evaluation.   Orders: -     Promethazine -DM; Take 5 mLs by mouth 4 (four) times daily as needed.  Dispense: 118 mL; Refill: 0 -     predniSONE ; Take 1 tablet (20 mg total) by mouth  daily with breakfast for 5 days.  Dispense: 5 tablet; Refill: 0  Moderate persistent asthma with exacerbation Assessment & Plan: Refilled rescue inhaler. Encouraged to continue using the daily maintenance inhaler as prescribed. Will provide a short supply of oral prednisone  and advised the patient to monitor blood glucose while on treatment. Encouraged to avoid known triggers, including smoke, strong odors, allergens, and respiratory irritants.  Orders: -     Albuterol   Sulfate HFA; Inhale 2 puffs into the lungs every 6 (six) hours as needed for wheezing or shortness of breath.  Dispense: 6.7 each; Refill: 1  Breast cancer screening by mammogram -     3D Screening Mammogram, Left and Right  Note: This chart has been completed using Engineer, civil (consulting) software, and while attempts have been made to ensure accuracy, certain words and phrases may not be transcribed as intended.    Follow-up: Return in about 4 months (around 06/10/2024).   Adonay Scheier  Z Bacchus, FNP

## 2024-02-08 NOTE — Patient Instructions (Addendum)
 I appreciate the opportunity to provide care to you today!    Follow up:  4 months  Labs: next visit  Schedule mammogram  Viral Upper Respiratory Infection -Start promethazine -DM for cough and congestion. -Start prednisone  for 5 days to decrease airway inflammation and congestion.  Supportive Care: -Increase fluid intake to help thin mucus. -Use saline nasal spray or steam inhalation for sinus drainage. -Get adequate rest. -Use a humidifier to ease congestion.  Monitoring: -Monitor for fever, worsening cough, shortness of breath, or chest pain. -If symptoms persist beyond 10 days or worsen, follow up for further evaluation.   Please follow up if your symptoms worsen or fail to improve. .   Please continue to a heart-healthy diet and increase your physical activities. Try to exercise for at least five days a week.    It was a pleasure to see you and I look forward to continuing to work together on your health and well-being. Please do not hesitate to call the office if you need care or have questions about your care.  In case of emergency, please visit the Emergency Department for urgent care, or contact our clinic at [phone] to schedule an appointment. We're here to help you!   Have a wonderful day and week. With Gratitude, Meade JENEANE Gerlach MSN, FNP-BC, PMHNP-BC

## 2024-02-08 NOTE — Assessment & Plan Note (Signed)
 Refilled rescue inhaler. Encouraged to continue using the daily maintenance inhaler as prescribed. Will provide a short supply of oral prednisone  and advised the patient to monitor blood glucose while on treatment. Encouraged to avoid known triggers, including smoke, strong odors, allergens, and respiratory irritants.

## 2024-02-09 ENCOUNTER — Ambulatory Visit (INDEPENDENT_AMBULATORY_CARE_PROVIDER_SITE_OTHER): Admitting: "Endocrinology

## 2024-02-09 ENCOUNTER — Encounter: Payer: Self-pay | Admitting: "Endocrinology

## 2024-02-09 VITALS — BP 116/72 | HR 64 | Ht 63.0 in | Wt 331.4 lb

## 2024-02-09 DIAGNOSIS — I10 Essential (primary) hypertension: Secondary | ICD-10-CM | POA: Diagnosis not present

## 2024-02-09 DIAGNOSIS — C73 Malignant neoplasm of thyroid gland: Secondary | ICD-10-CM

## 2024-02-09 DIAGNOSIS — E119 Type 2 diabetes mellitus without complications: Secondary | ICD-10-CM

## 2024-02-09 DIAGNOSIS — K760 Fatty (change of) liver, not elsewhere classified: Secondary | ICD-10-CM

## 2024-02-09 DIAGNOSIS — E559 Vitamin D deficiency, unspecified: Secondary | ICD-10-CM

## 2024-02-09 DIAGNOSIS — E890 Postprocedural hypothyroidism: Secondary | ICD-10-CM | POA: Diagnosis not present

## 2024-02-09 DIAGNOSIS — Z7984 Long term (current) use of oral hypoglycemic drugs: Secondary | ICD-10-CM

## 2024-02-09 MED ORDER — LEVOTHYROXINE SODIUM 175 MCG PO TABS: 175.0000 ug | ORAL_TABLET | Freq: Every day | ORAL | 1 refills | Status: DC

## 2024-02-09 NOTE — Patient Instructions (Signed)

## 2024-02-09 NOTE — Progress Notes (Signed)
 02/09/2024    Endocrinology follow-up note  Subjective:    Patient ID: Norma Horton, female    DOB: June 19, 1970, PCP Bacchus, Meade PEDLAR, FNP   Past Medical History:  Diagnosis Date   Anxiety    Asthma    Depression    Pt has had suicide ideations in the past.   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   Past Surgical History:  Procedure Laterality Date   BIOPSY THYROID   03/2023   CHOLECYSTECTOMY     THYROIDECTOMY N/A 08/25/2023   Procedure: TOTAL THYROIDECTOMY;  Surgeon: Mavis Anes, MD;  Location: [facility];  Service: General;  Laterality: N/A;  TOTAL   TUBAL LIGATION     Social History   Socioeconomic History   Marital status: Divorced    Spouse name: Not on file   Number of children: 4   Years of education: Not on file   Highest education level: Not on file  Occupational History   Not on file  Tobacco Use   Smoking status: Never   Smokeless tobacco: Never  Vaping Use   Vaping status: Never Used  Substance and Sexual Activity   Alcohol use: Not Currently    Comment: Denied use as of 04/20/2023   Drug use: No   Sexual activity: Yes    Birth control/protection: Surgical, Post-menopausal    Comment: tubal  Other Topics Concern   Not on file  Social History Narrative   Lives with her husband and her children    Social Drivers of Health   Financial Resource Strain: Medium Risk (02/03/2022)   Overall Financial Resource Strain (CARDIA)    Difficulty of Paying Living Expenses: Somewhat hard  Food Insecurity: No Food Insecurity (08/25/2023)   Hunger Vital Sign    Worried About Running Out of Food in the Last Year: Never true    Ran Out of Food in the Last Year: Never true  Transportation Needs: No Transportation Needs (08/25/2023)   PRAPARE - Administrator, Civil Service (Medical): No    Lack of Transportation (Non-Medical): No  Physical Activity: Inactive  (02/03/2022)   Exercise Vital Sign    Days of Exercise per Week: 0 days    Minutes of Exercise per Session: 0 min  Stress: Stress Concern Present (02/03/2022)   [name]    Feeling of Stress : Very much  Social Connections: Moderately Isolated (02/03/2022)   Social Connection and Isolation Panel    Frequency of Communication with Friends and Family: More than three times a week    Frequency of Social Gatherings with Friends and Family: Twice a week    Attends Religious Services: Never    Database administrator or Organizations: No    Attends Engineer, structural: Never    Marital Status: Married   Family History  Problem Relation Age of Onset   Breast cancer Maternal Grandmother    Heart attack Father    Hypertension Father    COPD Mother    Diabetes Mellitus II Brother    Throat cancer Brother    Cerebral aneurysm Sister    Cervical cancer Sister    Cervical  cancer Sister    Heart attack Sister    Diabetes Daughter    Asthma Daughter    Cystic fibrosis Daughter    Hypertension Other    Outpatient Encounter Medications as of 02/09/2024  Medication Sig   albuterol  (VENTOLIN  HFA) 108 (90 Base) MCG/ACT inhaler Inhale 2 puffs into the lungs every 6 (six) hours as needed for wheezing or shortness of breath.   budesonide -formoterol  (BREYNA ) 80-4.5 MCG/ACT inhaler Inhale 2 puffs into the lungs 2 (two) times daily.   calcium  carbonate (OS-CAL - DOSED IN MG OF ELEMENTAL CALCIUM ) 1250 (500 Ca) MG tablet Take 1 tablet (1,250 mg total) by mouth 2 (two) times daily with a meal.   chlorthalidone  (HYGROTON ) 25 MG tablet Take 1 tablet (25 mg total) by mouth daily.   Cyanocobalamin (B-12 PO) Take 1 tablet by mouth daily.   gabapentin  (NEURONTIN ) 100 MG capsule Take 1 capsule (100 mg total) by mouth at bedtime.   Insulin  Pen Needle (BD PEN NEEDLE NANO U/F) 32G X 4 MM MISC 1 each by Does not apply route 4 (four) times  daily.   levothyroxine  (SYNTHROID ) 175 MCG tablet Take 1 tablet (175 mcg total) by mouth daily before breakfast.   metFORMIN  (GLUCOPHAGE ) 1000 MG tablet Take 1 tablet (1,000 mg total) by mouth 2 (two) times daily with a meal.   montelukast  (SINGULAIR ) 10 MG tablet Take 1 tablet (10 mg total) by mouth at bedtime.   oxyCODONE  (ROXICODONE ) 5 MG immediate release tablet Take 1 tablet (5 mg total) by mouth every 4 (four) hours as needed.   pantoprazole  (PROTONIX ) 40 MG tablet Take 1 tablet (40 mg total) by mouth daily.   predniSONE  (DELTASONE ) 20 MG tablet Take 1 tablet (20 mg total) by mouth daily with breakfast for 5 days.   promethazine -dextromethorphan (PROMETHAZINE -DM) 6.25-15 MG/5ML syrup Take 5 mLs by mouth 4 (four) times daily as needed.   sertraline  (ZOLOFT ) 100 MG tablet Take 1 tablet (100 mg total) by mouth daily.   valACYclovir (VALTREX) 500 MG tablet Take 500 mg by mouth daily.   Vitamin D , Ergocalciferol , (DRISDOL ) 1.25 MG (50000 UNIT) CAPS capsule Take 1 capsule (50,000 Units total) by mouth every 7 (seven) days.   [DISCONTINUED] levothyroxine  (SYNTHROID ) 175 MCG tablet Take 1 tablet (175 mcg total) by mouth daily before breakfast.   No facility-administered encounter medications on file as of 02/09/2024.   ALLERGIES: Allergies  Allergen Reactions   Fenoprofen Calcium      VACCINATION STATUS: Immunization History  Administered Date(s) Administered   Tdap 05/23/1998, 11/16/2021   Zoster Recombinant(Shingrix ) 01/21/2022, 04/22/2022    HPI Norma Horton is 53 y.o. female who presents today with a medical history as above. she is being seen in follow-up after she was seen in consultation for multinodular goiter requested by Edman Meade PEDLAR, FNP. She has history of multinodular goiter dating back to 2017.   After fine-needle aspiration biopsy of thyroid  nodule showed malignant results, she was sent for surgical treatment.  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  However, tumor was present at inked edge. She is status post total thyroidectomy for thyroid  malignancy on August 25, 2023. See notes from previous visit.    She was given 68.9 mCi of I-131 on December 08, 2023 with posttherapy whole-body scan on December 18, 2023 which showed intense uptake within the thyroid  bed, findings most consistent with thyroid  remnant within the thyroid  bed, could not completely exclude local adenopathy.  No evidence of distant metastasis.  Her stimulated thyroglobulin is not suppressed at 7.4.   Because of these findings, she was offered thyroid /neck ultrasound before this visit which showed postsurgical changes after total thyroidectomy.  Approximately 2 cm hypoechoic solid mass in the right hemithyroidectomy surgical bed concerning for residual/recurrent thyroid  malignancy.  No cervical lymphadenopathy. She presents with improvement in her previously noted dysphagia, occasional shortness of breath, and dysphonia. She is currently on levothyroxine  175 mcg p.o. daily before breakfast.   Her previsit thyroid  function tests are consistent with appropriate suppressive therapy.      She has no family history of thyroid  dysfunction or thyroid  malignancy.  Her other medical problems include type 2 diabetes on metformin  1000 mg p.o. twice daily.  Her point-of-care A1c today is 7.2%, stable compared to her previous presentations.  She has hypertension on treatment with hydrochlorothiazide .   Review of Systems  Constitutional:  No recent weight change, no fatigue, no subjective hyperthermia, no subjective hypothermia Eyes: no blurry vision, no xerophthalmia    Objective:       02/09/2024    8:16 AM 02/08/2024    9:19 AM 02/08/2024    9:18 AM  Vitals with BMI  Height 5' 3  5' 3  Weight 331 lbs 6 oz  330 lbs 1 oz  BMI 58.72  58.48  Systolic 116 142 849  Diastolic 72 80 88  Pulse 64  61    BP 116/72   Pulse 64   Ht 5' 3 (1.6 m)    Wt (!) 331 lb 6.4 oz (150.3 kg)   LMP 08/30/2016   BMI 58.70 kg/m   Wt Readings from Last 3 Encounters:  02/09/24 (!) 331 lb 6.4 oz (150.3 kg)  02/08/24 (!) 330 lb 0.6 oz (149.7 kg)  12/25/23 (!) 336 lb 9.6 oz (152.7 kg)    Physical Exam  Constitutional:  Body mass index is 58.7 kg/m.,  not in acute distress, normal state of mind Eyes: PERRLA, EOMI, no exophthalmos ENT: moist mucous membranes, + gross thyromegaly, no gross cervical lymphadenopathy   CMP ( most recent) CMP     Component Value Date/Time   NA 138 01/24/2024 0947   K 4.3 01/24/2024 0947   CL 96 01/24/2024 0947   CO2 22 01/24/2024 0947   GLUCOSE 148 (H) 01/24/2024 0947   GLUCOSE 145 (H) 08/27/2023 0441   BUN 16 01/24/2024 0947   CREATININE 0.74 01/24/2024 0947   CALCIUM  10.0 01/24/2024 0947   PROT 7.6 01/24/2024 0947   ALBUMIN 4.0 01/24/2024 0947   AST 62 (H) 01/24/2024 0947   ALT 54 (H) 01/24/2024 0947   ALKPHOS 100 01/24/2024 0947   BILITOT 0.8 01/24/2024 0947   GFRNONAA >60 08/27/2023 0441   GFRAA >60 11/06/2010 2348    Thyroid  ultrasound on November 19, 2015 FINDINGS: Right thyroid  lobe   Measurements: 6.8 x 3.8 x 4.5 cm. Right upper pole predominately solid nodule measures 4.9 x 3.7 x 4.5 cm   Left thyroid  lobe : Measurements: 4.9 x 1.5 x 1.4 cm. Heterogeneous tissue without focal  nodule.   Isthmus : Thickness: 0.3 cm.  No nodules visualized.   Lymphadenopathy: None visualized.   IMPRESSION: Solitary large right lobe nodule measures 4.9 cm. Findings meet consensus criteria for biopsy. Ultrasound-guided fine needle aspiration should be considered, as per the consensus statement: Management of Thyroid  Nodules Detected at US : Society of Radiologists in Ultrasound Consensus Conference Statement. Radiology 2005; [id number].  Final needle aspiration biopsy of thyroid  nodules in August 2017 COMMENT: THE SPECIMEN  IS HYPERCELLULAR AND CONSISTS OF SMALL AND MEDIUM SIZED GROUPS OF FOLLICULAR   EPITHELIAL CELLS WITH CYTOLOGIC ATYPIA, INCLUDING NUCLEAR ENLARGEMENT AND OVERLAP.    Patient did not want surgery.  Repeat thyroid  ultrasound was performed on January 21, 2022 showing   Isthmus: 0.3 cm ,previously 0.3 cm   Right lobe: 7.7 x 3.9 x 4.7 cm ,previously 6.8 x 3.8 x 4.5 cm,    Left lobe: 5.2 x 1.1 x 1.5 cm ,previously 4.9 x 1.5 x 1.4 cm  Nodule in the right lobe of her thyroid  size: 6.4 cm; Other 2 dimensions: 4.8 x 3.8 cm, previously,  4.9 x 3.7 x 4.5 cm  FNA 03/20/23 Clinical History: 6.4 cm RML; TI-RADS risk category: TR4 (4-6 pts.)  Specimen Submitted:  A. THYROID , RML, FINE NEEDLE ASPIRATION:    FINAL MICROSCOPIC DIAGNOSIS:  - Atypia of undetermined significance (Bethesda category III)   Afirma: GSC Classifier Candia Classifier - Suspicious ( ~ 50% risk of malignancy).   Thyroidectomy on August 25, 2023 FINAL MICROSCOPIC DIAGNOSIS:   A. THYROID , LEFT, LOBECTOMY:  - Thyroid  follicular hyperplasia/adenomatous nodule  - No malignancy identified   B. THYROID , RIGHT, LOBECTOMY:  - Papillary thyroid  carcinoma, classic type, 0.9 cm  - Tumor is present at inked edge  - No lymphovascular invasion identified  - Thyroid  follicular nodular disease/adenomatous nodules  - See oncology table   Thyroid /neck ultrasound on January 05, 2024 FINDINGS: Postsurgical changes after total thyroidectomy. Within the right hemi thyroid  surgical bed is a lobular, hypoechoic solid mass measuring approximately 2.0 x 1.6 x 1.0 cm. No evidence of cervical lymphadenopathy.   IMPRESSION: 1. Postsurgical changes after total thyroidectomy. 2. Approximately 2 cm hypoechoic solid mass in the right hemithyroidectomy surgical bed, concerning for residual/recurrent thyroid  malignancy. 3. No cervical lymphadenopathy.    Diabetic Labs (most recent): Lab Results  Component Value Date   HGBA1C 7.2 (A) 11/13/2023   HGBA1C 7.0 (H) 08/23/2023   HGBA1C 7.1 (H) 03/03/2023     Lab  Results  Component Value Date   TSH 0.337 (L) 01/24/2024   TSH 119.250 (H) 12/08/2023   TSH 10.000 (H) 11/02/2023   TSH 2.494 08/23/2023   TSH 3.280 03/03/2023   TSH 3.390 03/03/2023   TSH 2.920 10/31/2022   TSH 3.640 08/10/2022   TSH 2.300 07/25/2022   TSH 2.750 04/22/2022   FREET4 1.75 01/24/2024   FREET4 1.07 12/08/2023   FREET4 1.33 11/02/2023   FREET4 1.17 03/03/2023   FREET4 1.16 03/03/2023   FREET4 1.15 10/31/2022   FREET4 1.22 08/10/2022   FREET4 1.28 07/25/2022   FREET4 1.21 04/22/2022   FREET4 1.15 01/21/2022    She is status post Thyrogen  stimulated remnant ablation utilizing 66.9 mCi of I-131 FINDINGS: Intense uptake within the thyroid  bed. No evidence abnormal radiotracer outside of the thyroid  bed.   IMPRESSION: 1. Findings most consistent with thyroid  remnant within the thyroid  bed. Cannot completely exclude local adenopathy. Recommend following thyroglobulin levels. 2. No evidence of metastatic disease.  Assessment & Plan:   1.  Thyroid  malignancy   2.  Postsurgical hypothyroidism  3.  Morbid obesity   4  Hypertension   5.  Type 2 diabetes  6. Vitamin D  deficiency   - Based on these reviews, she has history of nodular goiter, growing overall, and growing nodule on the right lobe.  This right lobe nodule was previously biopsied with bethesda 3 findings in 2017.  After she was approached for a repeat biopsy of a 6.4 cm nodule  in July 2024, she opted to avoid it.  She was re-approached due to the fact that she reported occasional compressive symptoms. Hide biopsy results on March 20, 2023 was consistent with atypia of undetermined significance.  A sample was sent for molecular testing with Afirma gene sequencing classifier which showed approximately 50% risk of malignancy. 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  I had a long discussion with her about treatment course and  expectations for differentiated thyroid  cancer. She was given 68.9 mCi of I-131 for thyroid  remnant ablation followed by whole-body scan. Posttherapy whole-body scan revealed possible thyroid  remnant, could not rule out local adenopathy, no evidence of distant metastasis.  Her stimulated thyroglobulin was 7.4 prior to her last visit. Her previsit thyroid /neck ultrasound reveals 2 cm hypoechoic solid mass in the right thyroid  bed.  - She will be sent for fine-needle aspiration biopsy of this lesion with possible thyroglobulin washout.  If she has evidence of residual/structural disease, she will be considered for surgical referral for reoperation. She will also be sent for repeat thyroglobulin and thyroglobulin antibodies to be measured simultaneously.  This puts her in the category of structural incomplete treatment response with high risk for recurrence.  - For her postsurgical hypothyroidism, she is advised to continue levothyroxine  175 mcg p.o. daily before breakfast.  - We discussed about the correct intake of her thyroid  hormone, on empty stomach at fasting, with water , separated by at least 30 minutes from breakfast and other medications,  and separated by more than 4 hours from calcium , iron, multivitamins, acid reflux medications (PPIs). -Patient is made aware of the fact that thyroid  hormone replacement is needed for life, dose to be adjusted by periodic monitoring of thyroid  function tests.   -For her diabetes type 2 complicated by morbid obesity, she will benefit from GLP-1 receptor agonists which she will be considered for after completion of her thyroid  cancer treatment.  Her BMI is 59.63. In the meantime, she is advised to continue metformin  1000 mg p.o. twice daily.  Her recent A1c was 7.2%.     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  She will benefit from evaluation by GI, will be referred. - Her blood pressure  was controlled to target.  She is advised to continue chlorthalidone  25 mg p.o. daily at breakfast.  She is also encouraged to continue on her vitamin D2 50,000 units weekly.   - she is advised to maintain close follow up with Bacchus, Meade PEDLAR, FNP for primary care needs.   I spent  30  minutes in the care of the patient today including review of labs from Thyroid  Function, CMP, and other relevant labs ; imaging/biopsy records (current and previous including abstractions from other facilities); face-to-face time discussing  her lab results and symptoms, medications doses, her options of short and long term treatment based on the latest standards of care / guidelines;   and documenting the encounter.  Norma Horton  participated in the discussions, expressed understanding, and voiced agreement with the above plans.  All questions were answered to her satisfaction. she is encouraged to contact clinic should she have any questions or concerns prior to her return visit.   Follow up plan: Return in about 4 weeks (around 03/08/2024) for F/U with Pre-visit Labs, F/U with Biopsy Results, A1c -NV.   Ranny Earl, MD [facility]  [address] [place], [place] [address] Phone: [phone]  Fax: [phone]     02/09/2024, 10:03 AM  This note was partially dictated with voice recognition software. Similar sounding words can be transcribed inadequately or may not  be corrected upon review.

## 2024-02-13 ENCOUNTER — Encounter: Payer: Self-pay | Admitting: Internal Medicine

## 2024-02-13 NOTE — Progress Notes (Unsigned)
 Norma Horton POUR, MD  Norma Horton Approved for US  guided FNA of possible thyroid  cancer recurrence in the RIGHT thyroid  resection bed.  Please schedule at [facility] if possible, not at the hospital.  [facility]       Previous Messages    ----- Message ----- From: Norma Horton Sent: 02/09/2024  10:44 AM EDT To: Channing Horton Norma; Ir Procedure Requests Subject: US  FNA BX THYROID  1ST LESION AFIRMA            Procedure :US  FNA BX THYROID  1ST LESION AFIRMA  Reason :2cm hypoechoic solid mass in the thyrodectomy bed, consider thyroglobulin washout Dx: Malignant neoplasm of thyroid  gland (HCC) Ini.Heymann (ICD-10-CM)]    History :US  SOFT TISSUE HEAD & NECK (NON-THYROID ),NM Whole Body I131 Scan S/P Ca Rx,NM RAI THERAPY W/ THYROGEN  THYROID  [place]  Provider: Lenis Ethelle ORN, MD  Provider contact ; [phone]

## 2024-02-14 ENCOUNTER — Telehealth: Payer: Self-pay

## 2024-02-14 NOTE — Telephone Encounter (Signed)
 Copied from CRM #[id number]. Topic: General - Other >> Feb 14, 2024 10:36 AM Joesph NOVAK wrote: Reason for CRM: patient would like a update on her FMLA paperwork.

## 2024-02-15 ENCOUNTER — Telehealth: Payer: Self-pay | Admitting: "Endocrinology

## 2024-02-15 NOTE — Telephone Encounter (Signed)
 Sent patient's radiology and report disc by [facility] to [facility] to be mailed to [facility] on [address].

## 2024-02-20 NOTE — Telephone Encounter (Signed)
 In Norma Horton's signature box to complete- she has taken some forms home with her to bring back Thursday

## 2024-02-22 ENCOUNTER — Telehealth: Payer: Self-pay | Admitting: *Deleted

## 2024-02-22 ENCOUNTER — Ambulatory Visit (INDEPENDENT_AMBULATORY_CARE_PROVIDER_SITE_OTHER): Admitting: Internal Medicine

## 2024-02-22 VITALS — BP 123/84 | HR 70 | Temp 98.3°F | Ht 63.0 in | Wt 332.2 lb

## 2024-02-22 DIAGNOSIS — K219 Gastro-esophageal reflux disease without esophagitis: Secondary | ICD-10-CM | POA: Diagnosis not present

## 2024-02-22 DIAGNOSIS — R7989 Other specified abnormal findings of blood chemistry: Secondary | ICD-10-CM

## 2024-02-22 DIAGNOSIS — Z1211 Encounter for screening for malignant neoplasm of colon: Secondary | ICD-10-CM

## 2024-02-22 DIAGNOSIS — K7581 Nonalcoholic steatohepatitis (NASH): Secondary | ICD-10-CM

## 2024-02-22 DIAGNOSIS — R1085 Abdominal pain of multiple sites: Secondary | ICD-10-CM

## 2024-02-22 NOTE — Patient Instructions (Signed)
 Going to order blood work today to further evaluate your abnormal liver test.  I am also going to order a CT scan of your abdomen pelvis.  If this is not approved by insurance then we will proceed with ultrasound instead.  Recommend 1-2# weight loss per week until ideal body weight through exercise & diet. Low fat/cholesterol diet.   Avoid sweets, sodas, fruit juices, sweetened beverages like tea, etc. Gradually increase exercise from 15 min daily up to 1 hr per day 5 days/week.  I have also ordered Cologuard testing for colon cancer screening.  Follow-up in 8 weeks.  It was very nice meeting you today.  Dr. Cindie

## 2024-02-22 NOTE — Telephone Encounter (Signed)
 RADmd PA for CT: In review. Tracking: [phone]

## 2024-02-22 NOTE — Progress Notes (Signed)
 Primary Care Physician:  Edman Meade PEDLAR, FNP Primary Gastroenterologist:  Dr. Cindie  Chief Complaint  Patient presents with   New Patient (Initial Visit)    Pt referred for MASLD    HPI:   Norma Horton is a 53 y.o. female who presents to the clinic today by referral from her endocrinologist Dr. Lenis for evaluation.  Elevated LFTs, MASH: Patient with blood work from 01/24/2024 which showed AST 62, ALT 54, alk phos 100, T. bili 0.8, albumin 4.0.  CBC from 08/27/2023 showed platelet count of 254.  FIB4 1.65 (indeterminate risk for advanced liver fibrosis), ELF 9.93 (Mid risk)  No imaging of her liver in her chart that I can find.  Has risk factors for MASH including morbid obesity (BMI 58.85), diabetes, hypertension, dyslipidemia.  Denies any alcohol use.  No illicit drug use.  No exposure or viral hepatitis that she is aware of.  No herbal supplements.  No family history of liver disease.  Chronic GERD: Well-controlled on pantoprazole  40 mg daily.  Denies any dysphagia odynophagia.  No epigastric or chest pain.  Colon cancer screening: No prior colonoscopy.  No prior testing of any kind including Cologuard.  No family history colorectal malignancy.  No melena hematochezia.  No abdominal pain or unintentional weight loss.   Past Medical History:  Diagnosis Date   Anxiety    Asthma    Depression    Pt has had suicide ideations in the past.   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 6.7 each 1   budesonide -formoterol  (BREYNA ) 80-4.5 MCG/ACT inhaler Inhale 2 puffs into the lungs 2 (two) times daily. 10.2 g 3   calcium  carbonate (OS-CAL - DOSED IN MG OF ELEMENTAL CALCIUM ) 1250 (500 Ca) MG tablet Take 1 tablet (1,250 mg total) by mouth 2 (two) times daily with a meal. 60 tablet 1   chlorthalidone  (HYGROTON ) 25 MG tablet Take 1 tablet (25 mg total) by mouth daily. 90 tablet 0   Cyanocobalamin (B-12 PO) Take 1 tablet by mouth daily.     gabapentin  (NEURONTIN ) 100 MG capsule Take 1 capsule (100 mg total) by mouth at bedtime. 30 capsule 2   Insulin  Pen Needle (BD PEN NEEDLE NANO U/F) 32G X 4 MM MISC 1 each by Does not apply route 4 (four) times daily. 100 each 1   levothyroxine  (SYNTHROID ) 175 MCG tablet Take 1 tablet (175 mcg total) by mouth daily before breakfast. 90 tablet 1   metFORMIN  (GLUCOPHAGE ) 1000 MG tablet Take 1 tablet (1,000 mg total) by mouth 2 (two) times daily with a meal. 180 tablet 3   montelukast  (SINGULAIR ) 10 MG tablet Take 1 tablet (10 mg total) by mouth at bedtime. 30 tablet 0   oxyCODONE  (ROXICODONE ) 5 MG immediate release tablet Take 1 tablet (5 mg total) by mouth every 4 (four) hours as needed.  20 tablet 0   pantoprazole  (PROTONIX ) 40 MG tablet Take 1 tablet (40 mg total) by mouth daily. 60 tablet 1   promethazine -dextromethorphan (PROMETHAZINE -DM) 6.25-15 MG/5ML syrup Take 5 mLs by mouth 4 (four) times daily as needed. 118 mL 0   sertraline  (ZOLOFT ) 100 MG tablet Take 1 tablet (100 mg total) by mouth daily. 30 tablet 5   valACYclovir (VALTREX) 500 MG tablet Take 500 mg by mouth daily.     Vitamin D , Ergocalciferol , (DRISDOL ) 1.25 MG (50000 UNIT) CAPS capsule Take 1 capsule (50,000 Units total) by mouth every 7 (seven) days. 20 capsule 1   No current facility-administered medications for this visit.    Allergies as of 02/22/2024 - Review Complete 02/22/2024  Allergen Reaction Noted   Fenoprofen  calcium   11/06/2010    Family History  Problem Relation Age of Onset   Breast cancer Maternal Grandmother    Heart attack Father    Hypertension Father    COPD Mother    Diabetes Mellitus II Brother    Throat cancer Brother    Cerebral aneurysm Sister    Cervical cancer Sister    Cervical cancer Sister    Heart attack Sister    Diabetes Daughter    Asthma Daughter    Cystic fibrosis Daughter    Hypertension Other     Social History   Socioeconomic History   Marital status: Divorced    Spouse name: Not on file   Number of children: 4   Years of education: Not on file   Highest education level: Not on file  Occupational History   Not on file  Tobacco Use   Smoking status: Never   Smokeless tobacco: Never  Vaping Use   Vaping status: Never Used  Substance and Sexual Activity   Alcohol use: Not Currently    Comment: Denied use as of 04/20/2023   Drug use: No   Sexual activity: Yes    Birth control/protection: Surgical, Post-menopausal    Comment: tubal  Other Topics Concern   Not on file  Social History Narrative   Lives with her husband and her children    Social Drivers of Health   Financial Resource Strain: Medium Risk (02/03/2022)   Overall Financial Resource Strain (CARDIA)    Difficulty of Paying Living Expenses: Somewhat hard  Food Insecurity: No Food Insecurity (08/25/2023)   Hunger Vital Sign    Worried About Running Out of Food in the Last Year: Never true    Ran Out of Food in the Last Year: Never true  Transportation Needs: No Transportation Needs (08/25/2023)   PRAPARE - Administrator, Civil Service (Medical): No    Lack of Transportation (Non-Medical): No  Physical Activity: Inactive (02/03/2022)   Exercise Vital Sign    Days of Exercise per Week: 0 days    Minutes of Exercise per Session: 0 min  Stress: Stress Concern Present (02/03/2022)   [name]    Feeling of  Stress : Very much  Social Connections: Moderately Isolated (02/03/2022)   Social Connection and Isolation Panel    Frequency of Communication with Friends and Family: More than three times a week    Frequency of Social Gatherings with Friends and Family: Twice a week    Attends Religious Services: Never    Database administrator or Organizations: No    Attends Banker Meetings: Never    Marital Status: Married  Catering manager  Violence: Not At Risk (08/25/2023)   Humiliation, Afraid, Rape, and Kick questionnaire    Fear of Current or Ex-Partner: No    Emotionally Abused: No    Physically Abused: No    Sexually Abused: No    Subjective: Review of Systems  Constitutional:  Negative for chills and fever.  HENT:  Negative for congestion and hearing loss.   Eyes:  Negative for blurred vision and double vision.  Respiratory:  Negative for cough and shortness of breath.   Cardiovascular:  Negative for chest pain and palpitations.  Gastrointestinal:  Negative for abdominal pain, blood in stool, constipation, diarrhea, heartburn, melena and vomiting.  Genitourinary:  Negative for dysuria and urgency.  Musculoskeletal:  Negative for joint pain and myalgias.  Skin:  Negative for itching and rash.  Neurological:  Negative for dizziness and headaches.  Psychiatric/Behavioral:  Negative for depression. The patient is not nervous/anxious.        Objective: BP 123/84   Pulse 70   Temp 98.3 F (36.8 C)   Ht 5' 3 (1.6 m)   Wt (!) 332 lb 3.2 oz (150.7 kg)   LMP 08/30/2016   BMI 58.85 kg/m  Physical Exam Constitutional:      Appearance: Normal appearance. She is obese.  HENT:     Head: Normocephalic and atraumatic.  Eyes:     Extraocular Movements: Extraocular movements intact.     Conjunctiva/sclera: Conjunctivae normal.  Cardiovascular:     Rate and Rhythm: Normal rate and regular rhythm.  Pulmonary:     Effort: Pulmonary effort is normal.     Breath sounds: Normal  breath sounds.  Abdominal:     General: Bowel sounds are normal.     Palpations: Abdomen is soft.  Musculoskeletal:        General: No swelling. Normal range of motion.     Cervical back: Normal range of motion and neck supple.  Skin:    General: Skin is warm and dry.     Coloration: Skin is not jaundiced.  Neurological:     General: No focal deficit present.     Mental Status: She is alert and oriented to person, place, and time.  Psychiatric:        Mood and Affect: Mood normal.        Behavior: Behavior normal.      Assessment/Plan:  1.  Elevated LFTs, MASH-discussed in depth with patient today.  Risk factors include morbid obesity, diabetes, hypertension, dyslipidemia.  FIB4 1.65 (indeterminate risk for advanced liver fibrosis), ELF 9.93 (Mid risk)  Will perform further serological workup today.  Needs imaging of her liver.  Given her morbid obesity, ultrasound likely to be a poor study.  Will order CT abdomen pelvis with IV contrast to further evaluate.  Recommend 1-2# weight loss per week until ideal body weight through exercise & diet. Low fat/cholesterol diet.   Avoid sweets, sodas, fruit juices, sweetened beverages like tea, etc. Gradually increase exercise from 15 min daily up to 1 hr per day 5 days/week.  Patient would be a great candidate for GLP-1.  Plan is to start her on 1 of these medications once her thyroid  cancer has been adequately controlled.  2.  Chronic GERD-well-controlled pantoprazole  daily.  Will continue.  3.  Colon cancer screening-discussed options with patient today.  She would like to proceed with Cologuard testing which I will order.  Follow-up in 8 weeks.  Thank you Dr. Lenis for the kind referral    02/22/2024 11:25  AM

## 2024-02-23 NOTE — Telephone Encounter (Signed)
 Pt informed that CT is scheduled for Wednesday 03/13/24, arrive at 9:15 am to check in at [facility] radiology.

## 2024-02-23 NOTE — Telephone Encounter (Signed)
 RADmd PA for CT: Request PI:[id number] Tracking: [id number] Request Date: 02/22/2024 09:52 AM  Status:Approved Entry Method: RadMD Validity Dates: 02/22/2024-03/23/2024

## 2024-02-29 ENCOUNTER — Telehealth: Payer: Self-pay | Admitting: "Endocrinology

## 2024-02-29 NOTE — Telephone Encounter (Signed)
 Pt states where she was referred for a test does not take her insurance and needs to be referred somewhere else.

## 2024-03-05 ENCOUNTER — Telehealth: Payer: Self-pay | Admitting: Family Medicine

## 2024-03-05 NOTE — Telephone Encounter (Signed)
 Copied from CRM #[id number]. Topic: General - Other >> Feb 14, 2024 10:36 AM Joesph NOVAK wrote: Reason for CRM: patient would like a update on her FMLA paperwork. >> Mar 05, 2024 10:03 AM Montie POUR wrote: Shali is calling today to check on her FMLA paperwork to make sure it was completed and faxed back. Please make sure the number 9 question is completed. She checked and they have not received it back. Please call Ronette at [phone] today if possible. Thanks

## 2024-03-05 NOTE — Telephone Encounter (Signed)
 Copied from CRM #[id number]. Topic: General - Other >> Feb 14, 2024 10:36 AM Joesph NOVAK wrote: Reason for CRM: patient would like a update on her FMLA paperwork. >> Mar 05, 2024 10:03 AM Montie POUR wrote: Norma Horton is calling today to check on her FMLA paperwork to make sure it was completed and faxed back. Please make sure the number 9 question is completed. She checked and they have not received it back. Please call Norma Horton at [phone] today if possible. Thanks

## 2024-03-06 ENCOUNTER — Other Ambulatory Visit: Payer: Self-pay | Admitting: "Endocrinology

## 2024-03-06 NOTE — Telephone Encounter (Signed)
 Sent a referral message stating pt has had her previous thyroid  ultrasound at [facility] and would like her biopsy scheduled there also.

## 2024-03-06 NOTE — Telephone Encounter (Signed)
 Awaiting Norma Horton return to see where paperwork is , Norma Horton also made aware

## 2024-03-06 NOTE — Telephone Encounter (Signed)
 It looks like order for biopsy was sent to [facility] at [facility] but pt states she has had her other test at [facility].

## 2024-03-07 NOTE — Telephone Encounter (Signed)
 I think they can use the same order. I hollace Ducking our referral coordinator about the change. I'll let you know if she states we need a new order

## 2024-03-08 NOTE — Telephone Encounter (Signed)
 Called patient left voicemail forms have been faxed, copy at front desk for pick up.

## 2024-03-11 ENCOUNTER — Ambulatory Visit: Admitting: "Endocrinology

## 2024-03-13 ENCOUNTER — Ambulatory Visit (HOSPITAL_COMMUNITY)
Admission: RE | Admit: 2024-03-13 | Discharge: 2024-03-13 | Disposition: A | Discharge status: Home / Self Care | Source: Ambulatory Visit | Attending: Internal Medicine | Admitting: Internal Medicine

## 2024-03-13 DIAGNOSIS — R1085 Abdominal pain of multiple sites: Secondary | ICD-10-CM | POA: Insufficient documentation

## 2024-03-13 LAB — POCT I-STAT CREATININE: Creatinine, Ser: 0.8 mg/dL (ref 0.44–1.00)

## 2024-03-13 MED ORDER — IOHEXOL 300 MG/ML  SOLN
100.0000 mL | Freq: Once | INTRAMUSCULAR | Status: AC | PRN
  Administered 2024-03-13: 100 mL via INTRAVENOUS

## 2024-03-22 ENCOUNTER — Ambulatory Visit (HOSPITAL_COMMUNITY)
Admission: RE | Admit: 2024-03-22 | Discharge: 2024-03-22 | Disposition: A | Discharge status: Home / Self Care | Source: Ambulatory Visit | Attending: "Endocrinology | Admitting: "Endocrinology

## 2024-03-22 DIAGNOSIS — Z85850 Personal history of malignant neoplasm of thyroid: Secondary | ICD-10-CM | POA: Insufficient documentation

## 2024-03-22 DIAGNOSIS — E041 Nontoxic single thyroid nodule: Secondary | ICD-10-CM | POA: Insufficient documentation

## 2024-03-22 MED ORDER — LIDOCAINE HCL (PF) 2 % IJ SOLN
10.0000 mL | Freq: Once | INTRAMUSCULAR | Status: AC
  Administered 2024-03-22: 10 mL via INTRADERMAL

## 2024-03-22 MED ORDER — LIDOCAINE HCL (PF) 2 % IJ SOLN
INTRAMUSCULAR | Status: AC
Start: 2024-03-22 — End: 2024-03-22
  Filled 2024-03-22: qty 10

## 2024-03-22 NOTE — Progress Notes (Incomplete)
 Pt. Brought to US  RM 1 in no obvious distress. Discussed thyroid  biopsy, consent obtained. US  imaging obtained. Prepped and draped with sterile technique. Local anesthetic admin without adverse event. Access obtained under US  guidance. Samples obtained, collected. Patient tolerated well. Access removed, bandage placed. Assisted to sit up on side of stretcher. No adverse reaction noted. DC instructions provided. Taken to radiology waiting area for DC.

## 2024-03-24 ENCOUNTER — Other Ambulatory Visit: Payer: Self-pay | Admitting: Family Medicine

## 2024-03-24 DIAGNOSIS — J4541 Moderate persistent asthma with (acute) exacerbation: Secondary | ICD-10-CM

## 2024-03-26 LAB — CELIAC AB TTG DGP TIGA
Deamidated Gliadin Abs, IgA: 4 U (ref 0–19)
Deamidated Gliadin Abs, IgG: 1 U (ref 0–19)
Immunoglobulin A, (IgA) QN, Serum: 581 mg/dL — ABNORMAL HIGH (ref 87–352)
t-Transglutaminase (tTG) IgA: <2 U/mL (ref 0–3)
t-Transglutaminase (tTG) IgG: 4 U/mL (ref 0–5)

## 2024-03-26 LAB — IRON,TIBC AND FERRITIN PANEL
Ferritin: 159 ng/mL — ABNORMAL HIGH (ref 15–150)
Iron Saturation: 13 % — ABNORMAL LOW (ref 15–55)
Iron: 47 ug/dL (ref 27–159)
Total Iron Binding Capacity: 349 ug/dL (ref 250–450)
UIBC: 302 ug/dL (ref 131–425)

## 2024-03-26 LAB — IMMUNOGLOBULINS A/E/G/M, SERUM
IgE (Immunoglobulin E), Serum: 328 [IU]/mL (ref 6–495)
IgG (Immunoglobin G), Serum: 1128 mg/dL (ref 586–1602)
IgM (Immunoglobulin M), Srm: 72 mg/dL (ref 26–217)

## 2024-03-26 LAB — HEPATITIS C ANTIBODY: Hep C Virus Ab: REACTIVE — AB

## 2024-03-26 LAB — ANA

## 2024-03-26 LAB — HEPATITIS B CORE ANTIBODY, TOTAL: Hep B Core Total Ab: NEGATIVE

## 2024-03-26 LAB — HEPATITIS A ANTIBODY, TOTAL: hep A Total Ab: NEGATIVE

## 2024-03-26 LAB — ANTI-SMOOTH MUSCLE ANTIBODY, IGG: Smooth Muscle Ab: 4 U (ref 0–19)

## 2024-03-26 LAB — HEPATITIS B SURFACE ANTIBODY,QUALITATIVE: Hep B Surface Ab, Qual: NONREACTIVE

## 2024-03-26 LAB — HEPATITIS B SURFACE ANTIGEN: Hepatitis B Surface Ag: NEGATIVE

## 2024-03-26 LAB — CYTOLOGY - NON PAP

## 2024-03-29 ENCOUNTER — Other Ambulatory Visit: Payer: Self-pay

## 2024-03-29 DIAGNOSIS — K219 Gastro-esophageal reflux disease without esophagitis: Secondary | ICD-10-CM

## 2024-04-02 LAB — T4, FREE: Free T4: 2.03 ng/dL — ABNORMAL HIGH (ref 0.82–1.77)

## 2024-04-02 LAB — TSH: TSH: 0.187 u[IU]/mL — ABNORMAL LOW (ref 0.450–4.500)

## 2024-04-02 LAB — THYROGLOBULIN ANTIBODY: Thyroglobulin Antibody: <1 [IU]/mL (ref 0.0–0.9)

## 2024-04-02 LAB — THYROGLOBULIN LEVEL: Thyroglobulin (TG-RIA): <2 ng/mL

## 2024-04-03 ENCOUNTER — Encounter: Payer: Self-pay | Admitting: "Endocrinology

## 2024-04-03 ENCOUNTER — Other Ambulatory Visit: Payer: Self-pay

## 2024-04-03 ENCOUNTER — Ambulatory Visit: Admitting: "Endocrinology

## 2024-04-03 VITALS — BP 100/68 | HR 76 | Ht 63.0 in | Wt 331.8 lb

## 2024-04-03 DIAGNOSIS — C73 Malignant neoplasm of thyroid gland: Secondary | ICD-10-CM

## 2024-04-03 DIAGNOSIS — Z6841 Body Mass Index (BMI) 40.0 and over, adult: Secondary | ICD-10-CM

## 2024-04-03 DIAGNOSIS — K760 Fatty (change of) liver, not elsewhere classified: Secondary | ICD-10-CM

## 2024-04-03 DIAGNOSIS — E119 Type 2 diabetes mellitus without complications: Secondary | ICD-10-CM | POA: Diagnosis not present

## 2024-04-03 DIAGNOSIS — R600 Localized edema: Secondary | ICD-10-CM

## 2024-04-03 DIAGNOSIS — E890 Postprocedural hypothyroidism: Secondary | ICD-10-CM

## 2024-04-03 DIAGNOSIS — I10 Essential (primary) hypertension: Secondary | ICD-10-CM | POA: Diagnosis not present

## 2024-04-03 DIAGNOSIS — Z7984 Long term (current) use of oral hypoglycemic drugs: Secondary | ICD-10-CM

## 2024-04-03 MED ORDER — LEVOTHYROXINE SODIUM 175 MCG PO TABS: 175.0000 ug | ORAL_TABLET | Freq: Every day | ORAL | 1 refills | Status: DC

## 2024-04-03 NOTE — Progress Notes (Signed)
 04/03/2024    Endocrinology follow-up note  Subjective:    Patient ID: Norma Horton, female    DOB: 08/21/1970, PCP Bacchus, Meade PEDLAR, FNP   Past Medical History:  Diagnosis Date   Anxiety    Asthma    Depression    Pt has had suicide ideations in the past.   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   Past Surgical History:  Procedure Laterality Date   BIOPSY THYROID   03/2023   CHOLECYSTECTOMY     THYROIDECTOMY N/A 08/25/2023   Procedure: TOTAL THYROIDECTOMY;  Surgeon: Mavis Anes, MD;  Location: [facility];  Service: General;  Laterality: N/A;  TOTAL   TUBAL LIGATION     Social History   Socioeconomic History   Marital status: Divorced    Spouse name: Not on file   Number of children: 4   Years of education: Not on file   Highest education level: Not on file  Occupational History   Not on file  Tobacco Use   Smoking status: Never   Smokeless tobacco: Never  Vaping Use   Vaping status: Never Used  Substance and Sexual Activity   Alcohol use: Not Currently    Comment: Denied use as of 04/20/2023   Drug use: No   Sexual activity: Yes    Birth control/protection: Surgical, Post-menopausal    Comment: tubal  Other Topics Concern   Not on file  Social History Narrative   Lives with her husband and her children    Social Drivers of Health   Financial Resource Strain: Medium Risk (02/03/2022)   Overall Financial Resource Strain (CARDIA)    Difficulty of Paying Living Expenses: Somewhat hard  Food Insecurity: No Food Insecurity (08/25/2023)   Hunger Vital Sign    Worried About Running Out of Food in the Last Year: Never true    Ran Out of Food in the Last Year: Never true  Transportation Needs: No Transportation Needs (08/25/2023)   PRAPARE - Administrator, Civil Service (Medical): No    Lack of Transportation (Non-Medical): No  Physical Activity: Inactive  (02/03/2022)   Exercise Vital Sign    Days of Exercise per Week: 0 days    Minutes of Exercise per Session: 0 min  Stress: Stress Concern Present (02/03/2022)   [name]    Feeling of Stress : Very much  Social Connections: Moderately Isolated (02/03/2022)   Social Connection and Isolation Panel    Frequency of Communication with Friends and Family: More than three times a week    Frequency of Social Gatherings with Friends and Family: Twice a week    Attends Religious Services: Never    Database Administrator or Organizations: No    Attends Engineer, Structural: Never    Marital Status: Married   Family History  Problem Relation Age of Onset   Breast cancer Maternal Grandmother    Heart attack Father    Hypertension Father    COPD Mother    Diabetes Mellitus II Brother    Throat cancer Brother    Cerebral aneurysm Sister    Cervical cancer Sister    Cervical  cancer Sister    Heart attack Sister    Diabetes Daughter    Asthma Daughter    Cystic fibrosis Daughter    Hypertension Other    Outpatient Encounter Medications as of 04/03/2024  Medication Sig   albuterol  (VENTOLIN  HFA) 108 (90 Base) MCG/ACT inhaler INHALE 2 PUFFS BY MOUTH EVERY 6 HOURS AS NEEDED FOR WHEEZING AND FOR SHORTNESS OF BREATH   budesonide -formoterol  (BREYNA ) 80-4.5 MCG/ACT inhaler Inhale 2 puffs into the lungs 2 (two) times daily.   calcium  carbonate (OS-CAL - DOSED IN MG OF ELEMENTAL CALCIUM ) 1250 (500 Ca) MG tablet Take 1 tablet (1,250 mg total) by mouth 2 (two) times daily with a meal.   chlorthalidone  (HYGROTON ) 25 MG tablet Take 1 tablet by mouth once daily   Cyanocobalamin (B-12 PO) Take 1 tablet by mouth daily.   gabapentin  (NEURONTIN ) 100 MG capsule Take 1 capsule (100 mg total) by mouth at bedtime.   Insulin  Pen Needle (BD PEN NEEDLE NANO U/F) 32G X 4 MM MISC 1 each by Does not apply route 4 (four) times daily.    levothyroxine  (SYNTHROID ) 175 MCG tablet Take 1 tablet (175 mcg total) by mouth daily before breakfast.   metFORMIN  (GLUCOPHAGE ) 1000 MG tablet Take 1 tablet (1,000 mg total) by mouth 2 (two) times daily with a meal.   montelukast  (SINGULAIR ) 10 MG tablet Take 1 tablet (10 mg total) by mouth at bedtime.   oxyCODONE  (ROXICODONE ) 5 MG immediate release tablet Take 1 tablet (5 mg total) by mouth every 4 (four) hours as needed.   pantoprazole  (PROTONIX ) 40 MG tablet Take 1 tablet by mouth once daily   promethazine -dextromethorphan (PROMETHAZINE -DM) 6.25-15 MG/5ML syrup Take 5 mLs by mouth 4 (four) times daily as needed.   sertraline  (ZOLOFT ) 100 MG tablet Take 1 tablet (100 mg total) by mouth daily.   valACYclovir (VALTREX) 500 MG tablet Take 500 mg by mouth daily.   Vitamin D , Ergocalciferol , (DRISDOL ) 1.25 MG (50000 UNIT) CAPS capsule Take 1 capsule (50,000 Units total) by mouth every 7 (seven) days.   [DISCONTINUED] levothyroxine  (SYNTHROID ) 175 MCG tablet Take 1 tablet (175 mcg total) by mouth daily before breakfast.   No facility-administered encounter medications on file as of 04/03/2024.   ALLERGIES: Allergies  Allergen Reactions   Fenoprofen Calcium      VACCINATION STATUS: Immunization History  Administered Date(s) Administered   Tdap 05/23/1998, 11/16/2021   Zoster Recombinant(Shingrix ) 01/21/2022, 04/22/2022    HPI Norma Horton is 53 y.o. female who presents today with a medical history as above. she is being seen in follow-up after she was seen in consultation for multinodular goiter requested by Edman Meade PEDLAR, FNP. She has history of multinodular goiter dating back to 2017.   After fine-needle aspiration biopsy of thyroid  nodule showed malignant results, she was sent for surgical treatment.  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  However, tumor was present at inked edge. She is status post total thyroidectomy for thyroid   malignancy on August 25, 2023. See notes from previous visit.    She was given 68.9 mCi of I-131 on December 08, 2023 with posttherapy whole-body scan on December 18, 2023 which showed intense uptake within the thyroid  bed, findings most consistent with thyroid  remnant within the thyroid  bed, could not completely exclude local adenopathy.  No evidence of distant metastasis.  She did have detectable thyroglobulin on December 08, 2023.    Because of these findings, she was offered thyroid /neck ultrasound before this visit which  showed postsurgical changes after total thyroidectomy.  Approximately 2 cm hypoechoic solid mass in the right hemithyroidectomy surgical bed concerning for residual/recurrent thyroid  malignancy.  No cervical lymphadenopathy.  She was sent for fine-needle aspiration biopsy of this lesion which unfortunately did not reveal any diagnostic material.  See below.  Her most recent unstimulated thyroglobulin is less than 2.0 ng/mL. She presents with improvement in her previously noted dysphagia, occasional shortness of breath, and dysphonia. She is currently on levothyroxine  175 mcg p.o. daily before breakfast.   Her previsit thyroid  function tests are consistent with appropriate suppressive therapy.      She has no family history of thyroid  dysfunction or thyroid  malignancy.  Her other medical problems include type 2 diabetes on metformin  1000 mg p.o. twice daily.  Her point-of-care A1c today is 7.2%, stable compared to her previous presentations.  She has hypertension on treatment with hydrochlorothiazide .   Review of Systems  Constitutional:  No recent weight change, no fatigue, no subjective hyperthermia, no subjective hypothermia Eyes: no blurry vision, no xerophthalmia    Objective:       04/03/2024    9:12 AM 03/22/2024    9:21 AM 03/22/2024    9:12 AM  Vitals with BMI  Height 5' 3    Weight 331 lbs 13 oz    BMI 58.79    Systolic 100 148 867  Diastolic 68 79 75   Pulse 76      BP 100/68   Pulse 76   Ht 5' 3 (1.6 m)   Wt (!) 331 lb 12.8 oz (150.5 kg)   LMP 08/30/2016   BMI 58.78 kg/m   Wt Readings from Last 3 Encounters:  04/03/24 (!) 331 lb 12.8 oz (150.5 kg)  02/22/24 (!) 332 lb 3.2 oz (150.7 kg)  02/09/24 (!) 331 lb 6.4 oz (150.3 kg)    Physical Exam  Constitutional:  Body mass index is 58.78 kg/m.,  not in acute distress, normal state of mind Eyes: PERRLA, EOMI, no exophthalmos ENT: moist mucous membranes, + gross thyromegaly, no gross cervical lymphadenopathy   CMP ( most recent) CMP     Component Value Date/Time   NA 138 01/24/2024 0947   K 4.3 01/24/2024 0947   CL 96 01/24/2024 0947   CO2 22 01/24/2024 0947   GLUCOSE 148 (H) 01/24/2024 0947   GLUCOSE 145 (H) 08/27/2023 0441   BUN 16 01/24/2024 0947   CREATININE 0.80 03/13/2024 0930   CALCIUM  10.0 01/24/2024 0947   PROT 7.6 01/24/2024 0947   ALBUMIN 4.0 01/24/2024 0947   AST 62 (H) 01/24/2024 0947   ALT 54 (H) 01/24/2024 0947   ALKPHOS 100 01/24/2024 0947   BILITOT 0.8 01/24/2024 0947   GFRNONAA >60 08/27/2023 0441   GFRAA >60 11/06/2010 2348    Thyroid  ultrasound on November 19, 2015 FINDINGS: Right thyroid  lobe   Measurements: 6.8 x 3.8 x 4.5 cm. Right upper pole predominately solid nodule measures 4.9 x 3.7 x 4.5 cm   Left thyroid  lobe : Measurements: 4.9 x 1.5 x 1.4 cm. Heterogeneous tissue without focal  nodule.   Isthmus : Thickness: 0.3 cm.  No nodules visualized.   Lymphadenopathy: None visualized.   IMPRESSION: Solitary large right lobe nodule measures 4.9 cm. Findings meet consensus criteria for biopsy. Ultrasound-guided fine needle aspiration should be considered, as per the consensus statement: Management of Thyroid  Nodules Detected at US : Society of Radiologists in Ultrasound Consensus Conference Statement. Radiology 2005; [id number].  Final needle aspiration biopsy of thyroid  nodules  in August 2017 COMMENT: THE SPECIMEN IS HYPERCELLULAR  AND CONSISTS OF SMALL AND MEDIUM SIZED GROUPS OF FOLLICULAR  EPITHELIAL CELLS WITH CYTOLOGIC ATYPIA, INCLUDING NUCLEAR ENLARGEMENT AND OVERLAP.    Patient did not want surgery.  Repeat thyroid  ultrasound was performed on January 21, 2022 showing   Isthmus: 0.3 cm ,previously 0.3 cm   Right lobe: 7.7 x 3.9 x 4.7 cm ,previously 6.8 x 3.8 x 4.5 cm,    Left lobe: 5.2 x 1.1 x 1.5 cm ,previously 4.9 x 1.5 x 1.4 cm  Nodule in the right lobe of her thyroid  size: 6.4 cm; Other 2 dimensions: 4.8 x 3.8 cm, previously,  4.9 x 3.7 x 4.5 cm  FNA 03/20/23 Clinical History: 6.4 cm RML; TI-RADS risk category: TR4 (4-6 pts.)  Specimen Submitted:  A. THYROID , RML, FINE NEEDLE ASPIRATION:    FINAL MICROSCOPIC DIAGNOSIS:  - Atypia of undetermined significance (Bethesda category III)   Afirma: GSC Classifier Candia Classifier - Suspicious ( ~ 50% risk of malignancy).   Thyroidectomy on August 25, 2023 FINAL MICROSCOPIC DIAGNOSIS:   A. THYROID , LEFT, LOBECTOMY:  - Thyroid  follicular hyperplasia/adenomatous nodule  - No malignancy identified   B. THYROID , RIGHT, LOBECTOMY:  - Papillary thyroid  carcinoma, classic type, 0.9 cm  - Tumor is present at inked edge  - No lymphovascular invasion identified  - Thyroid  follicular nodular disease/adenomatous nodules  - See oncology table   Thyroid /neck ultrasound on January 05, 2024 FINDINGS: Postsurgical changes after total thyroidectomy. Within the right hemi thyroid  surgical bed is a lobular, hypoechoic solid mass measuring approximately 2.0 x 1.6 x 1.0 cm. No evidence of cervical lymphadenopathy.   IMPRESSION: 1. Postsurgical changes after total thyroidectomy. 2. Approximately 2 cm hypoechoic solid mass in the right hemithyroidectomy surgical bed, concerning for residual/recurrent thyroid  malignancy. 3. No cervical lymphadenopathy.   Fine-needle aspiration biopsy of this lesion on March 22, 2024  FINAL MICROSCOPIC DIAGNOSIS:  -  Nondiagnostic material   SPECIMEN ADEQUACY:  INSUFFICIENT FOR DIAGNOSIS. The specimen is processed and examined  microscopically, but found to be UNSATISFACTORY for evaluation.   Diabetic Labs (most recent): Lab Results  Component Value Date   HGBA1C 7.2 (A) 11/13/2023   HGBA1C 7.0 (H) 08/23/2023   HGBA1C 7.1 (H) 03/03/2023     Lab Results  Component Value Date   TSH 0.187 (L) 03/22/2024   TSH 0.337 (L) 01/24/2024   TSH 119.250 (H) 12/08/2023   TSH 10.000 (H) 11/02/2023   TSH 2.494 08/23/2023   TSH 3.280 03/03/2023   TSH 3.390 03/03/2023   TSH 2.920 10/31/2022   TSH 3.640 08/10/2022   TSH 2.300 07/25/2022   FREET4 2.03 (H) 03/22/2024   FREET4 1.75 01/24/2024   FREET4 1.07 12/08/2023   FREET4 1.33 11/02/2023   FREET4 1.17 03/03/2023   FREET4 1.16 03/03/2023   FREET4 1.15 10/31/2022   FREET4 1.22 08/10/2022   FREET4 1.28 07/25/2022   FREET4 1.21 04/22/2022    She is status post Thyrogen  stimulated remnant ablation utilizing 66.9 mCi of I-131 FINDINGS: Intense uptake within the thyroid  bed. No evidence abnormal radiotracer outside of the thyroid  bed.   IMPRESSION: 1. Findings most consistent with thyroid  remnant within the thyroid  bed. Cannot completely exclude local adenopathy. Recommend following thyroglobulin levels. 2. No evidence of metastatic disease.  Assessment & Plan:   1.  Thyroid  malignancy   2.  Postsurgical hypothyroidism  3.  Morbid obesity   4  Hypertension   5.  Type 2 diabetes  6. Vitamin D  deficiency   -  Based on these reviews, she has history of nodular goiter, growing overall, and growing nodule on the right lobe.  This right lobe nodule was previously biopsied with bethesda 3 findings in 2017.  After she was approached for a repeat biopsy of a 6.4 cm nodule in July 2024, she opted to avoid it.  She was re-approached due to the fact that she reported occasional compressive symptoms. Hide biopsy results on March 20, 2023 was consistent with  atypia of undetermined significance.  A sample was sent for molecular testing with Afirma gene sequencing classifier which showed approximately 50% risk of malignancy. 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  I had a long discussion with her about treatment course and expectations for differentiated thyroid  cancer. She was given 68.9 mCi of I-131 for thyroid  remnant ablation followed by whole-body scan. Posttherapy whole-body scan revealed possible thyroid  remnant, could not rule out local adenopathy, no evidence of distant metastasis.  Her stimulated thyroglobulin was 7.4 prior to her last visit. Her previsit thyroid /neck ultrasound reveals 2 cm hypoechoic solid mass in the right thyroid  bed.  - She will be sent for fine-needle aspiration biopsy of this lesion was not diagnostic.   Procedure did not produce sufficient diagnostic material.  Her previsit labs showed undetectable thyroglobulin levels.  .  This puts her in the category of indeterminate treatment response.  She would be considered for Thyrogen  stimulated whole-body scan before her next visit.    - For her postsurgical hypothyroidism, she is advised to continue levothyroxine  175 mcg p.o. daily before breakfast.    - We discussed about the correct intake of her thyroid  hormone, on empty stomach at fasting, with water , separated by at least 30 minutes from breakfast and other medications,  and separated by more than 4 hours from calcium , iron, multivitamins, acid reflux medications (PPIs). -Patient is made aware of the fact that thyroid  hormone replacement is needed for life, dose to be adjusted by periodic monitoring of thyroid  function tests.   -For her diabetes type 2 complicated by morbid obesity, she will benefit from GLP-1 receptor agonists which she will be considered for after completion of her thyroid  cancer treatment.  Her BMI is 58.78 kg/m. In  the meantime, she is advised to continue metformin  1000 mg p.o. twice daily.  Her recent A1c was 7.2%.     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  She is under GI evaluation and workup.    - Her blood pressure was controlled to target.  She is advised to continue chlorthalidone  25 mg p.o. daily at breakfast.  She is also encouraged to continue on her vitamin D2 50,000 units weekly.   - she is advised to maintain close follow up with Bacchus, Meade PEDLAR, FNP for primary care needs.   I spent  26  minutes in the care of the patient today including review of labs from Thyroid  Function, CMP, and other relevant labs ; imaging/biopsy records (current and previous including abstractions from other facilities); face-to-face time discussing  her lab results and symptoms, medications doses, her options of short and long term treatment based on the latest standards of care / guidelines;   and documenting the encounter.  Norma Horton  participated in the discussions, expressed understanding, and voiced agreement with the above plans.  All questions were answered to her satisfaction. she is encouraged to contact clinic should  she have any questions or concerns prior to her return visit.   Follow up plan: Return in about 8 weeks (around 05/29/2024) for F/U with Whole Body Scan w/Thyrogen .   Ranny Earl, MD [facility] [address] [place], [place] [address] Phone: [phone]  Fax: [phone]     04/03/2024, 11:15 AM  This note was partially dictated with voice recognition software. Similar sounding words can be transcribed inadequately or may not  be corrected upon review.

## 2024-04-04 NOTE — Written Directive (Cosign Needed)
 MOLECULAR IMAGING AND THERAPEUTICS WRITTEN DIRECTIVE   PATIENT NAME: Norma Horton  PT DOB:   1970-06-28                                              MRN: [id number]  ---------------------------------------------------------------------------------------------------------------------  I-131 WHOLE BODY SCAN    RADIOPHARMACEUTICAL: Iodine-131 Capsule for Diagnostic Imaging   PRESCRIBED DOSE FOR ADMINISTRATION: 4 mCi   ROUTE OFADMINISTRATION: PO   DIAGNOSIS: Papillary Thyroid  Carcinoma   REFERRING PHYSICIAN: Dr. Nida   THYROGEN  STIMULATION OR HORMONE WITHDRAW: Thyrogen  Stimulation   DATE OF THYROIDECTOMY: 08-25-23   SURGEON:  Dr. Mavis   TSH:   Lab Results  Component Value Date   TSH 0.187 (L) 03/22/2024   TSH 0.337 (L) 01/24/2024   TSH 119.250 (H) 12/08/2023     PRIOR I-131 THERAPY (Date and Dose): 12-08-23  68.9 mCi   ADDITIONAL PHYSICIAN COMMENTS/NOTES  Elevated Thyroglobulin following remnant ablation AUTHORIZED USER SIGNATURE & TIME STAMP: Norleen GORMAN Boxer, MD   04/09/24    8:52 AM

## 2024-04-10 ENCOUNTER — Other Ambulatory Visit: Payer: Self-pay | Admitting: "Endocrinology

## 2024-04-10 ENCOUNTER — Inpatient Hospital Stay (HOSPITAL_COMMUNITY)
Admission: RE | Admit: 2024-04-10 | Discharge: 2024-04-10 | Disposition: A | Source: Ambulatory Visit | Attending: "Endocrinology | Admitting: "Endocrinology

## 2024-04-10 ENCOUNTER — Encounter (HOSPITAL_COMMUNITY): Payer: Self-pay

## 2024-04-10 DIAGNOSIS — C73 Malignant neoplasm of thyroid gland: Secondary | ICD-10-CM | POA: Insufficient documentation

## 2024-04-10 MED ORDER — STERILE WATER FOR INJECTION IJ SOLN
INTRAMUSCULAR | Status: AC
  Filled 2024-04-10: qty 10

## 2024-04-10 MED ORDER — THYROTROPIN ALFA 0.9 MG IM SOLR
INTRAMUSCULAR | Status: AC
  Filled 2024-04-10: qty 0.9

## 2024-04-10 MED ORDER — THYROTROPIN ALFA 0.9 MG IM SOLR
0.9000 mg | INTRAMUSCULAR | Status: AC
  Administered 2024-04-10: 0.9 mg via INTRAMUSCULAR

## 2024-04-10 MED ORDER — STERILE WATER FOR INJECTION IJ SOLN
1.2000 mL | Freq: Once | INTRAMUSCULAR | Status: AC
  Administered 2024-04-10: 1.2 mL via INTRAMUSCULAR

## 2024-04-11 ENCOUNTER — Encounter (HOSPITAL_COMMUNITY)
Admission: RE | Admit: 2024-04-11 | Discharge: 2024-04-11 | Disposition: A | Source: Ambulatory Visit | Attending: "Endocrinology | Admitting: "Endocrinology

## 2024-04-11 DIAGNOSIS — C73 Malignant neoplasm of thyroid gland: Secondary | ICD-10-CM

## 2024-04-11 MED ORDER — STERILE WATER FOR INJECTION IJ SOLN
INTRAMUSCULAR | Status: AC
  Filled 2024-04-11: qty 10

## 2024-04-11 MED ORDER — STERILE WATER FOR INJECTION IJ SOLN
1.2000 mL | Freq: Once | INTRAMUSCULAR | Status: AC
  Administered 2024-04-11: 1.2 mL via INTRAMUSCULAR

## 2024-04-11 MED ORDER — THYROTROPIN ALFA 0.9 MG IM SOLR
0.9000 mg | INTRAMUSCULAR | Status: AC
  Administered 2024-04-11: 0.9 mg via INTRAMUSCULAR

## 2024-04-11 MED ORDER — THYROTROPIN ALFA 0.9 MG IM SOLR
INTRAMUSCULAR | Status: AC
  Filled 2024-04-11: qty 0.9

## 2024-04-12 ENCOUNTER — Encounter (HOSPITAL_COMMUNITY)
Admission: RE | Admit: 2024-04-12 | Discharge: 2024-04-12 | Disposition: A | Source: Ambulatory Visit | Attending: "Endocrinology | Admitting: "Endocrinology

## 2024-04-12 ENCOUNTER — Other Ambulatory Visit (HOSPITAL_COMMUNITY)
Admission: RE | Admit: 2024-04-12 | Discharge: 2024-04-12 | Disposition: A | Source: Ambulatory Visit | Attending: "Endocrinology | Admitting: "Endocrinology

## 2024-04-12 LAB — T4, FREE: Free T4: 0.97 ng/dL (ref 0.61–1.12)

## 2024-04-12 LAB — TSH: TSH: 130 u[IU]/mL — ABNORMAL HIGH (ref 0.350–4.500)

## 2024-04-12 MED ORDER — SODIUM IODIDE I 131 CAPSULE
4.0000 | Freq: Once | INTRAVENOUS | Status: AC | PRN
  Administered 2024-04-12: 4.1 via ORAL

## 2024-04-13 LAB — THYROGLOBULIN ANTIBODY: Thyroglobulin Antibody: <1 [IU]/mL (ref 0.0–0.9)

## 2024-04-15 ENCOUNTER — Inpatient Hospital Stay (HOSPITAL_COMMUNITY): Admission: RE | Admit: 2024-04-15 | Discharge: 2024-04-15 | Attending: "Endocrinology | Admitting: "Endocrinology

## 2024-04-15 ENCOUNTER — Ambulatory Visit: Payer: Self-pay | Admitting: Internal Medicine

## 2024-04-18 ENCOUNTER — Ambulatory Visit (INDEPENDENT_AMBULATORY_CARE_PROVIDER_SITE_OTHER): Admitting: Gastroenterology

## 2024-04-18 ENCOUNTER — Encounter: Payer: Self-pay | Admitting: Gastroenterology

## 2024-04-18 VITALS — BP 127/83 | HR 63 | Temp 97.8°F | Ht 63.0 in | Wt 333.2 lb

## 2024-04-18 DIAGNOSIS — B1920 Unspecified viral hepatitis C without hepatic coma: Secondary | ICD-10-CM

## 2024-04-18 DIAGNOSIS — R748 Abnormal levels of other serum enzymes: Secondary | ICD-10-CM

## 2024-04-18 DIAGNOSIS — K7581 Nonalcoholic steatohepatitis (NASH): Secondary | ICD-10-CM

## 2024-04-18 DIAGNOSIS — R7689 Other specified abnormal immunological findings in serum: Secondary | ICD-10-CM | POA: Diagnosis not present

## 2024-04-18 DIAGNOSIS — D509 Iron deficiency anemia, unspecified: Secondary | ICD-10-CM

## 2024-04-18 DIAGNOSIS — R1032 Left lower quadrant pain: Secondary | ICD-10-CM

## 2024-04-18 DIAGNOSIS — E611 Iron deficiency: Secondary | ICD-10-CM | POA: Diagnosis not present

## 2024-04-18 DIAGNOSIS — R7989 Other specified abnormal findings of blood chemistry: Secondary | ICD-10-CM

## 2024-04-18 DIAGNOSIS — N949 Unspecified condition associated with female genital organs and menstrual cycle: Secondary | ICD-10-CM

## 2024-04-18 DIAGNOSIS — K5900 Constipation, unspecified: Secondary | ICD-10-CM

## 2024-04-18 DIAGNOSIS — N83209 Unspecified ovarian cyst, unspecified side: Secondary | ICD-10-CM

## 2024-04-18 DIAGNOSIS — K760 Fatty (change of) liver, not elsewhere classified: Secondary | ICD-10-CM

## 2024-04-18 DIAGNOSIS — K5904 Chronic idiopathic constipation: Secondary | ICD-10-CM

## 2024-04-18 NOTE — Progress Notes (Signed)
 GI Office Note    Referring Provider: Edman Meade PEDLAR, FNP Primary Care Physician:  Edman Meade PEDLAR, FNP Primary Gastroenterologist: Carlin POUR. Cindie, DO  Date:  04/18/2024  ID:  Norma Horton, DOB February 25, 1971, MRN [id number]  Chief Complaint   Chief Complaint  Patient presents with   Follow-up    Follow up. Needs to go over test results    History of Present Illness  Norma Horton is a 53 y.o. female with a history of hypothyroidism, concern for thyroid  cancer, type 2 diabetes, anxiety/depression, asthma, HSV, HTN presenting today for follow-up of lab results and imaging.  Last office visit 02/22/2024.  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  She had blood work from September 2025 which showed an AST of 62, ALT 54, alk phos 100, T. bili 0.8, albumin 4 along with a CBC that was performed in April 2025 with a platelet count of 254.  Fib four 1.65 which is indeterminate for advanced liver fibrosis and elf of 9.93 which indicates mild risk.  She has not had any imaging of her liver at that time.  She does have an elevated BMI at 58 at the time, diabetes, HTN, dyslipidemia.  Denied any drug use, exposure or viral hepatitis that she is aware of, alcohol use and no herbal supplementation or family history of liver disease.  Also has chronic reflux which was well-controlled on pantoprazole  without any breakthrough symptoms.  She had also never had a colonoscopy, including noninvasive testing.  Denied family history of colon cancer.  Many options in regard to colon cancer screening were discussed with her for which Cologuard was preferred therefore ordered.  Further serologic workup was performed and CT abdomen pelvis was also ordered given ultrasound likely to be less accurate given body habitus.  She was recommended a fatty liver diet and determined she may be a good candidate for GLP-1 after her thyroid  cancer has been adequately controlled.  CT abdomen  pelvis with contrast 03/13/2024: Moderate to severe hepatic steatosis, no evidence of hepatic mass or biliary ductal dilation Left adnexal cyst concerning for low-grade cystic neoplasm, gynecology consult recommended and consider MRI with IV contrast if clinically warranted.  Labs 03/22/24: TSH 0.187, T4 2.03, immunoglobulins within normal limits, ANA, ASMA normal.  Hepatitis A and B negative and no immunity to hepatitis A or B.  Hep C antibody reactive.  Celiac panel negative.  Iron saturation 13%, ferritin 159.  Today:  Discussed the use of AI scribe software for clinical note transcription with the patient, who gave verbal consent to proceed. She requested to review her recent labs.   Recent laboratory tests revealed elevated liver enzymes, prompting further investigation. She has no history of drug use, tattoos, or accidental needle exposures. Her gallbladder has been removed, and she has undergone a tubal ligation. No prior blood transfusions have been reported. A viral hepatitis panel indicated a potential hepatitis C infection, though further testing is required to confirm active infection and determine the genotype.  A CT scan showed moderate hepatic steatosis. She has a history of diabetes, overweight, and high blood pressure, which are risk factors for fatty liver disease. Her iron levels are slightly low. She has a history of occasional iron deficiency.   She experiences occasional constipation with tenderness in the left lower abdomen. She has bowel movements every other day but sometimes feels incomplete evacuation. She has a cyst on her ovary, and further evaluation by a gynecologist has been  recommended.  She is divorced and has four children. Her boyfriend smokes, but she tries to avoid exposure. She has had different sexual partners since her prior divorce.       Wt Readings from Last 6 Encounters:  04/18/24 (!) 333 lb 3.2 oz (151.1 kg)  04/03/24 (!) 331 lb 12.8 oz (150.5 kg)   02/22/24 (!) 332 lb 3.2 oz (150.7 kg)  02/09/24 (!) 331 lb 6.4 oz (150.3 kg)  02/08/24 (!) 330 lb 0.6 oz (149.7 kg)  12/25/23 (!) 336 lb 9.6 oz (152.7 kg)   Body mass index is 59.02 kg/m.  Current Outpatient Medications  Medication Sig Dispense Refill   albuterol  (VENTOLIN  HFA) 108 (90 Base) MCG/ACT inhaler INHALE 2 PUFFS BY MOUTH EVERY 6 HOURS AS NEEDED FOR WHEEZING AND FOR SHORTNESS OF BREATH 9 g 0   budesonide -formoterol  (BREYNA ) 80-4.5 MCG/ACT inhaler Inhale 2 puffs into the lungs 2 (two) times daily. 10.2 g 3   calcium  carbonate (OS-CAL - DOSED IN MG OF ELEMENTAL CALCIUM ) 1250 (500 Ca) MG tablet Take 1 tablet (1,250 mg total) by mouth 2 (two) times daily with a meal. 60 tablet 1   chlorthalidone  (HYGROTON ) 25 MG tablet Take 1 tablet by mouth once daily 90 tablet 0   Cyanocobalamin (B-12 PO) Take 1 tablet by mouth daily.     gabapentin  (NEURONTIN ) 100 MG capsule Take 1 capsule (100 mg total) by mouth at bedtime. 30 capsule 2   Insulin  Pen Needle (BD PEN NEEDLE NANO U/F) 32G X 4 MM MISC 1 each by Does not apply route 4 (four) times daily. 100 each 1   levothyroxine  (SYNTHROID ) 175 MCG tablet Take 1 tablet (175 mcg total) by mouth daily before breakfast. 90 tablet 1   metFORMIN  (GLUCOPHAGE ) 1000 MG tablet Take 1 tablet (1,000 mg total) by mouth 2 (two) times daily with a meal. 180 tablet 3   montelukast  (SINGULAIR ) 10 MG tablet Take 1 tablet (10 mg total) by mouth at bedtime. 30 tablet 0   oxyCODONE  (ROXICODONE ) 5 MG immediate release tablet Take 1 tablet (5 mg total) by mouth every 4 (four) hours as needed. 20 tablet 0   pantoprazole  (PROTONIX ) 40 MG tablet Take 1 tablet by mouth once daily 60 tablet 0   sertraline  (ZOLOFT ) 100 MG tablet Take 1 tablet (100 mg total) by mouth daily. 30 tablet 5   valACYclovir (VALTREX) 500 MG tablet Take 500 mg by mouth daily.     Vitamin D , Ergocalciferol , (DRISDOL ) 1.25 MG (50000 UNIT) CAPS capsule Take 1 capsule (50,000 Units total) by mouth every 7  (seven) days. 20 capsule 1   promethazine -dextromethorphan (PROMETHAZINE -DM) 6.25-15 MG/5ML syrup Take 5 mLs by mouth 4 (four) times daily as needed. (Patient not taking: Reported on 04/18/2024) 118 mL 0   No current facility-administered medications for this visit.    Past Medical History:  Diagnosis Date   Anxiety    Asthma    Depression    Pt has had suicide ideations in the past.   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    Past Surgical History:  Procedure Laterality Date   BIOPSY THYROID   03/2023   CHOLECYSTECTOMY     THYROIDECTOMY N/A 08/25/2023   Procedure: TOTAL THYROIDECTOMY;  Surgeon: Mavis Anes, MD;  Location: [facility];  Service: General;  Laterality: N/A;  TOTAL   TUBAL LIGATION      Family History  Problem  Relation Age of Onset   Breast cancer Maternal Grandmother    Heart attack Father    Hypertension Father    COPD Mother    Diabetes Mellitus II Brother    Throat cancer Brother    Cerebral aneurysm Sister    Cervical cancer Sister    Cervical cancer Sister    Heart attack Sister    Diabetes Daughter    Asthma Daughter    Cystic fibrosis Daughter    Hypertension Other     Allergies as of 04/18/2024 - Review Complete 04/18/2024  Allergen Reaction Noted   Fenoprofen calcium   11/06/2010    Social History   Socioeconomic History   Marital status: Divorced    Spouse name: Not on file   Number of children: 4   Years of education: Not on file   Highest education level: Not on file  Occupational History   Not on file  Tobacco Use   Smoking status: Never   Smokeless tobacco: Never  Vaping Use   Vaping status: Never Used  Substance and Sexual Activity   Alcohol use: Not Currently    Comment: Denied use as of 04/20/2023   Drug use: No   Sexual activity: Yes    Birth control/protection: Surgical, Post-menopausal    Comment: tubal  Other Topics Concern   Not on file  Social History Narrative    Lives with her husband and her children    Social Drivers of Health   Tobacco Use: Low Risk (04/18/2024)   Patient History    Smoking Tobacco Use: Never    Smokeless Tobacco Use: Never    Passive Exposure: Not on file  Financial Resource Strain: Medium Risk (02/03/2022)   Overall Financial Resource Strain (CARDIA)    Difficulty of Paying Living Expenses: Somewhat hard  Food Insecurity: No Food Insecurity (08/25/2023)   Hunger Vital Sign    Worried About Running Out of Food in the Last Year: Never true    Ran Out of Food in the Last Year: Never true  Transportation Needs: No Transportation Needs (08/25/2023)   PRAPARE - Administrator, Civil Service (Medical): No    Lack of Transportation (Non-Medical): No  Physical Activity: Inactive (02/03/2022)   Exercise Vital Sign    Days of Exercise per Week: 0 days    Minutes of Exercise per Session: 0 min  Stress: Stress Concern Present (02/03/2022)   [name]    Feeling of Stress : Very much  Social Connections: Moderately Isolated (02/03/2022)   Social Connection and Isolation Panel    Frequency of Communication with Friends and Family: More than three times a week    Frequency of Social Gatherings with Friends and Family: Twice a week    Attends Religious Services: Never    Database Administrator or Organizations: No    Attends Banker Meetings: Never    Marital Status: Married  Depression (PHQ2-9): Medium Risk (02/08/2024)   Depression (PHQ2-9)    PHQ-2 Score: 9  Alcohol Screen: Low Risk (10/28/2022)   Alcohol Screen    Last Alcohol Screening Score (AUDIT): 1  Housing: Low Risk (08/25/2023)   Housing Stability Vital Sign    Unable to Pay for Housing in the Last Year: No    Number of Times Moved in the Last Year: 0    Homeless in the Last Year: No  Utilities: Not At Risk (08/25/2023)   [facility] Utilities  Threatened with loss of utilities: No   Health Literacy: Not on file    Review of Systems   Gen: Denies fever, chills, anorexia. Denies fatigue, weakness, weight loss.  CV: Denies chest pain, palpitations, syncope, peripheral edema, and claudication. Resp: + cough. + wheezing. Denies dyspnea at rest, coughing up blood, and pleurisy. GI: See HPI Derm: Denies rash, itching, dry skin Psych: Denies depression, anxiety, memory loss, confusion. No homicidal or suicidal ideation.  Heme: Denies bruising, bleeding, and enlarged lymph nodes.  Physical Exam   BP 127/83 (BP Location: Right Arm, Patient Position: Standing, Cuff Size: Large)   Pulse 63   Temp 97.8 F (36.6 C) (Temporal)   Ht 5' 3 (1.6 m)   Wt (!) 333 lb 3.2 oz (151.1 kg)   LMP 08/30/2016   BMI 59.02 kg/m   General:   Alert and oriented. No distress noted. Pleasant and cooperative.  Head:  Normocephalic and atraumatic. Eyes:  Conjuctiva clear without scleral icterus. Mouth:  Oral mucosa pink and moist. Good dentition. No lesions. Lungs:  bilateral expiratory wheezing.  Heart:  S1, S2 present without murmurs appreciated.  Abdomen:  +BS, soft, non-distended, obese.  Mild TTP to LLQ.  No rebound or guarding. UTA for HSM given body habitus. Rectal: Deferred. Msk:  Symmetrical without gross deformities. Normal posture. Extremities:  Without edema. Neurologic:  Alert and  oriented x4 Psych:  Alert and cooperative. Normal mood and affect.  Assessment & Plan  Norma Horton is a 53 y.o. female presenting today for follow-up on her lab results and imaging.     Evaluation of elevated liver enzymes with possible hepatitis C and hepatic steatosis Elevated liver enzymes with possible hepatitis C infection. Positive hepatitis C antibody suggests prior exposure, but further testing is needed to confirm active infection. 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  DIfferential includes fatty liver disease and  potential progression to cirrhosis. Hepatitis C is highly treatable with current regimens, which have high success rates. Potential interaction with thyroid  treatment needs to be considered if hepatitis C is confirmed. - Ordered hepatitis C viral load and genotype testing and HIV. - Provided hepatitis C counseling in person and written education. - Will coordinate with endocrinologist regarding potential interactions between hepatitis C treatment and thyroid  treatment - Monitor liver enzymes and assess for progression to cirrhosis - Recommended fatty liver diet.  Iron deficiency Likely due to absorption issues, possibly related to thyroid  condition. Iron supplementation recommended to improve blood counts, especially in light of upcoming radiation and potential surgery.  Hemoglobin normal in April 2025, will recheck hemoglobin.  May need to consider endoscopic workup in the future - Recommended over-the-counter iron supplementation, one pill daily - CBC  Adnexal cyst (incidental finding) Incidental finding of ovarian cyst on imaging. Differential includes benign cyst or potential malignancy.  Radiologist interpretation recommended MRI with IV contrast as given the size it is concerning for low-grade cystic neoplasm.  Further evaluation needed to rule out cancer, especially given thyroid  tumor. - Referred to gynecology for further evaluation and potential MRI  Constipation, LLQ tenderness Intermittent constipation with incomplete bowel movements. Possible contribution from ovarian cyst causing pressure to LLQ. - Recommended Miralax or stool softener as needed  -Referring to GYN for further evaluation of adnexal cyst     Follow up   Follow up to be determined after receiving lab work.    Charmaine Melia, MSN, FNP-BC, AGACNP-BC [facility]

## 2024-04-18 NOTE — Patient Instructions (Addendum)
 I have ordered some additional blood work for you to further assess for  possibility of hepatitis C.  If you have active virus within your system then I will start the process for submitting for treatment for you and discussed that with Dr. Anice as well to make sure nothing going to interact with your radiation and thyroid  treatment.  For now you to follow the hepatitis C precautions I have outlined below.  Hep C counseling: Avoid sharing toothbrushes and dental or shaving equipment Cover any bleeding wound to prevent others from coming into contact with their blood Do Not donate blood Discuss HCV status prior to donation of body organs, other tissues, or semen Any sexual partners you have been with or are with frequently should be screened. Clean household surfaces exposed to your blood with bleach (1:9 ratio) and wear gloves Cessation of alcohol, tobacco, and marijuana Do not exceed more than 2000 mg of tylenol  in 24 hours  We will get you a referral to gynecology to further evaluate you for the cyst found on your CT scan of your abdomen and pelvis.   Instructions for fatty liver: Recommend 1-2# weight loss per week until ideal body weight through exercise & diet. Low fat/cholesterol diet.   Avoid sweets, sodas, fruit juices, sweetened beverages like tea, etc. Gradually increase exercise from 15 min daily up to 1 hr per day 5 days/week. Limit alcohol use.  For intermittent constipation which to start taking a stool softener or MiraLAX daily or every other day to have more productive bowel movements.  I would like for you to start taking a daily iron supplement to help boost your iron  levels.  I also recommend that you complete your Cologuard is soon as possible.  We will determine follow-up after I received your lab results.  It was a pleasure to see you today. I want to create trusting relationships with patients. If you receive a survey regarding your visit,  I greatly appreciate  you taking time to fill this out on paper or through your MyChart. I value your feedback.  Charmaine Melia, MSN, FNP-BC, AGACNP-BC [facility]

## 2024-04-19 LAB — HCV RNA QUANT

## 2024-04-20 LAB — HIV ANTIBODY (ROUTINE TESTING W REFLEX): HIV Screen 4th Generation wRfx: NONREACTIVE

## 2024-04-20 LAB — CBC
Hematocrit: 44.5 % (ref 34.0–46.6)
Hemoglobin: 14.7 g/dL (ref 11.1–15.9)
MCH: 30 pg (ref 26.6–33.0)
MCHC: 33 g/dL (ref 31.5–35.7)
MCV: 91 fL (ref 79–97)
Platelets: 305 x10E3/uL (ref 150–450)
RBC: 4.9 x10E6/uL (ref 3.77–5.28)
RDW: 13 % (ref 11.7–15.4)
WBC: 7.6 x10E3/uL (ref 3.4–10.8)

## 2024-04-20 LAB — HCV RNA QUANT: Hepatitis C Quantitation: NOT DETECTED [IU]/mL

## 2024-04-20 LAB — HEPATITIS C GENOTYPE

## 2024-04-20 LAB — THYROGLOBULIN LEVEL: Thyroglobulin: <2 ng/mL

## 2024-05-04 ENCOUNTER — Ambulatory Visit: Payer: Self-pay | Admitting: Gastroenterology

## 2024-05-09 ENCOUNTER — Ambulatory Visit (INDEPENDENT_AMBULATORY_CARE_PROVIDER_SITE_OTHER): Admitting: Adult Health

## 2024-05-09 ENCOUNTER — Encounter: Payer: Self-pay | Admitting: Adult Health

## 2024-05-09 VITALS — BP 120/83 | HR 70 | Ht 63.0 in | Wt 329.8 lb

## 2024-05-09 DIAGNOSIS — R1900 Intra-abdominal and pelvic swelling, mass and lump, unspecified site: Secondary | ICD-10-CM | POA: Diagnosis not present

## 2024-05-09 DIAGNOSIS — N941 Unspecified dyspareunia: Secondary | ICD-10-CM | POA: Diagnosis not present

## 2024-05-09 NOTE — Progress Notes (Signed)
" °  Subjective:     Patient ID: Norma Horton, female   DOB: 02-Aug-1970, 54 y.o.   MRN: [id number]  HPI Norma Horton is a 54 year old white female, divorced, PM in for follow up on having had CT 03/13/24, and has 11.7 cm, simple left adnexal cyst. She does not have pain. But does have pain with sex at times.     Component Value Date/Time   DIAGPAP  02/03/2022 1003    - Negative for intraepithelial lesion or malignancy (NILM)   HPVHIGH Negative 02/03/2022 1003   ADEQPAP  02/03/2022 1003    Satisfactory for evaluation; transformation zone component ABSENT.    PCP is Meade Gerlach FNP Review of Systems +pain with sex Denies any pelvic pain or vaginal bleeding Has had ear pain and cough, was seen at Urgent Care and prescribed inhaler and antibiotics, still feels sick, to see PCP Reviewed past medical,surgical, social and family history. Reviewed medications and allergies.     Objective:   Physical Exam BP 120/83 (BP Location: Right Arm, Patient Position: Sitting, Cuff Size: Large)   Pulse 70   Ht 5' 3 (1.6 m)   Wt (!) 329 lb 12.8 oz (149.6 kg)   LMP 08/30/2016   BMI 58.42 kg/m     Skin warm and dry.  Lungs: clear to ausculation bilaterally. Cardiovascular: regular rate and rhythm.  Reviewed CT with her: has simple appearing left adnexal cyst measuring 11.7 cm.  Fall risk is low  Upstream - 05/09/24 1018       Pregnancy Intention Screening   Does the patient want to become pregnant in the next year? N/A    Does the patient's partner want to become pregnant in the next year? N/A    Would the patient like to discuss contraceptive options today? N/A      Contraception Wrap Up   Current Method Female Sterilization;Post-Menopause    End Method Female Sterilization;Post-Menopause    Contraception Counseling Provided No          Assessment:     1. Pelvic mass (Primary) +has simple appearing left adnexal cyst measuring 11.7 cm. Seen on CT 03/13/24, denies any pain  Discussed with  Dr Ozan, will get pelvic US  in office to assess for any changes in size Will then get her to see either Dr Ozan or Dr Jayne for possible removal if needed   - US  PELVIC COMPLETE WITH TRANSVAGINAL; Future  2. Dyspareunia, female Has pain with sex at times     Plan:     Return 05/14/24 at 1:30 pm for pelvic US , will talk when US  results in     "

## 2024-05-14 ENCOUNTER — Other Ambulatory Visit

## 2024-05-14 DIAGNOSIS — R1900 Intra-abdominal and pelvic swelling, mass and lump, unspecified site: Secondary | ICD-10-CM

## 2024-05-14 NOTE — Progress Notes (Signed)
 PELVIC US  TA/TV:heterogeneous anteverted uterus,(#1) fundal subserosal fibroid 5.3 x 4.8 x 4.8 cm,homogeneous thickened endometrium 9.5 mm,limited view of right ovary,simple unilocular avascular left adnexal cyst 11.1 x 9.8 x 7.4 cm,no free fluid,limited view because of patients body habitus,best imaged transabdominally

## 2024-05-15 ENCOUNTER — Encounter: Payer: Self-pay | Admitting: Internal Medicine

## 2024-05-15 ENCOUNTER — Ambulatory Visit: Payer: Self-pay | Admitting: Internal Medicine

## 2024-05-15 DIAGNOSIS — J4541 Moderate persistent asthma with (acute) exacerbation: Secondary | ICD-10-CM

## 2024-05-15 MED ORDER — METHYLPREDNISOLONE SODIUM SUCC 125 MG IJ SOLR
125.0000 mg | Freq: Once | INTRAMUSCULAR | Status: AC
  Administered 2024-05-15: 125 mg via INTRAMUSCULAR

## 2024-05-15 MED ORDER — BUDESONIDE-FORMOTEROL FUMARATE 80-4.5 MCG/ACT IN AERO: 2.0000 | INHALATION_SPRAY | Freq: Two times a day (BID) | RESPIRATORY_TRACT | 3 refills | Status: AC

## 2024-05-15 MED ORDER — METHYLPREDNISOLONE 4 MG PO TBPK: ORAL_TABLET | ORAL | 0 refills | Status: DC

## 2024-05-15 MED ORDER — AZITHROMYCIN 250 MG PO TABS: ORAL_TABLET | ORAL | 0 refills | Status: AC

## 2024-05-15 NOTE — Progress Notes (Signed)
 "  Acute Office Visit  Subjective:    Patient ID: Norma Horton, female    DOB: 07/11/1970, 54 y.o.   MRN: [id number]  Chief Complaint  Patient presents with   Wheezing    Pt reports sx of cough wheezing and congestion. States she finished antibiotic given at urgent care.    HPI Patient is in today for complaint of dry cough, recent worsening of dyspnea and wheezing for the last 3 weeks.  She recently went to urgent care, was given nebulizer treatment in the urgent care, and was given oral amoxicillin  upon discharge.  She has persistent cough and wheezing despite completing the antibiotic.  Denies any fever or chills.  She currently has Breyna  as maintenance inhaler, but compliance is questionable.  She also has albuterol  inhaler and DuoNeb as rescue treatment.  Denies any chest pain or palpitations currently.  Past Medical History:  Diagnosis Date   Anxiety    Asthma    Depression    Pt has had suicide ideations in the past.   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    Past Surgical History:  Procedure Laterality Date   BIOPSY THYROID   03/2023   CHOLECYSTECTOMY     THYROIDECTOMY N/A 08/25/2023   Procedure: TOTAL THYROIDECTOMY;  Surgeon: Mavis Anes, MD;  Location: [facility];  Service: General;  Laterality: N/A;  TOTAL   TUBAL LIGATION      Family History  Problem Relation Age of Onset   Breast cancer Maternal Grandmother    Heart attack Father    Hypertension Father    COPD Mother    Diabetes Mellitus II Brother    Throat cancer Brother    Cerebral aneurysm Sister    Cervical cancer Sister    Cervical cancer Sister    Heart attack Sister    Diabetes Daughter    Asthma Daughter    Cystic fibrosis Daughter    Hypertension Other     Social History   Socioeconomic History   Marital status: Divorced    Spouse name: Not on file   Number of children: 4   Years of education: Not on file    Highest education level: Not on file  Occupational History   Not on file  Tobacco Use   Smoking status: Never   Smokeless tobacco: Never  Vaping Use   Vaping status: Never Used  Substance and Sexual Activity   Alcohol use: Yes    Comment: occ   Drug use: No   Sexual activity: Yes    Birth control/protection: Surgical, Post-menopausal    Comment: tubal  Other Topics Concern   Not on file  Social History Narrative   Lives with her husband and her children    Social Drivers of Health   Tobacco Use: Low Risk (05/15/2024)   Patient History    Smoking Tobacco Use: Never    Smokeless Tobacco Use: Never    Passive Exposure: Not on file  Financial Resource Strain: Medium Risk (02/03/2022)   Overall Financial Resource Strain (CARDIA)    Difficulty of Paying Living Expenses: Somewhat hard  Food Insecurity: No Food Insecurity (08/25/2023)   Hunger Vital Sign    Worried About Running Out of Food in the Last Year: Never true    Ran Out of Food in the Last Year: Never true  Transportation Needs: No Transportation Needs (  08/25/2023)   PRAPARE - Administrator, Civil Service (Medical): No    Lack of Transportation (Non-Medical): No  Physical Activity: Inactive (02/03/2022)   Exercise Vital Sign    Days of Exercise per Week: 0 days    Minutes of Exercise per Session: 0 min  Stress: Stress Concern Present (02/03/2022)   [name]    Feeling of Stress : Very much  Social Connections: Moderately Isolated (02/03/2022)   Social Connection and Isolation Panel    Frequency of Communication with Friends and Family: More than three times a week    Frequency of Social Gatherings with Friends and Family: Twice a week    Attends Religious Services: Never    Database Administrator or Organizations: No    Attends Banker Meetings: Never    Marital Status: Married  Catering Manager Violence: Not At Risk  (08/25/2023)   Humiliation, Afraid, Rape, and Kick questionnaire    Fear of Current or Ex-Partner: No    Emotionally Abused: No    Physically Abused: No    Sexually Abused: No  Depression (PHQ2-9): Medium Risk (02/08/2024)   Depression (PHQ2-9)    PHQ-2 Score: 9  Alcohol Screen: Low Risk (10/28/2022)   Alcohol Screen    Last Alcohol Screening Score (AUDIT): 1  Housing: Low Risk (08/25/2023)   Housing Stability Vital Sign    Unable to Pay for Housing in the Last Year: No    Number of Times Moved in the Last Year: 0    Homeless in the Last Year: No  Utilities: Not At Risk (08/25/2023)   AHC Utilities    Threatened with loss of utilities: No  Health Literacy: Not on file    Outpatient Medications Prior to Visit  Medication Sig Dispense Refill   albuterol  (VENTOLIN  HFA) 108 (90 Base) MCG/ACT inhaler INHALE 2 PUFFS BY MOUTH EVERY 6 HOURS AS NEEDED FOR WHEEZING AND FOR SHORTNESS OF BREATH 9 g 0   calcium  carbonate (OS-CAL - DOSED IN MG OF ELEMENTAL CALCIUM ) 1250 (500 Ca) MG tablet Take 1 tablet (1,250 mg total) by mouth 2 (two) times daily with a meal. 60 tablet 1   chlorthalidone  (HYGROTON ) 25 MG tablet Take 1 tablet by mouth once daily 90 tablet 0   Cyanocobalamin (B-12 PO) Take 1 tablet by mouth daily.     Ferrous Sulfate (IRON PO) Take by mouth.     ipratropium-albuterol  (DUONEB) 0.5-2.5 (3) MG/3ML SOLN      levothyroxine  (SYNTHROID ) 175 MCG tablet Take 1 tablet (175 mcg total) by mouth daily before breakfast. 90 tablet 1   metFORMIN  (GLUCOPHAGE ) 1000 MG tablet Take 1 tablet (1,000 mg total) by mouth 2 (two) times daily with a meal. 180 tablet 3   montelukast  (SINGULAIR ) 10 MG tablet Take 1 tablet (10 mg total) by mouth at bedtime. 30 tablet 0   pantoprazole  (PROTONIX ) 40 MG tablet Take 1 tablet by mouth once daily 60 tablet 0   promethazine -dextromethorphan (PROMETHAZINE -DM) 6.25-15 MG/5ML syrup Take 5 mLs by mouth 4 (four) times daily as needed. 118 mL 0   sertraline  (ZOLOFT ) 100 MG  tablet Take 1 tablet (100 mg total) by mouth daily. 30 tablet 5   Vitamin D , Ergocalciferol , (DRISDOL ) 1.25 MG (50000 UNIT) CAPS capsule Take 1 capsule (50,000 Units total) by mouth every 7 (seven) days. 20 capsule 1   budesonide -formoterol  (BREYNA ) 80-4.5 MCG/ACT inhaler Inhale 2 puffs into the lungs 2 (two) times daily.  10.2 g 3   No facility-administered medications prior to visit.    Allergies[1]  Review of Systems  Constitutional:  Negative for chills and fever.  HENT:  Positive for congestion. Negative for sore throat.   Eyes:  Negative for pain and discharge.  Respiratory:  Positive for cough, shortness of breath and wheezing.   Cardiovascular:  Negative for chest pain and palpitations.  Gastrointestinal:  Negative for abdominal pain, diarrhea, nausea and vomiting.  Endocrine: Negative for polydipsia and polyuria.  Genitourinary:  Negative for dysuria and hematuria.  Musculoskeletal:  Negative for neck pain and neck stiffness.  Skin:  Negative for rash.  Neurological:  Negative for dizziness and weakness.  Psychiatric/Behavioral:  Negative for agitation and behavioral problems.        Objective:    Physical Exam Vitals reviewed.  Constitutional:      General: She is not in acute distress.    Appearance: She is obese. She is not diaphoretic.  HENT:     Head: Normocephalic and atraumatic.     Nose: Nose normal.     Mouth/Throat:     Mouth: Mucous membranes are moist.  Eyes:     General: No scleral icterus.    Extraocular Movements: Extraocular movements intact.  Cardiovascular:     Rate and Rhythm: Normal rate and regular rhythm.     Heart sounds: Normal heart sounds. No murmur heard. Pulmonary:     Breath sounds: Wheezing (Mild, diffuse) present. No rales.  Musculoskeletal:     Cervical back: Neck supple. No tenderness.     Right lower leg: No edema.     Left lower leg: No edema.  Skin:    General: Skin is warm.     Findings: No rash.  Neurological:      General: No focal deficit present.     Mental Status: She is alert and oriented to person, place, and time.  Psychiatric:        Mood and Affect: Mood normal.        Behavior: Behavior normal.     BP 138/78 (BP Location: Left Arm)   Pulse 73   Ht 5' 3 (1.6 m)   Wt (!) 330 lb (149.7 kg)   LMP 08/30/2016   SpO2 96%   BMI 58.46 kg/m  Wt Readings from Last 3 Encounters:  05/15/24 (!) 330 lb (149.7 kg)  05/09/24 (!) 329 lb 12.8 oz (149.6 kg)  04/18/24 (!) 333 lb 3.2 oz (151.1 kg)        Assessment & Plan:   Problem List Items Addressed This Visit       Respiratory   Moderate asthma with exacerbation - Primary   Her current symptoms appear to be due to asthma exacerbation Solu-Medrol  125 mg IM today Medrol  Dosepak prescribed Started empiric azithromycin  for antibacterial coverage, has recently completed amoxicillin  Refilled Breyna , needs to use it regularly as maintenance inhaler Albuterol  inhaler or DuoNeb as needed for dyspnea or wheezing      Relevant Medications   azithromycin  (ZITHROMAX ) 250 MG tablet   methylPREDNISolone  (MEDROL  DOSEPAK) 4 MG TBPK tablet   budesonide -formoterol  (BREYNA ) 80-4.5 MCG/ACT inhaler     Meds ordered this encounter  Medications   azithromycin  (ZITHROMAX ) 250 MG tablet    Sig: Take 2 tablets on day 1, then 1 tablet daily on days 2 through 5    Dispense:  6 tablet    Refill:  0   methylPREDNISolone  (MEDROL  DOSEPAK) 4 MG TBPK tablet  Sig: Take as package instructions.    Dispense:  1 each    Refill:  0   budesonide -formoterol  (BREYNA ) 80-4.5 MCG/ACT inhaler    Sig: Inhale 2 puffs into the lungs 2 (two) times daily.    Dispense:  10.2 g    Refill:  3     Albie Arizpe MARLA Blanch, MD     [1]  Allergies Allergen Reactions   Fenoprofen Calcium     "

## 2024-05-15 NOTE — Assessment & Plan Note (Signed)
 Her current symptoms appear to be due to asthma exacerbation Solu-Medrol  125 mg IM today Medrol  Dosepak prescribed Started empiric azithromycin  for antibacterial coverage, has recently completed amoxicillin  Refilled Breyna , needs to use it regularly as maintenance inhaler Albuterol  inhaler or DuoNeb as needed for dyspnea or wheezing

## 2024-05-15 NOTE — Patient Instructions (Addendum)
 Please start taking azithromycin  and prednisone  as prescribed.  Please use albuterol  inhaler or DuoNeb nebulizer as needed for shortness of breath or wheezing.  Please use Breyna  regularly for asthma maintenance treatment.  Please contact if there is any cost concern.

## 2024-05-17 ENCOUNTER — Ambulatory Visit: Payer: Self-pay | Admitting: Family Medicine

## 2024-05-17 ENCOUNTER — Telehealth: Payer: Self-pay | Admitting: Adult Health

## 2024-05-17 ENCOUNTER — Ambulatory Visit: Payer: Self-pay | Admitting: Adult Health

## 2024-05-17 DIAGNOSIS — F4000 Agoraphobia, unspecified: Secondary | ICD-10-CM

## 2024-05-17 DIAGNOSIS — R1900 Intra-abdominal and pelvic swelling, mass and lump, unspecified site: Secondary | ICD-10-CM

## 2024-05-17 DIAGNOSIS — F332 Major depressive disorder, recurrent severe without psychotic features: Secondary | ICD-10-CM

## 2024-05-17 DIAGNOSIS — F411 Generalized anxiety disorder: Secondary | ICD-10-CM

## 2024-05-17 DIAGNOSIS — F4310 Post-traumatic stress disorder, unspecified: Secondary | ICD-10-CM

## 2024-05-17 NOTE — Telephone Encounter (Signed)
Left message to call me back about [place]

## 2024-05-17 NOTE — Telephone Encounter (Signed)
 FYI Only or Action Required?: Action required by provider: medication refill request.  Patient was last seen in primary care on 05/15/2024 by Tobie Suzzane POUR, MD.  Called Nurse Triage reporting Medication Refill.   Triage Disposition: Call PCP Now  Patient/caregiver understands and will follow disposition?: Yes                     Message from [place] J sent at 05/17/2024 10:12 AM EST  Reason for Triage: Patient is needing a refill on her medication used to help her with her mood swings and depression. She does not know the name of the medication and the none of the associated diagnosis on the scripts are lining up with treating depression. She said that she has been out for a whole day now and has already had a few mood swings.   Reason for Disposition  [1] Prescription refill request for ESSENTIAL medicine (i.e., likelihood of harm to patient if not taken) AND [2] triager unable to refill per department policy  Answer Assessment - Initial Assessment Questions 1. DRUG NAME: What medicine do you need to have refilled?     Sertraline  (Zoloft ) 100mg  once daily. Last dose 05/15/24  2. REFILLS REMAINING: How many refills are remaining? Notes: The label on the medicine or pill bottle will show how many refills are remaining. If there are no refills remaining, then a renewal may be needed.     0.  3. EXPIRATION DATE: What is the expiration date? Note: The label states when the prescription will expire, and thus can no longer be refilled.)     N/A.  4. PRESCRIBER: Who prescribed it? Note: The prescribing doctor or group is responsible for refill approvals..     Dr Barbra, she states he no longer works at [employer] and they don't have providers to help with depression anymore  5. PHARMACY: Have you contacted your pharmacy (drugstore)? Note: Some pharmacies will contact the doctor (or NP/PA).      Yes and she states they told her to reach out to her doctor.  6. SYMPTOMS:  Do you have any symptoms?     She states the medication helps with bipolar disorder, depression, mood swings. No suicidal thoughts or plans  Protocols used: Medication Refill and Renewal Call-A-AH

## 2024-05-20 ENCOUNTER — Telehealth: Payer: Self-pay | Admitting: Adult Health

## 2024-05-20 ENCOUNTER — Other Ambulatory Visit: Payer: Self-pay | Admitting: Family Medicine

## 2024-05-20 DIAGNOSIS — F4310 Post-traumatic stress disorder, unspecified: Secondary | ICD-10-CM

## 2024-05-20 DIAGNOSIS — F411 Generalized anxiety disorder: Secondary | ICD-10-CM

## 2024-05-20 DIAGNOSIS — F332 Major depressive disorder, recurrent severe without psychotic features: Secondary | ICD-10-CM

## 2024-05-20 DIAGNOSIS — F4000 Agoraphobia, unspecified: Secondary | ICD-10-CM

## 2024-05-20 MED ORDER — SERTRALINE HCL 100 MG PO TABS: 100.0000 mg | ORAL_TABLET | Freq: Every day | ORAL | 5 refills | Status: AC

## 2024-05-20 NOTE — Telephone Encounter (Signed)
 Pt aware of US  and get labs , and come in 05/23/24 at 11:50 am for endometrial biopsy with Dr Jayne

## 2024-05-22 LAB — OVARIAN MALIGNANCY RISK-ROMA
Cancer Antigen (CA) 125: 12.8 U/mL (ref 0.0–38.1)
HE4: 79.5 pmol/L (ref 0.0–105.2)
Postmenopausal ROMA: 1.58
Premenopausal ROMA: 1.94 — ABNORMAL HIGH

## 2024-05-22 LAB — POSTMENOPAUSAL INTERP: LOW

## 2024-05-22 LAB — PREMENOPAUSAL INTERP: HIGH

## 2024-05-23 ENCOUNTER — Ambulatory Visit (INDEPENDENT_AMBULATORY_CARE_PROVIDER_SITE_OTHER): Admitting: Obstetrics & Gynecology

## 2024-05-23 ENCOUNTER — Encounter: Payer: Self-pay | Admitting: Obstetrics & Gynecology

## 2024-05-23 ENCOUNTER — Ambulatory Visit: Admitting: Family Medicine

## 2024-05-23 ENCOUNTER — Other Ambulatory Visit (HOSPITAL_COMMUNITY)
Admission: RE | Admit: 2024-05-23 | Discharge: 2024-05-23 | Disposition: A | Source: Ambulatory Visit | Attending: Obstetrics & Gynecology | Admitting: Obstetrics & Gynecology

## 2024-05-23 ENCOUNTER — Ambulatory Visit: Payer: Self-pay

## 2024-05-23 VITALS — BP 169/99 | HR 78 | Wt 327.8 lb

## 2024-05-23 DIAGNOSIS — R9389 Abnormal findings on diagnostic imaging of other specified body structures: Secondary | ICD-10-CM | POA: Diagnosis present

## 2024-05-23 DIAGNOSIS — M79604 Pain in right leg: Secondary | ICD-10-CM | POA: Diagnosis not present

## 2024-05-23 DIAGNOSIS — N950 Postmenopausal bleeding: Secondary | ICD-10-CM

## 2024-05-23 DIAGNOSIS — R1900 Intra-abdominal and pelvic swelling, mass and lump, unspecified site: Secondary | ICD-10-CM

## 2024-05-23 MED ORDER — TRAMADOL HCL 50 MG PO TABS: 50.0000 mg | ORAL_TABLET | Freq: Three times a day (TID) | ORAL | 0 refills | Status: AC | PRN

## 2024-05-23 NOTE — Patient Instructions (Signed)
 Rest.  Heat.  Medication as directed.  Follow up with your PCP

## 2024-05-23 NOTE — Telephone Encounter (Signed)
 FYI Only or Action Required?: FYI only for provider: appointment scheduled on 1/22.  Patient was last seen in primary care on 05/15/2024 by Tobie Suzzane POUR, MD.  Called Nurse Triage reporting Leg Pain.  Symptoms began several days ago.  Interventions attempted: OTC medications: otc pain meds-didn't have bottle.  Symptoms are: gradually worsening.  Triage Disposition: See HCP Within 4 Hours (Or PCP Triage)  Patient/caregiver understands and will follow disposition?: Yes   Message from [place] T sent at 05/23/2024 11:24 AM EST  Summary: Right Leg pain   Reason for Triage: Pt reports she is having right leg pain, with difficulty with walking and lifting.  Pt reports no known injury.         Reason for Disposition  [1] SEVERE pain (e.g., excruciating, unable to do any normal activities) AND [2] not improved after 2 hours of pain medicine  Answer Assessment - Initial Assessment Questions Patient with history of lumbar radic affect RLE. Presenting with 3 days of right leg pain. Denies back pain at this time. Pain present below her hip more posteriorly. Unable to visualize the area and denies hot hard knot. Unsure redness. Pain 10/10 shooting/ pressure.  Hurts to walk, to lift her leg, and screams while having someone assist her getting dressed. Taking OTC pain meds with little benefit- unsure which one bc she doesn't have the bottle in front of her  Denies CP, SOB, Fever. Pain causing it to feel week. Denies color or temp change to the extremity.   Appt with Dr Bluford at [facility] this afternoon to assess. Advised ED/UC precautions and understood.   1. ONSET: When did the pain start?      3 days  2. LOCATION: Where is the pain located?      Right leg right below the hip more towards the back  3. PAIN: How bad is the pain?    (Scale 1-10; or mild, moderate, severe)     10/10 4. WORK OR EXERCISE: Has there been any recent work or exercise that involved this part of the body?       Denies  5. CAUSE: What do you think is causing the leg pain?     Unsure  6. OTHER SYMPTOMS: Do you have any other symptoms? (e.g., chest pain, back pain, breathing difficulty, swelling, rash, fever, numbness, weakness)     Painful to lift, walk  Protocols used: Leg Pain-A-AH

## 2024-05-23 NOTE — Addendum Note (Signed)
 Addended by: ILEAN RUTHERFORD HERO on: 05/23/2024 12:56 PM   Modules accepted: Orders

## 2024-05-23 NOTE — Telephone Encounter (Signed)
 Noted.

## 2024-05-23 NOTE — Progress Notes (Signed)
 Endometrial Biopsy Procedure Note  Pre-operative Diagnosis: Thickened endometrium, 9.5 mm,  no bleeding, but may need surgery for cyst so wanted to know the endometrial patholgy before making a surgical incision  Post-operative Diagnosis: same  Indications: postmenopausal bleeding  Procedure Details   Urine pregnancy test was not done.  The risks (including infection, bleeding, pain, and uterine perforation) and benefits of the procedure were explained to the patient and Written informed consent was obtained.  Antibiotic prophylaxis against endocarditis was not indicated.   The patient was placed in the dorsal lithotomy position.  Bimanual exam showed the uterus to be in the neutral position.  A Graves' speculum inserted in the vagina, and the cervix prepped with povidone iodine.  Endocervical curettage with a Kevorkian curette was not performed.   A sharp tenaculum was applied to the anterior lip of the cervix for stabilization.  A sterile uterine sound was used to sound the uterus to a depth of 6.5 cm.  A Pipelle endometrial aspirator was used to sample the endometrium.  Sample was sent for pathologic examination.  Condition: Stable  Complications: None  Plan:  The patient was advised to call for any fever or for prolonged or severe pain or bleeding. She was advised to use OTC analgesics as needed for mild to moderate pain. She was advised to avoid vaginal intercourse for 48 hours or until the bleeding has completely stopped.  Attending Physician Documentation: I was present for or performed the following: endometrial biopsy

## 2024-05-24 LAB — SURGICAL PATHOLOGY

## 2024-05-26 DIAGNOSIS — M79606 Pain in leg, unspecified: Secondary | ICD-10-CM | POA: Insufficient documentation

## 2024-05-26 NOTE — Assessment & Plan Note (Signed)
 Patient states that she cannot take NSAIDs.  This appears to be muscular in nature. Tramadol  as needed.

## 2024-05-26 NOTE — Progress Notes (Signed)
 "  Subjective:  Patient ID: Norma Horton, female    DOB: 01/02/1971  Age: 54 y.o. MRN: [id number]  CC:   Chief Complaint  Patient presents with   Leg Pain    3 days ago Pt stated pain meds not helping  10/10 pain Up near hip Right     HPI:  54 year old female presents for evaluation of the above.  Patient reports 3 day history of right posterior thigh pain.  No back pain. No radicular symptoms. Worsen with activity. No relieving factors.  No recent fall, trauma, or injury.  Social Hx   Social History   Socioeconomic History   Marital status: Divorced    Spouse name: Not on file   Number of children: 4   Years of education: Not on file   Highest education level: Not on file  Occupational History   Not on file  Tobacco Use   Smoking status: Never   Smokeless tobacco: Never  Vaping Use   Vaping status: Never Used  Substance and Sexual Activity   Alcohol use: Yes    Comment: occ   Drug use: No   Sexual activity: Yes    Birth control/protection: Surgical, Post-menopausal    Comment: tubal  Other Topics Concern   Not on file  Social History Narrative   Lives with her husband and her children    Social Drivers of Health   Tobacco Use: Low Risk (05/23/2024)   Patient History    Smoking Tobacco Use: Never    Smokeless Tobacco Use: Never    Passive Exposure: Not on file  Financial Resource Strain: Medium Risk (02/03/2022)   Overall Financial Resource Strain (CARDIA)    Difficulty of Paying Living Expenses: Somewhat hard  Food Insecurity: No Food Insecurity (08/25/2023)   Hunger Vital Sign    Worried About Running Out of Food in the Last Year: Never true    Ran Out of Food in the Last Year: Never true  Transportation Needs: No Transportation Needs (08/25/2023)   PRAPARE - Administrator, Civil Service (Medical): No    Lack of Transportation (Non-Medical): No  Physical Activity: Inactive (02/03/2022)   Exercise Vital Sign    Days of Exercise per Week: 0  days    Minutes of Exercise per Session: 0 min  Stress: Stress Concern Present (02/03/2022)   [name]    Feeling of Stress : Very much  Social Connections: Moderately Isolated (02/03/2022)   Social Connection and Isolation Panel    Frequency of Communication with Friends and Family: More than three times a week    Frequency of Social Gatherings with Friends and Family: Twice a week    Attends Religious Services: Never    Database Administrator or Organizations: No    Attends Banker Meetings: Never    Marital Status: Married  Depression (PHQ2-9): Medium Risk (02/08/2024)   Depression (PHQ2-9)    PHQ-2 Score: 9  Alcohol Screen: Low Risk (10/28/2022)   Alcohol Screen    Last Alcohol Screening Score (AUDIT): 1  Housing: Low Risk (08/25/2023)   Housing Stability Vital Sign    Unable to Pay for Housing in the Last Year: No    Number of Times Moved in the Last Year: 0    Homeless in the Last Year: No  Utilities: Not At Risk (08/25/2023)   AHC Utilities    Threatened with loss of utilities: No  Health Literacy: Not on file    Review of Systems Per HPI  Objective:  BP (!) 144/86   Temp (!) 97.5 F (36.4 C)   Ht 5' 3 (1.6 m)   Wt (!) 327 lb 6.4 oz (148.5 kg)   LMP 08/30/2016   SpO2 95%   BMI 58.00 kg/m      05/23/2024    3:35 PM 05/23/2024   11:57 AM 05/23/2024   11:50 AM  BP/Weight  Systolic BP 144 169 161  Diastolic BP 86 99 98  Wt. (Lbs) 327.4  327.8  BMI 58 kg/m2  58.07 kg/m2    Physical Exam Vitals and nursing note reviewed.  Constitutional:      General: She is not in acute distress.    Appearance: Normal appearance. She is obese.  HENT:     Head: Normocephalic and atraumatic.  Eyes:     General:        Right eye: No discharge.        Left eye: No discharge.     Conjunctiva/sclera: Conjunctivae normal.  Pulmonary:     Effort: Pulmonary effort is normal. No respiratory distress.   Musculoskeletal:       Legs:     Comments: Localized tenderness at the labeled location.  Neurological:     Mental Status: She is alert.  Psychiatric:        Mood and Affect: Mood normal.        Behavior: Behavior normal.     Lab Results  Component Value Date   WBC 7.6 04/18/2024   HGB 14.7 04/18/2024   HCT 44.5 04/18/2024   PLT 305 04/18/2024   GLUCOSE 148 (H) 01/24/2024   CHOL 191 01/24/2024   TRIG 133 01/24/2024   HDL 56 01/24/2024   LDLCALC 112 (H) 01/24/2024   ALT 54 (H) 01/24/2024   AST 62 (H) 01/24/2024   NA 138 01/24/2024   K 4.3 01/24/2024   CL 96 01/24/2024   CREATININE 0.80 03/13/2024   BUN 16 01/24/2024   CO2 22 01/24/2024   TSH 130.000 (H) 04/12/2024   HGBA1C 7.2 (A) 11/13/2023     Assessment & Plan:  Pain of right lower extremity Assessment & Plan: Patient states that she cannot take NSAIDs.  This appears to be muscular in nature. Tramadol  as needed.   Orders: -     traMADol  HCl; Take 1 tablet (50 mg total) by mouth every 8 (eight) hours as needed for severe pain (pain score 7-10).  Dispense: 15 tablet; Refill: 0    Follow-up:  Return if symptoms worsen or fail to improve.  Jacqulyn Ahle DO [facility] "

## 2024-05-28 ENCOUNTER — Ambulatory Visit: Admitting: "Endocrinology

## 2024-06-03 ENCOUNTER — Ambulatory Visit: Admitting: "Endocrinology

## 2024-06-03 ENCOUNTER — Encounter: Payer: Self-pay | Admitting: *Deleted

## 2024-06-04 ENCOUNTER — Telehealth: Admitting: Obstetrics & Gynecology

## 2024-06-07 ENCOUNTER — Ambulatory Visit: Admitting: "Endocrinology

## 2024-06-07 ENCOUNTER — Encounter: Payer: Self-pay | Admitting: "Endocrinology

## 2024-06-07 VITALS — BP 134/88 | HR 64 | Ht 63.0 in | Wt 328.6 lb

## 2024-06-07 DIAGNOSIS — C73 Malignant neoplasm of thyroid gland: Secondary | ICD-10-CM

## 2024-06-07 DIAGNOSIS — E890 Postprocedural hypothyroidism: Secondary | ICD-10-CM

## 2024-06-07 DIAGNOSIS — E119 Type 2 diabetes mellitus without complications: Secondary | ICD-10-CM

## 2024-06-07 DIAGNOSIS — E559 Vitamin D deficiency, unspecified: Secondary | ICD-10-CM

## 2024-06-07 MED ORDER — LEVOTHYROXINE SODIUM 200 MCG PO TABS: 200.0000 ug | ORAL_TABLET | Freq: Every day | ORAL | 1 refills | Status: AC

## 2024-06-07 NOTE — Progress Notes (Signed)
 "                                       06/07/2024    Endocrinology follow-up note  Subjective:    Patient ID: Norma Horton, female    DOB: [birth year], PCP Bacchus, Meade PEDLAR, FNP   Past Medical History:  Diagnosis Date   Anxiety    Asthma    Depression    Pt has had suicide ideations in the past.   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   Past Surgical History:  Procedure Laterality Date   BIOPSY THYROID   03/2023   CHOLECYSTECTOMY     THYROIDECTOMY N/A 08/25/2023   Procedure: TOTAL THYROIDECTOMY;  Surgeon: Mavis Anes, MD;  Location: [facility];  Service: General;  Laterality: N/A;  TOTAL   TUBAL LIGATION     Social History   Socioeconomic History   Marital status: Divorced    Spouse name: Not on file   Number of children: 4   Years of education: Not on file   Highest education level: Not on file  Occupational History   Not on file  Tobacco Use   Smoking status: Never   Smokeless tobacco: Never  Vaping Use   Vaping status: Never Used  Substance and Sexual Activity   Alcohol use: Yes    Comment: occ   Drug use: No   Sexual activity: Yes    Birth control/protection: Surgical, Post-menopausal    Comment: tubal  Other Topics Concern   Not on file  Social History Narrative   Lives with her husband and her children    Social Drivers of Health   Tobacco Use: Low Risk (06/07/2024)   Patient History    Smoking Tobacco Use: Never    Smokeless Tobacco Use: Never    Passive Exposure: Not on file  Financial Resource Strain: Medium Risk (02/03/2022)   Overall Financial Resource Strain (CARDIA)    Difficulty of Paying Living Expenses: Somewhat hard  Food Insecurity: No Food Insecurity (08/25/2023)   Hunger Vital Sign    Worried About Running Out of Food in the Last Year: Never true    Ran Out of Food in the Last Year: Never true  Transportation Needs: No Transportation Needs (08/25/2023)    PRAPARE - Administrator, Civil Service (Medical): No    Lack of Transportation (Non-Medical): No  Physical Activity: Inactive (02/03/2022)   Exercise Vital Sign    Days of Exercise per Week: 0 days    Minutes of Exercise per Session: 0 min  Stress: Stress Concern Present (02/03/2022)   [name]    Feeling of Stress : Very much  Social Connections: Moderately Isolated (02/03/2022)   Social Connection and Isolation Panel    Frequency of Communication with Friends and Family: More than three times a week    Frequency of Social Gatherings with Friends and Family: Twice a week    Attends Religious Services: Never    Database Administrator or Organizations: No    Attends Banker Meetings: Never    Marital Status: Married  Depression (PHQ2-9): Medium Risk (02/08/2024)   Depression (PHQ2-9)    PHQ-2 Score: 9  Alcohol Screen: Low Risk (10/28/2022)  Alcohol Screen    Last Alcohol Screening Score (AUDIT): 1  Housing: Low Risk (08/25/2023)   Housing Stability Vital Sign    Unable to Pay for Housing in the Last Year: No    Number of Times Moved in the Last Year: 0    Homeless in the Last Year: No  Utilities: Not At Risk (08/25/2023)   AHC Utilities    Threatened with loss of utilities: No  Health Literacy: Not on file   Family History  Problem Relation Age of Onset   Breast cancer Maternal Grandmother    Heart attack Father    Hypertension Father    COPD Mother    Diabetes Mellitus II Brother    Throat cancer Brother    Cerebral aneurysm Sister    Cervical cancer Sister    Cervical cancer Sister    Heart attack Sister    Diabetes Daughter    Asthma Daughter    Cystic fibrosis Daughter    Hypertension Other    Outpatient Encounter Medications as of 06/07/2024  Medication Sig   albuterol  (VENTOLIN  HFA) 108 (90 Base) MCG/ACT inhaler INHALE 2 PUFFS BY MOUTH EVERY 6 HOURS AS NEEDED FOR WHEEZING AND  FOR SHORTNESS OF BREATH   budesonide -formoterol  (BREYNA ) 80-4.5 MCG/ACT inhaler Inhale 2 puffs into the lungs 2 (two) times daily.   calcium  carbonate (OS-CAL - DOSED IN MG OF ELEMENTAL CALCIUM ) 1250 (500 Ca) MG tablet Take 1 tablet (1,250 mg total) by mouth 2 (two) times daily with a meal.   chlorthalidone  (HYGROTON ) 25 MG tablet Take 1 tablet by mouth once daily   Cyanocobalamin (B-12 PO) Take 1 tablet by mouth daily.   Ferrous Sulfate (IRON PO) Take by mouth.   ipratropium-albuterol  (DUONEB) 0.5-2.5 (3) MG/3ML SOLN    levothyroxine  (SYNTHROID ) 200 MCG tablet Take 1 tablet (200 mcg total) by mouth daily before breakfast.   metFORMIN  (GLUCOPHAGE ) 1000 MG tablet Take 1 tablet (1,000 mg total) by mouth 2 (two) times daily with a meal.   montelukast  (SINGULAIR ) 10 MG tablet Take 1 tablet (10 mg total) by mouth at bedtime.   pantoprazole  (PROTONIX ) 40 MG tablet Take 1 tablet by mouth once daily   promethazine -dextromethorphan (PROMETHAZINE -DM) 6.25-15 MG/5ML syrup Take 5 mLs by mouth 4 (four) times daily as needed.   sertraline  (ZOLOFT ) 100 MG tablet Take 1 tablet (100 mg total) by mouth daily.   traMADol  (ULTRAM ) 50 MG tablet Take 1 tablet (50 mg total) by mouth every 8 (eight) hours as needed for severe pain (pain score 7-10).   Vitamin D , Ergocalciferol , (DRISDOL ) 1.25 MG (50000 UNIT) CAPS capsule Take 1 capsule (50,000 Units total) by mouth every 7 (seven) days.   [DISCONTINUED] levothyroxine  (SYNTHROID ) 175 MCG tablet Take 1 tablet (175 mcg total) by mouth daily before breakfast.   No facility-administered encounter medications on file as of 06/07/2024.   ALLERGIES: Allergies  Allergen Reactions   Fenoprofen Calcium      VACCINATION STATUS: Immunization History  Administered Date(s) Administered   Tdap 05/23/1998, 11/16/2021   Zoster Recombinant(Shingrix ) 01/21/2022, 04/22/2022    HPI Norma Horton is 54 y.o. female who presents today with a medical history as above. she is being  seen in follow-up after she was seen in consultation for multinodular goiter requested by Edman Meade PEDLAR, FNP. She has history of multinodular goiter dating back to 2017.   After fine-needle aspiration biopsy of thyroid  nodule showed malignant results, she was sent for surgical treatment.  Total thyroidectomy which revealed 0.9 cm  classic carcinoma lymphovascular invasion.  However, tumor was present at inked edge. She is status post total thyroidectomy for thyroid  malignancy on August 25, 2023. See notes from previous visit.    She was given 68.9 mCi of I-131 on December 08, 2023 with posttherapy whole-body scan on December 18, 2023 which showed intense uptake within the thyroid  bed, findings most consistent with thyroid  remnant within the thyroid  bed, could not completely exclude local adenopathy.  No evidence of distant metastasis.  She did have detectable thyroglobulin on December 08, 2023.    Because of these findings, she was offered thyroid /neck ultrasound before this visit which showed postsurgical changes after total thyroidectomy.  Approximately 2 cm hypoechoic solid mass in the right hemithyroidectomy surgical bed concerning for residual/recurrent thyroid  malignancy.  No cervical lymphadenopathy.  She was sent for fine-needle aspiration biopsy of this lesion which unfortunately did not reveal any diagnostic material.  See below.  Her most recent unstimulated thyroglobulin is less than 2.0 ng/mL. She presents with improvement in her previously noted dysphagia, occasional shortness of breath, and dysphonia.  Previsit Thyrogen  stimulated whole-body scan completed on April 15, 2024 showed no evidence of cancer recurrence no distant metastasis-see below.  She is currently on levothyroxine  175 mcg p.o. daily before breakfast.   Her previsit thyroid  function tests are consistent with appropriate suppressive therapy.      She has no family history of thyroid  dysfunction or thyroid   malignancy.  Her other medical problems include type 2 diabetes on metformin  1000 mg p.o. twice daily.  Her point-of-care A1c today is 7.2%, stable compared to her previous presentations.  She has hypertension on treatment with hydrochlorothiazide .   Review of Systems  Constitutional:  No recent weight change, no fatigue, no subjective hyperthermia, no subjective hypothermia Eyes: no blurry vision, no xerophthalmia    Objective:       06/07/2024   12:18 PM 05/23/2024    3:35 PM 05/23/2024   11:57 AM  Vitals with BMI  Height 5' 3 5' 3   Weight 328 lbs 10 oz 327 lbs 6 oz   BMI 58.22 58.01   Systolic 134 144 830  Diastolic 88 86 99  Pulse 64      BP 134/88   Pulse 64   Ht 5' 3 (1.6 m)   Wt (!) 328 lb 9.6 oz (149.1 kg)   LMP 08/30/2016   BMI 58.21 kg/m   Wt Readings from Last 3 Encounters:  06/07/24 (!) 328 lb 9.6 oz (149.1 kg)  05/23/24 (!) 327 lb 6.4 oz (148.5 kg)  05/23/24 (!) 327 lb 12.8 oz (148.7 kg)    Physical Exam  Constitutional:  Body mass index is 58.21 kg/m.,  not in acute distress, normal state of mind Eyes: PERRLA, EOMI, no exophthalmos ENT: moist mucous membranes, + gross thyromegaly, no gross cervical lymphadenopathy   CMP ( most recent) CMP     Component Value Date/Time   NA 138 01/24/2024 0947   K 4.3 01/24/2024 0947   CL 96 01/24/2024 0947   CO2 22 01/24/2024 0947   GLUCOSE 148 (H) 01/24/2024 0947   GLUCOSE 145 (H) 08/27/2023 0441   BUN 16 01/24/2024 0947   CREATININE 0.80 03/13/2024 0930   CALCIUM  10.0 01/24/2024 0947   PROT 7.6 01/24/2024 0947   ALBUMIN 4.0 01/24/2024 0947   AST 62 (H) 01/24/2024 0947   ALT 54 (H) 01/24/2024 0947   ALKPHOS 100 01/24/2024 0947   BILITOT 0.8 01/24/2024 0947   GFRNONAA >60 08/27/2023  9558   GFRAA >60 11/06/2010 2348    Thyroid  ultrasound on November 19, 2015 FINDINGS: Right thyroid  lobe   Measurements: 6.8 x 3.8 x 4.5 cm. Right upper pole predominately solid nodule measures 4.9 x 3.7 x 4.5 cm    Left thyroid  lobe : Measurements: 4.9 x 1.5 x 1.4 cm. Heterogeneous tissue without focal  nodule.   Isthmus : Thickness: 0.3 cm.  No nodules visualized.   Lymphadenopathy: None visualized.   IMPRESSION: Solitary large right lobe nodule measures 4.9 cm. Findings meet consensus criteria for biopsy. Ultrasound-guided fine needle aspiration should be considered, as per the consensus statement: Management of Thyroid  Nodules Detected at US : Society of Radiologists in Ultrasound Consensus Conference Statement. Radiology 2005; [id number].  Final needle aspiration biopsy of thyroid  nodules in August 2017 COMMENT: THE SPECIMEN IS HYPERCELLULAR AND CONSISTS OF SMALL AND MEDIUM SIZED GROUPS OF FOLLICULAR  EPITHELIAL CELLS WITH CYTOLOGIC ATYPIA, INCLUDING NUCLEAR ENLARGEMENT AND OVERLAP.    Patient did not want surgery.  Repeat thyroid  ultrasound was performed on January 21, 2022 showing   Isthmus: 0.3 cm ,previously 0.3 cm   Right lobe: 7.7 x 3.9 x 4.7 cm ,previously 6.8 x 3.8 x 4.5 cm,    Left lobe: 5.2 x 1.1 x 1.5 cm ,previously 4.9 x 1.5 x 1.4 cm  Nodule in the right lobe of her thyroid  size: 6.4 cm; Other 2 dimensions: 4.8 x 3.8 cm, previously,  4.9 x 3.7 x 4.5 cm  FNA 03/20/23 Clinical History: 6.4 cm RML; TI-RADS risk category: TR4 (4-6 pts.)  Specimen Submitted:  A. THYROID , RML, FINE NEEDLE ASPIRATION:    FINAL MICROSCOPIC DIAGNOSIS:  - Atypia of undetermined significance (Bethesda category III)   Afirma: GSC Classifier Candia Classifier - Suspicious ( ~ 50% risk of malignancy).   Thyroidectomy on August 25, 2023 FINAL MICROSCOPIC DIAGNOSIS:   A. THYROID , LEFT, LOBECTOMY:  - Thyroid  follicular hyperplasia/adenomatous nodule  - No malignancy identified   B. THYROID , RIGHT, LOBECTOMY:  - Papillary thyroid  carcinoma, classic type, 0.9 cm  - Tumor is present at inked edge  - No lymphovascular invasion identified  - Thyroid  follicular nodular disease/adenomatous  nodules  - See oncology table    Thyrogen  stimulated remnant ablation utilizing 66.9 mCi of I-131 December 18, 2023 FINDINGS: Intense uptake within the thyroid  bed. No evidence abnormal radiotracer outside of the thyroid  bed.   IMPRESSION: 1. Findings most consistent with thyroid  remnant within the thyroid  bed. Cannot completely exclude local adenopathy. Recommend following thyroglobulin levels. 2. No evidence of metastatic disease.   Thyroid /neck ultrasound on January 05, 2024 FINDINGS: Postsurgical changes after total thyroidectomy. Within the right hemi thyroid  surgical bed is a lobular, hypoechoic solid mass measuring approximately 2.0 x 1.6 x 1.0 cm. No evidence of cervical lymphadenopathy.   IMPRESSION: 1. Postsurgical changes after total thyroidectomy. 2. Approximately 2 cm hypoechoic solid mass in the right hemithyroidectomy surgical bed, concerning for residual/recurrent thyroid  malignancy. 3. No cervical lymphadenopathy.   Fine-needle aspiration biopsy of this lesion on March 22, 2024  FINAL MICROSCOPIC DIAGNOSIS:  - Nondiagnostic material   SPECIMEN ADEQUACY:  INSUFFICIENT FOR DIAGNOSIS. The specimen is processed and examined  microscopically, but found to be UNSATISFACTORY for evaluation.   Thyrogen  stimulated whole-body scan April 15, 2024 EXAM: WHOLE BODY IODINE SCAN 04/15/2024 11:32:00 AM   TECHNIQUE: Radioiodine was given by mouth. Anterior and posterior whole body images were obtained  days later.   RADIOPHARMACEUTICAL: 4.1 mCi I-131   COMPARISON: None available.   CLINICAL HISTORY: 54 year old  female with papillary thyroid  carcinoma. T1a NX disease. Positive inked margin. Low to intermediate risk papillary thyroid  carcinoma. Post Thyroid  remnant ablation.   FINDINGS:   No clear significant right facet activity within the thyroid  bed. Trace activity just left of midline may represent esophageal activity. Normal activity in the  subarachnoid glands noted. Normal activity within the GI and GU tract. Physiologic activity is present.   IMPRESSION: 1. No clear scintigraphic evidence of thyroid  cancer recurrence. 2. No iodine-avid metastatic disease.   Electronically signed by: Norleen Boxer MD 04/15/2024 07:01 PM EST RP Workstation: [id number]   Diabetic Labs (most recent): Lab Results  Component Value Date   HGBA1C 7.2 (A) 11/13/2023   HGBA1C 7.0 (H) 08/23/2023   HGBA1C 7.1 (H) 03/03/2023     Lab Results  Component Value Date   TSH 130.000 (H) 04/12/2024   TSH 0.187 (L) 03/22/2024   TSH 0.337 (L) 01/24/2024   TSH 119.250 (H) 12/08/2023   TSH 10.000 (H) 11/02/2023   TSH 2.494 08/23/2023   TSH 3.280 03/03/2023   TSH 3.390 03/03/2023   TSH 2.920 10/31/2022   TSH 3.640 08/10/2022   FREET4 0.97 04/12/2024   FREET4 2.03 (H) 03/22/2024   FREET4 1.75 01/24/2024   FREET4 1.07 12/08/2023   FREET4 1.33 11/02/2023   FREET4 1.17 03/03/2023   FREET4 1.16 03/03/2023   FREET4 1.15 10/31/2022   FREET4 1.22 08/10/2022   FREET4 1.28 07/25/2022      Assessment & Plan:   1.  Classic papillary thyroid   malignancy   2.  Postsurgical hypothyroidism  3.  Morbid obesity   4  Hypertension   5.  Type 2 diabetes  6. Vitamin D  deficiency   - Based on these reviews, she has history of nodular goiter, growing overall, and growing nodule on the right lobe.  This right lobe nodule was previously biopsied with bethesda 3 findings in 2017.  After she was approached for a repeat biopsy of a 6.4 cm nodule in July 2024, she opted to avoid it.  She was re-approached due to the fact that she reported occasional compressive symptoms. Hide biopsy results on March 20, 2023 was consistent with atypia of undetermined significance.  A sample was sent for molecular testing with Afirma gene sequencing classifier which showed approximately 50% risk of malignancy. This postsurgery which revealed 0.9 cm classic PTC with no lymphovascular  invasion, however present at the inked edge.  I had a long discussion with her about treatment course and expectations for differentiated thyroid  cancer. She was given 68.9 mCi of I-131 for thyroid  remnant ablation followed by whole-body scan. Posttherapy whole-body scan revealed possible thyroid  remnant, could not rule out local adenopathy, no evidence of distant metastasis.  Her stimulated thyroglobulin was 7.4 prior to her last visit. Her previsit thyroid /neck ultrasound reveals 2 cm hypoechoic solid mass in the right thyroid  bed.  - She will be sent for fine-needle aspiration biopsy of this lesion was not diagnostic.   Procedure did not produce sufficient diagnostic material.  Her previsit labs showed undetectable thyroglobulin levels.  .  This puts her in the category of indeterminate treatment response.  She would be considered for Thyrogen  stimulated whole-body scan before her next visit.   Her previsit Thyrogen  stimulated whole-body scan completed on April 15, 2024 was unremarkable did not show evidence of tumor recurrence no distant metastasis-see above.  She will be kept on expectant management , may need a follow-up CT neck in 3-6 months. - For her postsurgical hypothyroidism,  she would benefit from a higher dose of levothyroxine .  I discussed and increase levothyroxine  to 200 mcg p.o. daily before breakfast.   - We discussed about the correct intake of her thyroid  hormone, on empty stomach at fasting, with water , separated by at least 30 minutes from breakfast and other medications,  and separated by more than 4 hours from calcium , iron, multivitamins, acid reflux medications (PPIs). -Patient is made aware of the fact that thyroid  hormone replacement is needed for life, dose to be adjusted by periodic monitoring of thyroid  function tests.  -For her diabetes type 2 complicated by morbid obesity, she will benefit from GLP-1 receptor agonists which she will be considered for after  completion of her thyroid  cancer treatment.  Her BMI is 58. 21  kg/m.  She is a good candidate for bariatric surgery, will be discussed on subsequent visits. In the meantime, she is advised to continue metformin  1000 mg p.o. twice daily.  Her recent A1c was 7.2%.     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  She is under GI evaluation and workup.    - Her blood pressure was controlled to target.  She is advised to continue chlorthalidone  25 mg p.o. daily at breakfast.  She is also encouraged to continue on her vitamin D2 50,000 units weekly.   - she is advised to maintain close follow up with Bacchus, Meade PEDLAR, FNP for primary care needs.   I spent  26  minutes in the care of the patient today including review of labs from Thyroid  Function, CMP, and other relevant labs ; imaging/biopsy records (current and previous including abstractions from other facilities); face-to-face time discussing  her lab results and symptoms, medications doses, her options of short and long term treatment based on the latest standards of care / guidelines;   and documenting the encounter.  Norma Horton  participated in the discussions, expressed understanding, and voiced agreement with the above plans.  All questions were answered to her satisfaction. she is encouraged to contact clinic should she have any questions or concerns prior to her return visit. Dear Patient: Feel free to review your progress notes.  If you are reviewing this progress note and have questions about the meaning of /or medical terms being used, please make a note and address it at your next follow-up appointment.  Medical notes are meant to be a communication tool between medical professionals and require medical terms to be used for efficiency and insurance approval.    Follow up plan: Return in about 3 months (around 09/04/2024) for F/U with Pre-visit Labs, A1c  -NV.   Ranny Earl, MD [facility] [address] [place], [place] [address] Phone: [phone]  Fax: [phone]     06/07/2024, 12:58 PM  This note was partially dictated with voice recognition software. Similar sounding words can be transcribed inadequately or may not  be corrected upon review.  "

## 2024-06-14 ENCOUNTER — Ambulatory Visit: Admitting: Family Medicine

## 2024-07-12 ENCOUNTER — Ambulatory Visit: Payer: Self-pay | Admitting: Family Medicine

## 2024-09-04 ENCOUNTER — Ambulatory Visit: Admitting: "Endocrinology
